# Patient Record
Sex: Female | Born: 1950 | Race: White | Hispanic: No | Smoking: Never smoker
Health system: Southern US, Community
[De-identification: ages and names within clinical notes are randomized; demographics above are authoritative.]

## PROBLEM LIST (undated history)

## (undated) DIAGNOSIS — Z8719 Personal history of other diseases of the digestive system: Secondary | ICD-10-CM

## (undated) DIAGNOSIS — F1190 Opioid use, unspecified, uncomplicated: Secondary | ICD-10-CM

## (undated) DIAGNOSIS — B269 Mumps without complication: Secondary | ICD-10-CM

## (undated) DIAGNOSIS — B019 Varicella without complication: Secondary | ICD-10-CM

## (undated) DIAGNOSIS — I509 Heart failure, unspecified: Secondary | ICD-10-CM

## (undated) DIAGNOSIS — T8859XA Other complications of anesthesia, initial encounter: Secondary | ICD-10-CM

## (undated) DIAGNOSIS — M069 Rheumatoid arthritis, unspecified: Secondary | ICD-10-CM

## (undated) DIAGNOSIS — E785 Hyperlipidemia, unspecified: Secondary | ICD-10-CM

## (undated) DIAGNOSIS — F32A Depression, unspecified: Secondary | ICD-10-CM

## (undated) DIAGNOSIS — L719 Rosacea, unspecified: Secondary | ICD-10-CM

## (undated) DIAGNOSIS — B059 Measles without complication: Secondary | ICD-10-CM

## (undated) DIAGNOSIS — M1990 Unspecified osteoarthritis, unspecified site: Secondary | ICD-10-CM

## (undated) DIAGNOSIS — F419 Anxiety disorder, unspecified: Secondary | ICD-10-CM

## (undated) DIAGNOSIS — N189 Chronic kidney disease, unspecified: Secondary | ICD-10-CM

## (undated) DIAGNOSIS — K259 Gastric ulcer, unspecified as acute or chronic, without hemorrhage or perforation: Secondary | ICD-10-CM

## (undated) DIAGNOSIS — K219 Gastro-esophageal reflux disease without esophagitis: Secondary | ICD-10-CM

## (undated) DIAGNOSIS — G709 Myoneural disorder, unspecified: Secondary | ICD-10-CM

## (undated) DIAGNOSIS — E039 Hypothyroidism, unspecified: Secondary | ICD-10-CM

## (undated) DIAGNOSIS — G2581 Restless legs syndrome: Secondary | ICD-10-CM

## (undated) DIAGNOSIS — M5030 Other cervical disc degeneration, unspecified cervical region: Secondary | ICD-10-CM

## (undated) DIAGNOSIS — I10 Essential (primary) hypertension: Secondary | ICD-10-CM

## (undated) DIAGNOSIS — T402X5A Adverse effect of other opioids, initial encounter: Secondary | ICD-10-CM

## (undated) DIAGNOSIS — M329 Systemic lupus erythematosus, unspecified: Secondary | ICD-10-CM

## (undated) DIAGNOSIS — M810 Age-related osteoporosis without current pathological fracture: Secondary | ICD-10-CM

## (undated) DIAGNOSIS — Z9842 Cataract extraction status, left eye: Secondary | ICD-10-CM

## (undated) DIAGNOSIS — G894 Chronic pain syndrome: Secondary | ICD-10-CM

## (undated) DIAGNOSIS — Z9841 Cataract extraction status, right eye: Secondary | ICD-10-CM

## (undated) DIAGNOSIS — M3500 Sicca syndrome, unspecified: Secondary | ICD-10-CM

## (undated) HISTORY — PX: CHOLECYSTECTOMY: SHX55

## (undated) HISTORY — PX: HERNIA REPAIR: SHX51

## (undated) HISTORY — PX: EYE SURGERY: SHX253

## (undated) HISTORY — PX: OTHER SURGICAL HISTORY: SHX169

## (undated) HISTORY — DX: Rosacea, unspecified: L71.9

## (undated) HISTORY — PX: COLONOSCOPY: SHX174

## (undated) HISTORY — DX: Heart failure, unspecified: I50.9

## (undated) HISTORY — PX: CATARACT EXTRACTION: SUR2

## (undated) HISTORY — PX: UPPER GASTROINTESTINAL ENDOSCOPY: SHX188

## (undated) HISTORY — PX: JOINT REPLACEMENT: SHX530

## (undated) HISTORY — PX: ENDOVENOUS ABLATION SAPHENOUS VEIN W/ LASER: SUR449

## (undated) HISTORY — PX: HIP ARTHROPLASTY: SHX981

## (undated) HISTORY — PX: KNEE ARTHROPLASTY: SHX992

## (undated) HISTORY — PX: CHOLESTEATOMA EXCISION: SHX1345

---

## 1973-11-09 HISTORY — PX: TUBAL LIGATION: SHX77

## 1976-11-09 HISTORY — PX: COMBINED AUGMENTATION MAMMAPLASTY AND ABDOMINOPLASTY: SUR291

## 2002-11-09 HISTORY — PX: BREAST ENHANCEMENT SURGERY: SHX7

## 2005-11-09 HISTORY — PX: LAPAROSCOPIC INCISIONAL / UMBILICAL / VENTRAL HERNIA REPAIR: SUR789

## 2006-11-09 HISTORY — PX: LIMB SPARING RESECTION HIP W/ SADDLE JOINT REPLACEMENT: SUR829

## 2007-11-10 HISTORY — PX: NASAL ENDOSCOPY: SHX286

## 2008-11-09 HISTORY — PX: KNEE ARTHROSCOPY: SUR90

## 2020-01-17 DIAGNOSIS — M810 Age-related osteoporosis without current pathological fracture: Secondary | ICD-10-CM | POA: Insufficient documentation

## 2020-01-17 DIAGNOSIS — G629 Polyneuropathy, unspecified: Secondary | ICD-10-CM | POA: Insufficient documentation

## 2020-01-17 DIAGNOSIS — L930 Discoid lupus erythematosus: Secondary | ICD-10-CM | POA: Insufficient documentation

## 2020-09-06 ENCOUNTER — Emergency Department
Admission: EM | Admit: 2020-09-06 | Discharge: 2020-09-07 | Disposition: A | Discharge status: Home / Self Care | Payer: Medicare Other | Attending: Emergency Medicine | Admitting: Emergency Medicine

## 2020-09-06 ENCOUNTER — Emergency Department: Payer: Medicare Other

## 2020-09-06 ENCOUNTER — Other Ambulatory Visit: Payer: Self-pay

## 2020-09-06 DIAGNOSIS — I10 Essential (primary) hypertension: Secondary | ICD-10-CM | POA: Insufficient documentation

## 2020-09-06 DIAGNOSIS — R109 Unspecified abdominal pain: Secondary | ICD-10-CM | POA: Diagnosis not present

## 2020-09-06 DIAGNOSIS — N939 Abnormal uterine and vaginal bleeding, unspecified: Secondary | ICD-10-CM | POA: Insufficient documentation

## 2020-09-06 LAB — CBC
HCT: 39.8 % (ref 36.0–46.0)
Hemoglobin: 13.1 g/dL (ref 12.0–15.0)
MCH: 30.7 pg (ref 26.0–34.0)
MCHC: 32.9 g/dL (ref 30.0–36.0)
MCV: 93.2 fL (ref 80.0–100.0)
Platelets: 257 10*3/uL (ref 150–400)
RBC: 4.27 MIL/uL (ref 3.87–5.11)
RDW: 14.2 % (ref 11.5–15.5)
WBC: 6.6 10*3/uL (ref 4.0–10.5)
nRBC: 0 % (ref 0.0–0.2)

## 2020-09-06 LAB — CBC WITH DIFFERENTIAL/PLATELET
Abs Immature Granulocytes: 0 10*3/uL (ref 0.00–0.07)
Basophils Absolute: 0.1 10*3/uL (ref 0.0–0.1)
Basophils Relative: 1 %
Eosinophils Absolute: 0.1 10*3/uL (ref 0.0–0.5)
Eosinophils Relative: 2 %
HCT: 40.9 % (ref 36.0–46.0)
Hemoglobin: 13.3 g/dL (ref 12.0–15.0)
Immature Granulocytes: 0 %
Lymphocytes Relative: 26 %
Lymphs Abs: 1.7 10*3/uL (ref 0.7–4.0)
MCH: 30.6 pg (ref 26.0–34.0)
MCHC: 32.5 g/dL (ref 30.0–36.0)
MCV: 94 fL (ref 80.0–100.0)
Monocytes Absolute: 0.3 10*3/uL (ref 0.1–1.0)
Monocytes Relative: 4 %
Neutro Abs: 4.6 10*3/uL (ref 1.7–7.7)
Neutrophils Relative %: 67 %
Platelets: 227 10*3/uL (ref 150–400)
RBC: 4.35 MIL/uL (ref 3.87–5.11)
RDW: 14.3 % (ref 11.5–15.5)
WBC: 6.7 10*3/uL (ref 4.0–10.5)
nRBC: 0 % (ref 0.0–0.2)

## 2020-09-06 LAB — COMPREHENSIVE METABOLIC PANEL
ALT: 16 U/L (ref 0–44)
AST: 23 U/L (ref 15–41)
Albumin: 3.8 g/dL (ref 3.5–5.0)
Alkaline Phosphatase: 49 U/L (ref 38–126)
Anion gap: 10 (ref 5–15)
BUN: 12 mg/dL (ref 8–23)
CO2: 26 mmol/L (ref 22–32)
Calcium: 8.5 mg/dL — ABNORMAL LOW (ref 8.9–10.3)
Chloride: 103 mmol/L (ref 98–111)
Creatinine, Ser: 0.77 mg/dL (ref 0.44–1.00)
GFR, Estimated: >60 mL/min (ref >60)
Glucose, Bld: 82 mg/dL (ref 70–99)
Potassium: 3.9 mmol/L (ref 3.5–5.1)
Sodium: 139 mmol/L (ref 135–145)
Total Bilirubin: 0.7 mg/dL (ref 0.3–1.2)
Total Protein: 6.8 g/dL (ref 6.5–8.1)

## 2020-09-06 LAB — URINALYSIS, COMPLETE (UACMP) WITH MICROSCOPIC
Bacteria, UA: NONE SEEN
Bilirubin Urine: NEGATIVE
Glucose, UA: NEGATIVE mg/dL
Ketones, ur: NEGATIVE mg/dL
Leukocytes,Ua: NEGATIVE
Nitrite: NEGATIVE
Protein, ur: NEGATIVE mg/dL
Specific Gravity, Urine: 1.027 (ref 1.005–1.030)
pH: 6 (ref 5.0–8.0)

## 2020-09-06 LAB — TYPE AND SCREEN
ABO/RH(D): A POS
Antibody Screen: NEGATIVE

## 2020-09-06 LAB — WET PREP, GENITAL
Clue Cells Wet Prep HPF POC: NONE SEEN
Sperm: NONE SEEN
Trich, Wet Prep: NONE SEEN
Yeast Wet Prep HPF POC: NONE SEEN

## 2020-09-06 LAB — PROTIME-INR
INR: 1 (ref 0.8–1.2)
Prothrombin Time: 12.9 seconds (ref 11.4–15.2)

## 2020-09-06 LAB — LIPASE, BLOOD: Lipase: 28 U/L (ref 11–51)

## 2020-09-06 MED ORDER — IBUPROFEN 400 MG PO TABS: 400.0000 mg | ORAL_TABLET | Freq: Once | ORAL | Status: DC

## 2020-09-06 MED ORDER — LACTATED RINGERS IV BOLUS
1000.0000 mL | Freq: Once | INTRAVENOUS | Status: AC
  Administered 2020-09-06: 1000 mL via INTRAVENOUS

## 2020-09-06 MED ORDER — IOHEXOL 300 MG/ML  SOLN
100.0000 mL | Freq: Once | INTRAMUSCULAR | Status: AC | PRN
  Administered 2020-09-06: 100 mL via INTRAVENOUS

## 2020-09-06 NOTE — ED Notes (Signed)
Pt resting in bed, asking for ice chips. Repeat cbc sent. Pt given ice chips per md verbal

## 2020-09-06 NOTE — ED Triage Notes (Signed)
Pt to ED for c/o vaginal bleeding that started this morning. Denies abdominal pain or pain at this time.  C/o weakness.  States she flushed the toilet x10 this morning and "all the blood still wouldn't go down" Pt in NAD, alert and oriented, clear speech

## 2020-09-06 NOTE — ED Provider Notes (Signed)
[facility] Emergency Department Provider Note  ____________________________________________   First MD Initiated Contact with Patient 09/06/20 1547     (approximate)  I have reviewed the triage vital signs and the nursing notes.   HISTORY  Chief Complaint Abdominal Pain and Vaginal Bleeding   HPI Candace Juarez is a 69 y.o. female he recently moved to the [place] area from [place] with a past medical history of RA, Sjogren's syndrome, and anxiety who presents for assessment of vaginal bleeding that began today.  Patient states she was urinating this morning when after she finished urinating she noticed continued bright red blood times vagina into the trouble.  She states that since then she has had some oozing.  No prior similar episodes or precipitating events for patient.  Patient states she has not had any vaginal bleeding since her last menstrual period approximately 20 years ago.  She states that she has had some intermittent suprapubic discomfort over the last several months which he attributed to bowel gas but this has not changed at all in the last several days.  She also states that sometimes she has some urine incontinence and she cannot make it to the bathroom in time but has no other acute symptoms.  Specifically denies any burning with urination, blood in her urine, blood in her stool, constipation, back pain, chest pain, cough, shortness of breath, headache, earache, sore throat, or other acute symptoms.  Denies EtOH use, illicit drug use, tobacco abuse.      History reviewed. No pertinent past medical history.  There are no problems to display for this patient.   History reviewed. No pertinent surgical history.  Prior to Admission medications   Not on File    Allergies Sulfa antibiotics  No family history on file.  Social History Social History   Tobacco Use  . Smoking status: Not on file  Substance Use Topics  . Alcohol use:  Not on file  . Drug use: Not on file    Review of Systems  Review of Systems  Constitutional: Negative for chills and fever.  HENT: Negative for sore throat.   Eyes: Negative for pain.  Respiratory: Negative for cough and stridor.   Cardiovascular: Negative for chest pain.  Gastrointestinal: Positive for abdominal pain ( intermittently over last several months). Negative for vomiting.  Skin: Negative for rash.  Neurological: Negative for seizures, loss of consciousness and headaches.  Psychiatric/Behavioral: Negative for suicidal ideas.  All other systems reviewed and are negative.     ____________________________________________   PHYSICAL EXAM:  VITAL SIGNS: ED Triage Vitals  Enc Vitals Group     BP 09/06/20 1409 (!) 176/90     Pulse Rate 09/06/20 1409 82     Resp 09/06/20 1409 18     Temp 09/06/20 1409 98.6 F (37 C)     Temp Source 09/06/20 1409 Oral     SpO2 09/06/20 1409 99 %     Weight 09/06/20 1410 219 lb (99.3 kg)     Height 09/06/20 1410 5\' 2"  (1.575 m)     Head Circumference --      Peak Flow --      Pain Score 09/06/20 1410 0     Pain Loc --      Pain Edu? --      Excl. in GC? --    Vitals:   09/06/20 2030 09/07/20 0037  BP: (!) 191/101 (!) 154/90  Pulse: (!) 112 90  Resp:  16  Temp:    SpO2: 99% 98%   Physical Exam Vitals and nursing note reviewed. Exam conducted with a chaperone present.  Constitutional:      General: She is not in acute distress.    Appearance: She is well-developed. She is obese.  HENT:     Head: Normocephalic and atraumatic.     Right Ear: External ear normal.     Left Ear: External ear normal.     Nose: Nose normal.  Eyes:     Conjunctiva/sclera: Conjunctivae normal.  Cardiovascular:     Rate and Rhythm: Normal rate and regular rhythm.     Heart sounds: No murmur heard.   Pulmonary:     Effort: Pulmonary effort is normal. No respiratory distress.     Breath sounds: Normal breath sounds.  Abdominal:      Palpations: Abdomen is soft.     Tenderness: There is no abdominal tenderness. There is no right CVA tenderness or left CVA tenderness.  Genitourinary:    Cervix: Cervical bleeding present. No cervical motion tenderness or friability.  Musculoskeletal:     Cervical back: Neck supple.  Skin:    General: Skin is warm and dry.     Capillary Refill: Capillary refill takes less than 2 seconds.  Neurological:     Mental Status: She is alert.      ____________________________________________   LABS (all labs ordered are listed, but only abnormal results are displayed)  Labs Reviewed  WET PREP, GENITAL - Abnormal; Notable for the following components:      Result Value   WBC, Wet Prep HPF POC MANY (*)    All other components within normal limits  COMPREHENSIVE METABOLIC PANEL - Abnormal; Notable for the following components:   Calcium 8.5 (*)    All other components within normal limits  URINALYSIS, COMPLETE (UACMP) WITH MICROSCOPIC - Abnormal; Notable for the following components:   Color, Urine STRAW (*)    APPearance CLEAR (*)    Hgb urine dipstick LARGE (*)    All other components within normal limits  RESPIRATORY PANEL BY RT PCR (FLU A&B, COVID)  LIPASE, BLOOD  CBC  PROTIME-INR  CBC WITH DIFFERENTIAL/PLATELET  TYPE AND SCREEN   ____________________________________________  EKG  Sinus rhythm with a ventricular rate of 87, normal axis, unremarkable intervals, nonspecific change in lead III and some artifact in V1 and lead II without evidence of acute ischemia or other significant underlying arrhythmia. ____________________________________________  RADIOLOGY   Official radiology report(s): US Pelvis Complete  Result Date: 09/06/2020 CLINICAL DATA:  69 year old female with vaginal bleeding. EXAM: TRANSABDOMINAL ULTRASOUND OF PELVIS TECHNIQUE: Transabdominal ultrasound examination of the pelvis was performed including evaluation of the uterus, ovaries, adnexal regions,  and pelvic cul-de-sac. COMPARISON:  CT abdomen pelvis dated 09/06/2020. FINDINGS: Uterus Measurements: 8.0 x 2.7 x 4.7 cm = volume: 53 mL. The uterus is unremarkable as visualized. There is a 10.6 x 4.5 x 6.0 cm hypoechoic collection with no internal vascularity in the endocervical region or vagina corresponding to the collection seen on the prior CT. Evaluation is limited due to suboptimal visualization as the patient refused transvaginal imaging. This collection likely represents hydrocolpos or hematocolpos and concerning for outflow obstruction. There is an apparent 4 by 3 x 4 cm echogenic structure along the posterior wall of the endocervical region or posterior wall of the upper vagina which is poorly visualized. There is associated mass effect and narrowing of the vaginal canal by this mass. Gynecology consult and direct visualization recommended.  Endometrium Thickness: 3 mm.  No focal abnormality visualized. Right ovary Measurements: 3.7 x 2.2 x 2.7 cm = volume: 11 mL. Normal appearance/no adnexal mass. Left ovary Measurements: 3.5 x 3.0 x 3.2 cm = volume: 18 mL. There is a 2.4 x 2.4 x 1.9 cm cyst in the left ovary. Other findings:  No abnormal free fluid. IMPRESSION: Hypoechoic collection in the endocervical region or upper vagina with findings concerning for a degree of outflow obstruction. Apparent ill-defined structure along the posterior wall of the vagina or cervix with associated mass effect. Gynecology consult and direct visualization recommended. Electronically Signed   By: Elgie Collard M.D.   On: 09/06/2020 19:14   CT ABDOMEN PELVIS W CONTRAST  Result Date: 09/06/2020 CLINICAL DATA:  69 year old female with abdominal pain. Vaginal bleeding. EXAM: CT ABDOMEN AND PELVIS WITH CONTRAST TECHNIQUE: Multidetector CT imaging of the abdomen and pelvis was performed using the standard protocol following bolus administration of intravenous contrast. CONTRAST:  OMNIPAQUE IOHEXOL 300 MG/ML  SOLN  COMPARISON:  None. FINDINGS: Lower chest: The visualized lung bases are clear. No intra-abdominal free air or free fluid. Hepatobiliary: The liver is unremarkable. No intrahepatic biliary ductal dilatation. Cholecystectomy. No retained calcified stone noted in the central CBD. Pancreas: Unremarkable. No pancreatic ductal dilatation or surrounding inflammatory changes. Spleen: Normal in size without focal abnormality. Adrenals/Urinary Tract: The adrenal glands are unremarkable. There is no hydronephrosis on either side. There is symmetric enhancement and excretion of contrast by both kidneys. The urinary bladder is unremarkable. Stomach/Bowel: There is moderate stool throughout the colon. There is sigmoid diverticulosis without active inflammatory changes. There is no bowel obstruction or active inflammation. The appendix is normal. Vascular/Lymphatic: Mild aortoiliac atherosclerotic disease. The IVC is unremarkable. No portal venous gas. There is no adenopathy. Reproductive: The uterus is anteverted. There is a 4 x 6 cm low attenuating collection in the lower uterus or within the vagina. This is suboptimally visualized due to streak artifact caused by left hip arthroplasty. This likely represents a hydro or hematometra or hydrocolpos. Pelvic ultrasound may provide better evaluation. There is a 2.5 cm left ovarian cyst. Further evaluation with pelvic ultrasound recommended. Other: Ventral hernia repair mesh. Musculoskeletal: Degenerative changes of the spine. No acute osseous pathology. Total left hip arthroplasty. IMPRESSION: 1. Poorly visualized fluid collection in the lower uterus or in the vagina, likely a hydro or hematocolpos. Correlation with clinical exam and further evaluation with pelvic ultrasound recommended. 2. A 2.5 cm left ovarian cyst. 3. Sigmoid diverticulosis. No bowel obstruction. Normal appendix. 4. Aortic Atherosclerosis (ICD10-I70.0). Electronically Signed   By: Elgie Collard M.D.   On:  09/06/2020 17:30    ____________________________________________   PROCEDURES  Procedure(s) performed (including Critical Care):  .1-3 Lead EKG Interpretation Performed by: Gilles Chiquito, MD Authorized by: Gilles Chiquito, MD     Interpretation: normal     ECG rate assessment: normal     Rhythm: sinus rhythm     Ectopy: none     Conduction: normal       ____________________________________________   INITIAL IMPRESSION / ASSESSMENT AND PLAN / ED COURSE        Patient presents with [place] to history exam for assessment of bright red blood per vagina that began this morning after urinating. Patient is hypertensive with a BP of 176/90 with otherwise stable vital signs on room air.  Exam as above remarkable for some cervical bleeding from the external os.  Differential includes but is not limited to abnormal  uterine bleeding in a postmenopausal patient secondary to malignancy versus polyp versus other etiologies.  No other acute intra-abdominal findings on CT abdomen pelvis including evidence of diverticulitis, appendicitis, pyelonephritis, or other acute process.  Per radiology recommendations ultrasound obtained does show evidence of concern for possible abdominal mass.  Given ongoing bleeding and concern for mass gynecology service was consulted.  I did speak with Dr. Dalbert Garnet who stated she would have her in-house midwife see the patient they would make recommendations.  Dr. Dalbert Garnet did come see the patient at bedside.  In brief she recommended discharge with plan for close outpatient follow-up.  She stated she was worried about malignancy but unable to have a biopsy in the ED.  She really patient is hemodynamically stable for discharge.  On my reassessment patient stated she not have any chest pain shortness of breath or lightheadedness and she was noted to have a normal heart rate and no evidence of hypotension.  In addition repeat hemoglobin was stable when compared to prior.   Given these findings I believe patient is safe for discharge with plan for close outpatient gynecology follow-up.  Patient discharged stable condition.  Strict precautions advised and discussed.  Patient was also advised that her blood pressure was elevated today and this must be rechecked by PCP in the next 5 to 7 days.  ____________________________________________   FINAL CLINICAL IMPRESSION(S) / ED DIAGNOSES  Final diagnoses:  Abnormal uterine bleeding  Hypertension, unspecified type    Medications  ibuprofen (ADVIL) tablet 400 mg (400 mg Oral Refused 09/06/20 2303)  iohexol (OMNIPAQUE) 300 MG/ML solution 100 mL (100 mLs Intravenous Contrast Given 09/06/20 1706)  lactated ringers bolus 1,000 mL (0 mLs Intravenous Stopped 09/06/20 2303)     ED Discharge Orders    None       Note:  This document was prepared using Dragon voice recognition software and may include unintentional dictation errors.   Gilles Chiquito, MD 09/07/20 [phone]

## 2020-09-06 NOTE — Consult Note (Signed)
Consult History and Physical   SERVICE: Gynecology   Patient Name: Candace Juarez Patient MRN:   [id number]  CC: Postmenopausal bleeding  HPI: Candace Juarez is a 69 y.o. G53P1 F who started heavy PMB this morning. Filling a pad an hour, BP elevated and tachycardic, but asx from both of those things. She denies HTN.  Now with dark blood, ranging form coffee grounds to maroon. No bright red.  Specifically denies unintentional weight loss, bloating, abdominal pain, hx of breast, uterine, colon or ovarian cancer. Last colonoscopy >10 yrs ago, last pap smear >10 yrs ago. No abnormal pap smears.  NSVD x1  Last menstrual period at age 51. Not currently sexually active. Occasional diarrhea but nothing consistent.   On po estradiol 2mg  daily and micronized progesterone daily for "overall increased youthfulness"  Hx of RA and Lupus  Review of Systems: positives in bold GEN:   fevers, chills, weight changes, appetite changes, fatigue, night sweats CV:   CP, palpitations PULM:  SOB, cough GI:  abd pain, N/V/D/C GU:  dysuria, urgency, frequency MSK:  arthralgias, myalgias, back pain, swelling SKIN:  rashes, color changes, pallor NEURO:  numbness, weakness, tingling, seizures, dizziness, tremors PSYCH:  depression, anxiety, behavioral problems, confusion  HEME/LYMPH:  easy bruising or bleeding ENDO:  heat/cold intolerance  Past Obstetrical History: OB History   No obstetric history on file.     Past Gynecologic History: No LMP recorded. Patient is postmenopausal.   Past Medical History: RA, lupus, and hypothyroidism  Past Surgical History:  History reviewed. No pertinent surgical history.  Family History:  family history is not on file.  Social History:  Social History   Socioeconomic History  . Marital status: Divorced    Spouse name: Not on file  . Number of children: Not on file  . Years of education: Not on file  . Highest education level: Not on file   Occupational History  . Not on file  Tobacco Use  . Smoking status: Not on file  Substance and Sexual Activity  . Alcohol use: Not on file  . Drug use: Not on file  . Sexual activity: Not on file  Other Topics Concern  . Not on file  Social History Narrative  . Not on file   Social Determinants of Health   Financial Resource Strain:   . Difficulty of Paying Living Expenses: Not on file  Food Insecurity:   . Worried About Programme researcher, broadcasting/film/video in the Last Year: Not on file  . Ran Out of Food in the Last Year: Not on file  Transportation Needs:   . Lack of Transportation (Medical): Not on file  . Lack of Transportation (Non-Medical): Not on file  Physical Activity:   . Days of Exercise per Week: Not on file  . Minutes of Exercise per Session: Not on file  Stress:   . Feeling of Stress : Not on file  Social Connections:   . Frequency of Communication with Friends and Family: Not on file  . Frequency of Social Gatherings with Friends and Family: Not on file  . Attends Religious Services: Not on file  . Active Member of Clubs or Organizations: Not on file  . Attends Banker Meetings: Not on file  . Marital Status: Not on file  Intimate Partner Violence:   . Fear of Current or Ex-Partner: Not on file  . Emotionally Abused: Not on file  . Physically Abused: Not on file  . Sexually Abused: Not on  file    Home Medications:  Medications reconciled in EPIC  No current facility-administered medications on file prior to encounter.   No current outpatient medications on file prior to encounter.    Allergies:  Allergies  Allergen Reactions  . Sulfa Antibiotics     rash    Physical Exam:  Temp:  [98.6 F (37 C)] 98.6 F (37 C) (10/29 1409) Pulse Rate:  [82-118] 108 (10/29 1930) Resp:  [15-18] 15 (10/29 1810) BP: (172-181)/(90-113) 181/102 (10/29 1930) SpO2:  [99 %-100 %] 100 % (10/29 1930) Weight:  [99.3 kg] 99.3 kg (10/29 1410)   General Appearance:   Well developed, well nourished, no acute distress, alert and oriented x3 HEENT:  Normocephalic atraumatic, extraocular movements intact, moist mucous membranes Cardiovascular:  Normal S1/S2, regular rate and rhythm, no murmurs Pulmonary:  clear to auscultation, no wheezes, rales or rhonchi, symmetric air entry, good air exchange Abdomen:  Bowel sounds present, soft, nontender, nondistended, no abnormal masses, no epigastric pain Extremities:  Full range of motion, no pedal edema, 2+ distal pulses, no tenderness Skin:  normal coloration and turgor, no rashes, no suspicious skin lesions noted  Psychiatric:  Normal mood and affect, appropriate, no AH/VH Pelvic:  NEFG, no vulvar masses or lesions, normal vaginal mucosa, minimal dark blood in vault, cervix without lesions or erythema but +purplish and bulging, external os not visualized, no visible verrucal masses or growths, bimanual exam limited by patient discomfort. The cervix appears to be the smooth distended mass I visualized, but it is possible this area is the growth seen on ultrasound. Pain is vaginal and muscular, and not cervical tenderness.   Labs/Studies:   CBC and Coags:  Lab Results  Component Value Date   WBC 6.7 09/06/2020   NEUTOPHILPCT 67 09/06/2020   EOSPCT 2 09/06/2020   BASOPCT 1 09/06/2020   LYMPHOPCT 26 09/06/2020   HGB 13.3 09/06/2020   HCT 40.9 09/06/2020   MCV 94.0 09/06/2020   PLT 227 09/06/2020   INR 1.0 09/06/2020   CMP:  Lab Results  Component Value Date   NA 139 09/06/2020   K 3.9 09/06/2020   CL 103 09/06/2020   CO2 26 09/06/2020   BUN 12 09/06/2020   CREATININE 0.77 09/06/2020   PROT 6.8 09/06/2020   BILITOT 0.7 09/06/2020   ALT 16 09/06/2020   AST 23 09/06/2020   ALKPHOS 49 09/06/2020    Other Imaging: US Pelvis Complete  Result Date: 09/06/2020 CLINICAL DATA:  69 year old female with vaginal bleeding. EXAM: TRANSABDOMINAL ULTRASOUND OF PELVIS TECHNIQUE: Transabdominal ultrasound  examination of the pelvis was performed including evaluation of the uterus, ovaries, adnexal regions, and pelvic cul-de-sac. COMPARISON:  CT abdomen pelvis dated 09/06/2020. FINDINGS: Uterus Measurements: 8.0 x 2.7 x 4.7 cm = volume: 53 mL. The uterus is unremarkable as visualized. There is a 10.6 x 4.5 x 6.0 cm hypoechoic collection with no internal vascularity in the endocervical region or vagina corresponding to the collection seen on the prior CT. Evaluation is limited due to suboptimal visualization as the patient refused transvaginal imaging. This collection likely represents hydrocolpos or hematocolpos and concerning for outflow obstruction. There is an apparent 4 by 3 x 4 cm echogenic structure along the posterior wall of the endocervical region or posterior wall of the upper vagina which is poorly visualized. There is associated mass effect and narrowing of the vaginal canal by this mass. Gynecology consult and direct visualization recommended. Endometrium Thickness: 3 mm.  No focal abnormality visualized. Right ovary Measurements:  3.7 x 2.2 x 2.7 cm = volume: 11 mL. Normal appearance/no adnexal mass. Left ovary Measurements: 3.5 x 3.0 x 3.2 cm = volume: 18 mL. There is a 2.4 x 2.4 x 1.9 cm cyst in the left ovary. Other findings:  No abnormal free fluid. IMPRESSION: Hypoechoic collection in the endocervical region or upper vagina with findings concerning for a degree of outflow obstruction. Apparent ill-defined structure along the posterior wall of the vagina or cervix with associated mass effect. Gynecology consult and direct visualization recommended. Electronically Signed   By: Elgie Collard M.D.   On: 09/06/2020 19:14   CT ABDOMEN PELVIS W CONTRAST  Result Date: 09/06/2020 CLINICAL DATA:  69 year old female with abdominal pain. Vaginal bleeding. EXAM: CT ABDOMEN AND PELVIS WITH CONTRAST TECHNIQUE: Multidetector CT imaging of the abdomen and pelvis was performed using the standard protocol  following bolus administration of intravenous contrast. CONTRAST:  OMNIPAQUE IOHEXOL 300 MG/ML  SOLN COMPARISON:  None. FINDINGS: Lower chest: The visualized lung bases are clear. No intra-abdominal free air or free fluid. Hepatobiliary: The liver is unremarkable. No intrahepatic biliary ductal dilatation. Cholecystectomy. No retained calcified stone noted in the central CBD. Pancreas: Unremarkable. No pancreatic ductal dilatation or surrounding inflammatory changes. Spleen: Normal in size without focal abnormality. Adrenals/Urinary Tract: The adrenal glands are unremarkable. There is no hydronephrosis on either side. There is symmetric enhancement and excretion of contrast by both kidneys. The urinary bladder is unremarkable. Stomach/Bowel: There is moderate stool throughout the colon. There is sigmoid diverticulosis without active inflammatory changes. There is no bowel obstruction or active inflammation. The appendix is normal. Vascular/Lymphatic: Mild aortoiliac atherosclerotic disease. The IVC is unremarkable. No portal venous gas. There is no adenopathy. Reproductive: The uterus is anteverted. There is a 4 x 6 cm low attenuating collection in the lower uterus or within the vagina. This is suboptimally visualized due to streak artifact caused by left hip arthroplasty. This likely represents a hydro or hematometra or hydrocolpos. Pelvic ultrasound may provide better evaluation. There is a 2.5 cm left ovarian cyst. Further evaluation with pelvic ultrasound recommended. Other: Ventral hernia repair mesh. Musculoskeletal: Degenerative changes of the spine. No acute osseous pathology. Total left hip arthroplasty. IMPRESSION: 1. Poorly visualized fluid collection in the lower uterus or in the vagina, likely a hydro or hematocolpos. Correlation with clinical exam and further evaluation with pelvic ultrasound recommended. 2. A 2.5 cm left ovarian cyst. 3. Sigmoid diverticulosis. No bowel obstruction. Normal  appendix. 4. Aortic Atherosclerosis (ICD10-I70.0). Electronically Signed   By: Elgie Collard M.D.   On: 09/06/2020 17:30     Assessment / Plan:   Alissia Avallone is a 69 y.o. F G1P1 with PMB, concern for malignancy with a 4x3cm mass at the top of the vagina. Thin ES. However, she is on high dose po estrogen, but small amount of progesterone too.  1. Pap smear collected today but unable to be ordered through the Epic system and pathology is not answering the phone for help. Will take this sample back to the office.  2. Will attempt EMBx in the office with stirrups and pain management. If unnsuccesful, will plan for [facility] hysteroscopy urgently  3. Bleeding precautions given. Not anemic, not bleeding currently but will likely continue intermittently. Until dx, this sx will continue for now.  4. Please have patient call our office at [phone] on Monday. Will plan to see in office urgently.   Thank you for the opportunity to be involved with this pt's care.

## 2020-09-06 NOTE — ED Triage Notes (Signed)
Pt comes into the ED via [facility] from home  C/o vaginal hemorrhage.  Pt also explains she has abdominal pain.  Pt is hypertensive but other vitals stable.  Denies any SHOB, dizziness, palor color.  Known Lupus and RA.  CBG 107, NSR. 20g R Hand

## 2020-09-07 NOTE — Progress Notes (Signed)
CH encountered pt.'s dtr. while passing by rm. in ED; pt. reported she has not had any food all day and is waiting for discharge --> requests crackers.  CH consulted w/MD and obtained permission.  Pt. and dtr. very grateful; they express their understanding that ED staff must triage patients coming to ED but also spoke to the difficulty of having to wait a long time today.  CH remains available as needed.

## 2020-10-10 NOTE — H&P (Signed)
Chief Complaint:   Candace Juarez is a 69 y.o. female presenting with No chief complaint on file.   Body mass index is 40.79 kg/m.  History of Present Illness: Patient presents for a preoperative visit for a scheduled Total Laparoscopic Hysterectomy with Bilateral Salpingo Oophorectomy and Anterior Repair. Surgical indications: postmenopausal bleeding, suspected hematocolpos or hematometra, complex left ovarian cyst, anterior prolapse. Patient has a history of long-term use of HRT, po estradiol 2mg  & 100mg  progesterone; plan is to decrease estrogen and discontinue progesterone after hysterectomy to decrease risks.  Workup: Pap 09/2020 neg/neg  EMBx 09/09/20: Disordered proliferative endometrium with breakdown changes. No hyperplasia or carcinoma.  TAUS 09/06/20:  Ut: 8.0 x 2.7 x 4.7 cm = volume: 53 mL.Uterus unremarkable.There is a 10.6 x 4.5 x 6.0 cm hypoechoic collection w/no internal vascularity in the endocervical region or vagina corresponding to the collection seen on prior CT. Evaluation limited d/tsuboptimal visualization as the pt refused transvaginal imaging. This collection likely represents hydrocolpos or hematocolpos and concerning for outflow obstruction. There is an apparent 4 by 3 x 4 cm echogenic structure along the posterior wall of the endocervical region or posterior wall of the upper vagina which is poorly visualized. There is associated mass effect and narrowing of the vaginal canal by this mass. Endometrium Thickness: 3 mm. No focal abnormality visualized. RO: 3.7 x 2.2 x 2.7 cm = volume: 11 mL. Normal appearance/no adnexal mass. LO: 3.5 x 3.0 x 3.2 cm = volume: 18 mL. There is a 2.4 x 2.4 x 1.9 cm cyst in the left ovary.  TVUS 09/10/20: Uterusanteverted=7.07 x 2.91 x 4.67cm;Endometrium=12.42mm Small amt of fluid seen in cervix,No free fluid seen Lt complex septated ovarian cyst=3.6cm; septation=0.13cm, solid area within=1.16 x 0.40 x 1.19cm Lt ovary  volume=15.53ml;Rt ovary appears wnl  CT of abd & pelvis10/29/2021: Reproductive:Uterus is anteverted. There is a 4 x 6 cm low attenuating collection in the lower uterus or within the vagina. This is suboptimally visualized d/tstreak artifact caused by left hip arthroplasty. This likely represents a hydro or hematometra or hydrocolpos. There is a 2.5 cm left ovarian cyst  Surgical Pertinent Hx: -Lap BTL -Abdominoplasty  -Lap incisional/umbilical/ventral hernia repairr  Past Medical History:  has a past medical history of Anxiety, Chicken pox, Depression, History of stomach ulcers, Hypertension, Lupus (CMS-HCC), Measles, Mumps, Rheumatoid arthritis (CMS-HCC), Thyroid disease, and Thyroid disease.  Past Surgical History:  has a past surgical history that includes Laparoscopic tubal ligation; Combined augmentation mammaplasty and abdominoplasty (1978); Limb sparing resection hip w/ saddle joint replacement (2008); Cholecystectomy; knee surgery (2010); Laparoscopic incisional / umbilical / ventral hernia repair (2007); left hip 2nd replacement (2011); Colonoscopy (2007); Nasal Endoscopy (2009); right ear cholesteoma (2006); Breast surgery (1978, 2004); Cataract extraction; and Endovenous ablation saphenous vein w/ laser. Family History: family history includes Diabetes in her maternal grandmother; Emphysema in her father; Esophageal cancer in her mother; Heart disease in her father and maternal grandmother; Other in her brother. Social History:  reports that she has never smoked. She has never used smokeless tobacco. She reports previous alcohol use. She reports that she does not use drugs. OB/GYN History:          OB History    Gravida  1   Para      Term      Preterm      AB      Living  1     SAB      IAB      Ectopic  Molar      Multiple      Live Births           Allergies: is allergic to sulfur. Medications: Current Outpatient Medications:  .   ALPRAZolam (XANAX) 0.5 MG tablet, Take 0.5 mg by mouth nightly as needed for Sleep, Disp: , Rfl:  .  denosumab (PROLIA SUBQ), Inject subcutaneously, Disp: , Rfl:  .  diphenoxylate-atropine (LOMOTIL) 2.5-0.025 mg tablet, diphenoxylate-atropine 2.5 mg-0.025 mg tablet  TAKE 1 TABLET BY MOUTH FOUR TIMES DAILY AS NEEDED, Disp: , Rfl:  .  estradioL (ESTRACE) 2 MG tablet, Take 2 mg by mouth once daily, Disp: , Rfl:  .  fluticasone propionate (FLONASE) 50 mcg/actuation nasal spray, Place 2 sprays into both nostrils once daily, Disp: , Rfl:  .  HYDROcodone-acetaminophen (NORCO) 7.5-325 mg tablet, Take 1 tablet by mouth every 6 (six) hours as needed for Pain, Disp: , Rfl:  .  hydrOXYchloroQUINE (PLAQUENIL) 200 mg tablet, Take 200 mg by mouth 2 (two) times daily, Disp: , Rfl:  .  levothyroxine (SYNTHROID) 125 MCG tablet, TAKE 1 TABLET BY MOUTH EVERY DAY IN THE MORNING ON AN EMPTY STOMACH, Disp: , Rfl:  .  montelukast (SINGULAIR) 10 mg tablet, Take 10 mg by mouth every evening, Disp: , Rfl:  .  nystatin (MYCOSTATIN) 100,000 unit/gram powder, Apply topically 2 (two) times daily As needed to affected area, Disp: 30 g, Rfl: 0 .  progesterone (PROMETRIUM) 100 MG capsule, Take 100 mg by mouth once daily, Disp: , Rfl:  .  solifenacin (VESICARE) 5 MG tablet, TAKE 1 TABLET(5 MG) BY MOUTH EVERY DAY, Disp: 90 tablet, Rfl: 1 .  venlafaxine (EFFEXOR-XR) 75 MG XR capsule, Take 75 mg by mouth once daily, Disp: , Rfl:   Review of Systems: No SOB, no palpitations or chest pain, no new lower extremity edema, no nausea or vomiting or bowel or bladder complaints. See HPI for gyn specific ROS.   Exam:   BP 142/80   Ht 157.5 cm ( )   Wt (!) 101.2 kg (223 lb)   BMI 40.79 kg/m   Constitutional:  General appearance: Well nourished, well developed female in no acute distress.  Neuro/psych:  Normal mood and affect. No gross motor deficits. Neck:  Supple, normal appearance.  Respiratory:  Normal respiratory effort, no  use of accessory muscles Skin:  No visible rashes or external lesions  Impression:   The primary encounter diagnosis was Postmenopausal bleeding. Diagnoses of Complex cyst of left ovary, Endometrial thickening on ultrasound, and Female cystocele were also pertinent to this visit.  Plan:   1. Postmenopausal Bleeding, Thickened Endometrium, Complex Left Ovarian Cyst  -Patient returns for a preoperative discussion regarding her plans to proceed with surgical treatment of her postmenopausal bleeding, thickened endometrium, complex left ovarian cyst by total laparoscopic hysterectomy with bilateral salpingo oophorectomy and anterior repair procedure. We may perform a cystoscopy to evaluate the urinary tract after the procedure, if surgically indicated for uro tract integrity.   The patient and I discussed the technical aspects of the procedure including the potential for risks and complications. These include but are not limited to the risk of infection requiring post-operative antibiotics or further procedures. We talked about the risk of injury to adjacent organs including bladder, bowel, ureter, blood vessels or nerves. We talked about the need to convert to an open incision. We talked about the possible need for blood transfusion. We talked aboutpostop complications such asthromboembolic or cardiopulmonary complications. All of her questions were answered.  Her preoperative exam was completed and the appropriate consents were signed. She is scheduled to undergo this procedure in the near future.  Patient is currently taking 7.5-325 mg hydrocodone-acetominophen [facility]) usually 2 times daily. Will plan for 4x daily for her to use in a scheduled way for 3 days, and I will write that for her.  Specific Peri-operative Considerations:  - Consent: obtained today - Health Maintenance: up to date - Labs: CBC, CMP preoperatively - Studies: EKG, CXR preoperatively - Bowel Preparation: None  required - Abx:  Ancef 2g - VTE ppx: SCDs perioperatively - Glucose Protocol: n/a - Beta-blockade: n/a  Patient states that in the past, she has been told about an ibuprofen or pain med that is a coated capsule. She thinks the name was "Gentry Roch" or something like that, and I will check at the time of surgery with the pharmacy  Diagnoses and all orders for this visit:  Postmenopausal bleeding  Complex cyst of left ovary  Anterior prolapse

## 2020-10-14 ENCOUNTER — Encounter
Admission: RE | Admit: 2020-10-14 | Discharge: 2020-10-14 | Disposition: A | Discharge status: Home / Self Care | Payer: Medicare Other | Source: Ambulatory Visit | Attending: Obstetrics and Gynecology | Admitting: Obstetrics and Gynecology

## 2020-10-14 ENCOUNTER — Other Ambulatory Visit: Payer: Self-pay

## 2020-10-14 HISTORY — DX: Personal history of other diseases of the digestive system: Z87.19

## 2020-10-14 HISTORY — DX: Systemic lupus erythematosus, unspecified: M32.9

## 2020-10-14 HISTORY — DX: Hypothyroidism, unspecified: E03.9

## 2020-10-14 HISTORY — DX: Unspecified osteoarthritis, unspecified site: M19.90

## 2020-10-14 HISTORY — DX: Rheumatoid arthritis, unspecified: M06.9

## 2020-10-14 NOTE — Patient Instructions (Addendum)
Your procedure is scheduled on: 10/24/20 Report to DAY SURGERY DEPARTMENT LOCATED ON 2ND FLOOR MEDICAL MALL ENTRANCE must check in at Admitting desk first. To find out your arrival time please call [phone] between 1PM - 3PM on 10/23/20.  Remember: Instructions that are not followed completely may result in serious medical risk, up to and including death, or upon the discretion of your surgeon and anesthesiologist your surgery may need to be rescheduled.     _X__ 1. Do not eat food after midnight the night before your procedure.                 No gum chewing or hard candies. You may drink clear liquids up to 2 hours                 before you are scheduled to arrive for your surgery- DO not drink clear                 liquids within 2 hours of the start of your surgery.                 Clear Liquids include:  water, apple juice without pulp, clear carbohydrate                 drink such as Clearfast or Gatorade, Black Coffee or Tea (Do not add                 anything to coffee or tea). Diabetics water only  __X__2.  On the morning of surgery brush your teeth with toothpaste and water, you                 may rinse your mouth with mouthwash if you wish.  Do not swallow any              toothpaste of mouthwash.     _X__ 3.  No Alcohol for 24 hours before or after surgery.   _X__ 4.  Do Not Smoke or use e-cigarettes For 24 Hours Prior to Your Surgery.                 Do not use any chewable tobacco products for at least 6 hours prior to                 surgery.  ____  5.  Bring all medications with you on the day of surgery if instructed.   __X__  6.  Notify your doctor if there is any change in your medical condition      (cold, fever, infections).     Do not wear jewelry, make-up, hairpins, clips or nail polish. Do not wear lotions, powders, or perfumes.  Do not shave 48 hours prior to surgery. Men may shave face and neck. Do not bring valuables to the hospital.    [facility]  is not responsible for any belongings or valuables.  Contacts, dentures/partials or body piercings may not be worn into surgery. Bring a case for your contacts, glasses or hearing aids, a denture cup will be supplied. Leave your suitcase in the car. After surgery it may be brought to your room. For patients admitted to the hospital, discharge time is determined by your treatment team.   Patients discharged the day of surgery will not be allowed to drive home.   Please read over the following fact sheets that you were given:   MRSA Information  __X__ Take these medicines the morning of surgery  with A SIP OF WATER:    1. levothyroxine (SYNTHROID) 125 MCG tablet  2. solifenacin (VESICARE) 5 MG tablet  3. venlafaxine XR (EFFEXOR-XR) 75 MG 24 hr capsule  4. HYDROcodone-acetaminophen (NORCO) 7.5-325 MG tablet  5.  6.  ____ Fleet Enema (as directed)   __X__ Use CHG Soap/SAGE wipes as directed  ____ Use inhalers on the day of surgery  ____ Stop metformin/Janumet/Farxiga 2 days prior to surgery    ____ Take 1/2 of usual insulin dose the night before surgery. No insulin the morning          of surgery.   ____ Stop Blood Thinners Coumadin/Plavix/Xarelto/Pleta/Pradaxa/Eliquis/Effient/Aspirin  on   Or contact your Surgeon, Cardiologist or Medical Doctor regarding  ability to stop your blood thinners  __X__ Stop Anti-inflammatories 7 days before surgery such as Advil, Ibuprofen, Motrin,  BC or Goodies Powder, Naprosyn, Naproxen, Aleve, Aspirin    __X__ Stop all herbal supplements, fish oil or vitamin E until after surgery.    ____ Bring C-Pap to the hospital.    Drink the Ensure "Clear" Pre Surgery Drink 2 hours before arriving

## 2020-10-22 ENCOUNTER — Other Ambulatory Visit
Admission: RE | Admit: 2020-10-22 | Discharge: 2020-10-22 | Disposition: A | Discharge status: Home / Self Care | Payer: Medicare Other | Source: Ambulatory Visit | Attending: Obstetrics and Gynecology | Admitting: Obstetrics and Gynecology

## 2020-10-22 ENCOUNTER — Other Ambulatory Visit: Payer: Self-pay

## 2020-10-22 DIAGNOSIS — Z20822 Contact with and (suspected) exposure to covid-19: Secondary | ICD-10-CM | POA: Diagnosis not present

## 2020-10-22 DIAGNOSIS — Z01812 Encounter for preprocedural laboratory examination: Secondary | ICD-10-CM | POA: Diagnosis present

## 2020-10-22 LAB — TYPE AND SCREEN
ABO/RH(D): A POS
Antibody Screen: NEGATIVE

## 2020-10-22 LAB — SARS CORONAVIRUS 2 (TAT 6-24 HRS): SARS Coronavirus 2: NEGATIVE

## 2020-10-24 ENCOUNTER — Observation Stay: Payer: Medicare Other | Admitting: Urgent Care

## 2020-10-24 ENCOUNTER — Encounter
Admission: RE | Disposition: A | Discharge status: Home / Self Care | Payer: Self-pay | Source: Home / Self Care | Attending: Obstetrics and Gynecology

## 2020-10-24 ENCOUNTER — Observation Stay
Admission: RE | Admit: 2020-10-24 | Discharge: 2020-10-25 | Disposition: A | Discharge status: Home / Self Care | Payer: Medicare Other | Attending: Obstetrics and Gynecology | Admitting: Obstetrics and Gynecology

## 2020-10-24 ENCOUNTER — Encounter: Payer: Self-pay | Admitting: Obstetrics and Gynecology

## 2020-10-24 ENCOUNTER — Other Ambulatory Visit: Payer: Self-pay

## 2020-10-24 DIAGNOSIS — N950 Postmenopausal bleeding: Secondary | ICD-10-CM | POA: Diagnosis present

## 2020-10-24 DIAGNOSIS — Z79899 Other long term (current) drug therapy: Secondary | ICD-10-CM | POA: Diagnosis not present

## 2020-10-24 DIAGNOSIS — N83292 Other ovarian cyst, left side: Secondary | ICD-10-CM | POA: Diagnosis not present

## 2020-10-24 DIAGNOSIS — R9389 Abnormal findings on diagnostic imaging of other specified body structures: Secondary | ICD-10-CM | POA: Diagnosis not present

## 2020-10-24 DIAGNOSIS — I10 Essential (primary) hypertension: Secondary | ICD-10-CM | POA: Insufficient documentation

## 2020-10-24 HISTORY — PX: TOTAL LAPAROSCOPIC HYSTERECTOMY WITH BILATERAL SALPINGO OOPHORECTOMY: SHX6845

## 2020-10-24 HISTORY — PX: CYSTOCELE REPAIR: SHX163

## 2020-10-24 SURGERY — HYSTERECTOMY, TOTAL, LAPAROSCOPIC, WITH BILATERAL SALPINGO-OOPHORECTOMY
Anesthesia: General

## 2020-10-24 MED ORDER — MENTHOL 3 MG MT LOZG
1.0000 | LOZENGE | OROMUCOSAL | Status: DC | PRN
  Filled 2020-10-24: qty 9

## 2020-10-24 MED ORDER — DOCUSATE SODIUM 100 MG PO CAPS
100.0000 mg | ORAL_CAPSULE | Freq: Two times a day (BID) | ORAL | Status: DC
  Administered 2020-10-24 – 2020-10-25 (×2): 100 mg via ORAL
  Filled 2020-10-24 (×2): qty 1

## 2020-10-24 MED ORDER — ONDANSETRON HCL 4 MG PO TABS: 4.0000 mg | ORAL_TABLET | Freq: Four times a day (QID) | ORAL | Status: DC | PRN

## 2020-10-24 MED ORDER — PROPOFOL 10 MG/ML IV BOLUS
INTRAVENOUS | Status: AC
  Filled 2020-10-24: qty 20

## 2020-10-24 MED ORDER — ACETAMINOPHEN 500 MG PO TABS
ORAL_TABLET | ORAL | Status: AC
  Administered 2020-10-24: 07:00:00 1000 mg via ORAL
  Filled 2020-10-24: qty 2

## 2020-10-24 MED ORDER — ACETAMINOPHEN 500 MG PO TABS: 1000.0000 mg | ORAL_TABLET | ORAL | Status: AC

## 2020-10-24 MED ORDER — PROPOFOL 500 MG/50ML IV EMUL
INTRAVENOUS | Status: DC | PRN
  Administered 2020-10-24: 120 ug/kg/min via INTRAVENOUS

## 2020-10-24 MED ORDER — HYDROCODONE-ACETAMINOPHEN 7.5-325 MG PO TABS: 1.0000 | ORAL_TABLET | Freq: Four times a day (QID) | ORAL | Status: DC | PRN

## 2020-10-24 MED ORDER — BUPIVACAINE HCL 0.5 % IJ SOLN
INTRAMUSCULAR | Status: DC | PRN
  Administered 2020-10-24: 17 mL

## 2020-10-24 MED ORDER — FAMOTIDINE 20 MG PO TABS: 20.0000 mg | ORAL_TABLET | Freq: Once | ORAL | Status: AC

## 2020-10-24 MED ORDER — OXYCODONE HCL 5 MG PO TABS: 5.0000 mg | ORAL_TABLET | Freq: Once | ORAL | Status: DC | PRN

## 2020-10-24 MED ORDER — GABAPENTIN 100 MG PO CAPS
100.0000 mg | ORAL_CAPSULE | Freq: Every day | ORAL | Status: DC
  Administered 2020-10-24: 22:00:00 100 mg via ORAL
  Filled 2020-10-24: qty 1

## 2020-10-24 MED ORDER — ROCURONIUM BROMIDE 10 MG/ML (PF) SYRINGE
PREFILLED_SYRINGE | INTRAVENOUS | Status: DC | PRN
  Administered 2020-10-24: 40 mg via INTRAVENOUS
  Administered 2020-10-24: 10 mg via INTRAVENOUS
  Administered 2020-10-24 (×3): 20 mg via INTRAVENOUS
  Administered 2020-10-24: 10 mg via INTRAVENOUS

## 2020-10-24 MED ORDER — DEXAMETHASONE SODIUM PHOSPHATE 10 MG/ML IJ SOLN
INTRAMUSCULAR | Status: DC | PRN
  Administered 2020-10-24: 10 mg via INTRAVENOUS

## 2020-10-24 MED ORDER — DEXMEDETOMIDINE (PRECEDEX) IN NS 20 MCG/5ML (4 MCG/ML) IV SYRINGE
PREFILLED_SYRINGE | INTRAVENOUS | Status: AC
  Filled 2020-10-24: qty 5

## 2020-10-24 MED ORDER — PROPOFOL 500 MG/50ML IV EMUL
INTRAVENOUS | Status: AC
  Filled 2020-10-24: qty 50

## 2020-10-24 MED ORDER — DIPHENOXYLATE-ATROPINE 2.5-0.025 MG PO TABS: 1.0000 | ORAL_TABLET | Freq: Every day | ORAL | Status: DC | PRN

## 2020-10-24 MED ORDER — BUPIVACAINE HCL (PF) 0.5 % IJ SOLN
INTRAMUSCULAR | Status: AC
  Filled 2020-10-24: qty 30

## 2020-10-24 MED ORDER — FENTANYL CITRATE (PF) 100 MCG/2ML IJ SOLN: 25.0000 ug | INTRAMUSCULAR | Status: DC | PRN

## 2020-10-24 MED ORDER — VENLAFAXINE HCL ER 75 MG PO CP24
75.0000 mg | ORAL_CAPSULE | Freq: Every day | ORAL | Status: DC
  Administered 2020-10-25: 09:00:00 75 mg via ORAL
  Filled 2020-10-24 (×2): qty 1

## 2020-10-24 MED ORDER — LORATADINE 10 MG PO TABS
10.0000 mg | ORAL_TABLET | Freq: Every day | ORAL | Status: DC
  Administered 2020-10-25: 09:00:00 10 mg via ORAL
  Filled 2020-10-24 (×2): qty 1

## 2020-10-24 MED ORDER — OXYCODONE HCL 5 MG/5ML PO SOLN: 5.0000 mg | Freq: Once | ORAL | Status: DC | PRN

## 2020-10-24 MED ORDER — LIDOCAINE-EPINEPHRINE 1 %-1:100000 IJ SOLN
INTRAMUSCULAR | Status: AC
  Filled 2020-10-24: qty 1

## 2020-10-24 MED ORDER — LABETALOL HCL 5 MG/ML IV SOLN
INTRAVENOUS | Status: DC | PRN
  Administered 2020-10-24 (×4): 5 mg via INTRAVENOUS

## 2020-10-24 MED ORDER — DIPHENHYDRAMINE HCL 25 MG PO CAPS: 25.0000 mg | ORAL_CAPSULE | Freq: Every evening | ORAL | Status: DC | PRN

## 2020-10-24 MED ORDER — LACTATED RINGERS IV SOLN: INTRAVENOUS | Status: DC

## 2020-10-24 MED ORDER — ESTRADIOL 1 MG PO TABS
2.0000 mg | ORAL_TABLET | Freq: Every day | ORAL | Status: DC
  Administered 2020-10-24 – 2020-10-25 (×2): 2 mg via ORAL
  Filled 2020-10-24 (×2): qty 2

## 2020-10-24 MED ORDER — CEFAZOLIN SODIUM-DEXTROSE 2-4 GM/100ML-% IV SOLN
INTRAVENOUS | Status: AC
  Filled 2020-10-24: qty 100

## 2020-10-24 MED ORDER — FAMOTIDINE 20 MG PO TABS
ORAL_TABLET | ORAL | Status: AC
  Administered 2020-10-24: 07:00:00 20 mg via ORAL
  Filled 2020-10-24: qty 1

## 2020-10-24 MED ORDER — POLYVINYL ALCOHOL 1.4 % OP SOLN
Freq: Every day | OPHTHALMIC | Status: DC | PRN
  Filled 2020-10-24: qty 15

## 2020-10-24 MED ORDER — ONDANSETRON HCL 4 MG/2ML IJ SOLN
INTRAMUSCULAR | Status: DC | PRN
  Administered 2020-10-24: 4 mg via INTRAVENOUS

## 2020-10-24 MED ORDER — DARIFENACIN HYDROBROMIDE ER 7.5 MG PO TB24
7.5000 mg | ORAL_TABLET | Freq: Every day | ORAL | Status: DC
  Administered 2020-10-25: 09:00:00 7.5 mg via ORAL
  Filled 2020-10-24 (×2): qty 1

## 2020-10-24 MED ORDER — PROPOFOL 10 MG/ML IV BOLUS
INTRAVENOUS | Status: DC | PRN
  Administered 2020-10-24: 160 mg via INTRAVENOUS

## 2020-10-24 MED ORDER — KETOROLAC TROMETHAMINE 30 MG/ML IJ SOLN
INTRAMUSCULAR | Status: AC
  Filled 2020-10-24: qty 1

## 2020-10-24 MED ORDER — CHLORHEXIDINE GLUCONATE 0.12 % MT SOLN
OROMUCOSAL | Status: AC
  Administered 2020-10-24: 07:00:00 15 mL via OROMUCOSAL
  Filled 2020-10-24: qty 15

## 2020-10-24 MED ORDER — FENTANYL CITRATE (PF) 100 MCG/2ML IJ SOLN
INTRAMUSCULAR | Status: DC | PRN
  Administered 2020-10-24: 25 ug via INTRAVENOUS
  Administered 2020-10-24: 50 ug via INTRAVENOUS
  Administered 2020-10-24: 75 ug via INTRAVENOUS
  Administered 2020-10-24 (×2): 50 ug via INTRAVENOUS

## 2020-10-24 MED ORDER — HYDROXYCHLOROQUINE SULFATE 200 MG PO TABS
200.0000 mg | ORAL_TABLET | Freq: Two times a day (BID) | ORAL | Status: DC
  Administered 2020-10-24 – 2020-10-25 (×2): 200 mg via ORAL
  Filled 2020-10-24 (×4): qty 1

## 2020-10-24 MED ORDER — LEVOTHYROXINE SODIUM 125 MCG PO TABS
125.0000 ug | ORAL_TABLET | Freq: Every day | ORAL | Status: DC
  Administered 2020-10-25: 06:00:00 125 ug via ORAL
  Filled 2020-10-24: qty 1

## 2020-10-24 MED ORDER — ONDANSETRON HCL 4 MG/2ML IJ SOLN: 4.0000 mg | Freq: Four times a day (QID) | INTRAMUSCULAR | Status: DC | PRN

## 2020-10-24 MED ORDER — POVIDONE-IODINE 10 % EX SWAB: 2.0000 "application " | Freq: Once | CUTANEOUS | Status: DC

## 2020-10-24 MED ORDER — ONDANSETRON HCL 4 MG/2ML IJ SOLN
INTRAMUSCULAR | Status: AC
  Filled 2020-10-24: qty 2

## 2020-10-24 MED ORDER — SUGAMMADEX SODIUM 200 MG/2ML IV SOLN
INTRAVENOUS | Status: DC | PRN
  Administered 2020-10-24: 200 mg via INTRAVENOUS

## 2020-10-24 MED ORDER — ACETAMINOPHEN 10 MG/ML IV SOLN
INTRAVENOUS | Status: AC
  Filled 2020-10-24: qty 100

## 2020-10-24 MED ORDER — MONTELUKAST SODIUM 10 MG PO TABS
10.0000 mg | ORAL_TABLET | Freq: Every day | ORAL | Status: DC | PRN
  Filled 2020-10-24: qty 1

## 2020-10-24 MED ORDER — LABETALOL HCL 5 MG/ML IV SOLN
INTRAVENOUS | Status: AC
  Filled 2020-10-24: qty 4

## 2020-10-24 MED ORDER — KETOROLAC TROMETHAMINE 30 MG/ML IJ SOLN
INTRAMUSCULAR | Status: DC | PRN
  Administered 2020-10-24: 30 mg via INTRAVENOUS

## 2020-10-24 MED ORDER — SIMETHICONE 80 MG PO CHEW: 80.0000 mg | CHEWABLE_TABLET | Freq: Four times a day (QID) | ORAL | Status: DC | PRN

## 2020-10-24 MED ORDER — ALUM & MAG HYDROXIDE-SIMETH 200-200-20 MG/5ML PO SUSP: 30.0000 mL | Freq: Every day | ORAL | Status: DC | PRN

## 2020-10-24 MED ORDER — HYDROCODONE-ACETAMINOPHEN 5-325 MG PO TABS
1.0000 | ORAL_TABLET | ORAL | Status: DC | PRN
Start: 2020-10-24 — End: 2020-10-25
  Administered 2020-10-24 – 2020-10-25 (×4): 2 via ORAL
  Administered 2020-10-25: 1 via ORAL
  Filled 2020-10-24 (×5): qty 2

## 2020-10-24 MED ORDER — DEXAMETHASONE SODIUM PHOSPHATE 10 MG/ML IJ SOLN
INTRAMUSCULAR | Status: AC
  Filled 2020-10-24: qty 1

## 2020-10-24 MED ORDER — DEXMEDETOMIDINE HCL 200 MCG/2ML IV SOLN
INTRAVENOUS | Status: DC | PRN
  Administered 2020-10-24: 4 ug via INTRAVENOUS
  Administered 2020-10-24: 12 ug via INTRAVENOUS
  Administered 2020-10-24: 4 ug via INTRAVENOUS

## 2020-10-24 MED ORDER — FENTANYL CITRATE (PF) 100 MCG/2ML IJ SOLN
INTRAMUSCULAR | Status: AC
  Filled 2020-10-24: qty 2

## 2020-10-24 MED ORDER — FLUTICASONE PROPIONATE 50 MCG/ACT NA SUSP
1.0000 | Freq: Every day | NASAL | Status: DC | PRN
  Filled 2020-10-24: qty 16

## 2020-10-24 MED ORDER — CHLORHEXIDINE GLUCONATE 0.12 % MT SOLN: 15.0000 mL | Freq: Once | OROMUCOSAL | Status: AC

## 2020-10-24 MED ORDER — ALPRAZOLAM 0.5 MG PO TABS
0.5000 mg | ORAL_TABLET | Freq: Three times a day (TID) | ORAL | Status: DC | PRN
  Administered 2020-10-25: 03:00:00 0.5 mg via ORAL
  Filled 2020-10-24: qty 1

## 2020-10-24 MED ORDER — ORAL CARE MOUTH RINSE: 15.0000 mL | Freq: Once | OROMUCOSAL | Status: AC

## 2020-10-24 MED ORDER — ACETAMINOPHEN 10 MG/ML IV SOLN
INTRAVENOUS | Status: DC | PRN
  Administered 2020-10-24: 1000 mg via INTRAVENOUS

## 2020-10-24 MED ORDER — GABAPENTIN 300 MG PO CAPS: 300.0000 mg | ORAL_CAPSULE | ORAL | Status: AC

## 2020-10-24 MED ORDER — CYCLOSPORINE 0.05 % OP EMUL
1.0000 [drp] | Freq: Two times a day (BID) | OPHTHALMIC | Status: DC
  Administered 2020-10-24 – 2020-10-25 (×2): 1 [drp] via OPHTHALMIC
  Filled 2020-10-24 (×5): qty 1

## 2020-10-24 MED ORDER — LABETALOL HCL 5 MG/ML IV SOLN
20.0000 mg | Freq: Once | INTRAVENOUS | Status: AC
  Administered 2020-10-24: 18:00:00 20 mg via INTRAVENOUS
  Filled 2020-10-24: qty 4

## 2020-10-24 MED ORDER — HYDROCODONE-ACETAMINOPHEN 5-325 MG PO TABS: 1.0000 | ORAL_TABLET | ORAL | Status: DC | PRN

## 2020-10-24 MED ORDER — CEFAZOLIN SODIUM-DEXTROSE 2-4 GM/100ML-% IV SOLN
2.0000 g | INTRAVENOUS | Status: AC
  Administered 2020-10-24: 08:00:00 2 g via INTRAVENOUS

## 2020-10-24 MED ORDER — GABAPENTIN 300 MG PO CAPS
ORAL_CAPSULE | ORAL | Status: AC
  Administered 2020-10-24: 07:00:00 300 mg via ORAL
  Filled 2020-10-24: qty 1

## 2020-10-24 MED ORDER — ONDANSETRON HCL 4 MG/2ML IJ SOLN: 4.0000 mg | Freq: Once | INTRAMUSCULAR | Status: DC | PRN

## 2020-10-24 SURGICAL SUPPLY — 79 items
APPLICATOR ARISTA FLEXITIP XL (MISCELLANEOUS) IMPLANT
BAG URINE DRAIN 2000ML AR STRL (UROLOGICAL SUPPLIES) ×8 IMPLANT
BLADE SURG 11 STRL SS (BLADE) ×4 IMPLANT
BLADE SURG SZ11 CARB STEEL (BLADE) ×4 IMPLANT
CANISTER SUCT 1200ML W/VALVE (MISCELLANEOUS) ×4 IMPLANT
CATH FOLEY 2WAY  5CC 16FR (CATHETERS) ×2
CATH ROBINSON RED A/P 16FR (CATHETERS) ×4 IMPLANT
CATH URTH 16FR FL 2W BLN LF (CATHETERS) ×2 IMPLANT
CHLORAPREP W/TINT 26 (MISCELLANEOUS) ×4 IMPLANT
CORD MONOPOLAR M/FML 12FT (MISCELLANEOUS) ×4 IMPLANT
COUNTER NEEDLE 20/40 LG (NEEDLE) ×4 IMPLANT
COVER LIGHT HANDLE STERIS (MISCELLANEOUS) ×8 IMPLANT
COVER WAND RF STERILE (DRAPES) ×4 IMPLANT
DERMABOND ADVANCED (GAUZE/BANDAGES/DRESSINGS) ×2
DERMABOND ADVANCED .7 DNX12 (GAUZE/BANDAGES/DRESSINGS) ×2 IMPLANT
DEVICE SUTURE ENDOST 10MM (ENDOMECHANICALS) IMPLANT
DRAPE GENERAL ENDO 106X123.5 (DRAPES) ×4 IMPLANT
DRAPE PERI LITHO V/GYN (MISCELLANEOUS) ×4 IMPLANT
DRAPE UNDER BUTTOCK W/FLU (DRAPES) ×4 IMPLANT
DRSG TEGADERM 2-3/8X2-3/4 SM (GAUZE/BANDAGES/DRESSINGS) ×12 IMPLANT
ELECT REM PT RETURN 9FT ADLT (ELECTROSURGICAL) ×4
ELECTRODE REM PT RTRN 9FT ADLT (ELECTROSURGICAL) ×2 IMPLANT
ETHIBOND 2 0 GREEN CT 2 30IN (SUTURE) IMPLANT
GAUZE 4X4 16PLY RFD (DISPOSABLE) ×4 IMPLANT
GAUZE PACK 2X3YD (PACKING) ×4 IMPLANT
GLOVE BIO SURGEON STRL SZ7 (GLOVE) ×12 IMPLANT
GLOVE INDICATOR 7.5 STRL GRN (GLOVE) ×8 IMPLANT
GOWN STRL REUS W/ TWL LRG LVL3 (GOWN DISPOSABLE) ×6 IMPLANT
GOWN STRL REUS W/ TWL XL LVL3 (GOWN DISPOSABLE) ×2 IMPLANT
GOWN STRL REUS W/TWL LRG LVL3 (GOWN DISPOSABLE) ×6
GOWN STRL REUS W/TWL XL LVL3 (GOWN DISPOSABLE) ×2
GRASPER SUT TROCAR 14GX15 (MISCELLANEOUS) ×4 IMPLANT
HEMOSTAT ARISTA ABSORB 3G PWDR (HEMOSTASIS) IMPLANT
IRRIGATION STRYKERFLOW (MISCELLANEOUS) ×2 IMPLANT
IRRIGATOR STRYKERFLOW (MISCELLANEOUS) ×4
KIT PINK PAD W/HEAD ARE REST (MISCELLANEOUS) ×4
KIT PINK PAD W/HEAD ARM REST (MISCELLANEOUS) ×2 IMPLANT
KIT TURNOVER CYSTO (KITS) ×4 IMPLANT
L-HOOK LAP DISP 36CM (ELECTROSURGICAL)
LABEL OR SOLS (LABEL) ×4 IMPLANT
LHOOK LAP DISP 36CM (ELECTROSURGICAL) IMPLANT
LIGASURE VESSEL 5MM BLUNT TIP (ELECTROSURGICAL) ×4 IMPLANT
MANIFOLD NEPTUNE II (INSTRUMENTS) ×4 IMPLANT
MANIPULATOR VCARE LG CRV RETR (MISCELLANEOUS) IMPLANT
MANIPULATOR VCARE SML CRV RETR (MISCELLANEOUS) IMPLANT
MANIPULATOR VCARE STD CRV RETR (MISCELLANEOUS) ×4 IMPLANT
NEEDLE HYPO 22GX1.5 SAFETY (NEEDLE) ×4 IMPLANT
NS IRRIG 500ML POUR BTL (IV SOLUTION) ×4 IMPLANT
OCCLUDER COLPOPNEUMO (BALLOONS) ×4 IMPLANT
PACK BASIN MINOR ARMC (MISCELLANEOUS) ×4 IMPLANT
PACK GYN LAPAROSCOPIC (MISCELLANEOUS) ×4 IMPLANT
PAD OB MATERNITY 4.3X12.25 (PERSONAL CARE ITEMS) ×4 IMPLANT
PAD PREP 24X41 OB/GYN DISP (PERSONAL CARE ITEMS) ×4 IMPLANT
PENCIL ELECTRO HAND CTR (MISCELLANEOUS) IMPLANT
SCISSORS METZENBAUM CVD 33 (INSTRUMENTS) IMPLANT
SET CYSTO W/LG BORE CLAMP LF (SET/KITS/TRAYS/PACK) IMPLANT
SLEEVE ENDOPATH XCEL 5M (ENDOMECHANICALS) ×8 IMPLANT
SPONGE GAUZE 2X2 8PLY STER LF (GAUZE/BANDAGES/DRESSINGS) ×2
SPONGE GAUZE 2X2 8PLY STRL LF (GAUZE/BANDAGES/DRESSINGS) ×6 IMPLANT
SUT ENDO VLOC 180-0-8IN (SUTURE) IMPLANT
SUT MNCRL 4-0 (SUTURE)
SUT MNCRL 4-0 27XMFL (SUTURE)
SUT PDS AB 2-0 CT1 27 (SUTURE) ×4 IMPLANT
SUT VIC AB 0 CT1 18XCR BRD 8 (SUTURE) ×2 IMPLANT
SUT VIC AB 0 CT1 36 (SUTURE) ×12 IMPLANT
SUT VIC AB 0 CT1 8-18 (SUTURE) ×2
SUT VIC AB 2-0 CT1 36 (SUTURE) IMPLANT
SUT VIC AB 2-0 SH 27 (SUTURE) ×6
SUT VIC AB 2-0 SH 27XBRD (SUTURE) ×6 IMPLANT
SUT VIC AB 3-0 SH 27 (SUTURE)
SUT VIC AB 3-0 SH 27X BRD (SUTURE) IMPLANT
SUTURE MNCRL 4-0 27XMF (SUTURE) IMPLANT
SYR 10ML LL (SYRINGE) ×4 IMPLANT
SYR 50ML LL SCALE MARK (SYRINGE) ×4 IMPLANT
SYR CONTROL 10ML LL (SYRINGE) ×4 IMPLANT
TOWEL OR 17X26 4PK STRL BLUE (TOWEL DISPOSABLE) ×8 IMPLANT
TROCAR ENDO BLADELESS 11MM (ENDOMECHANICALS) ×4 IMPLANT
TROCAR XCEL NON-BLD 5MMX100MML (ENDOMECHANICALS) ×4 IMPLANT
TUBING EVAC SMOKE HEATED PNEUM (TUBING) ×4 IMPLANT

## 2020-10-24 NOTE — Anesthesia Preprocedure Evaluation (Signed)
Anesthesia Evaluation  Patient identified by MRN, date of birth, ID band Patient awake    Reviewed: Allergy & Precautions, NPO status , Patient's Chart, lab work & pertinent test results  History of Anesthesia Complications Negative for: history of anesthetic complications  Airway Mallampati: I  TM Distance: >3 FB Neck ROM: Limited    Dental  (+) Teeth Intact, Implants   Pulmonary neg pulmonary ROS, neg sleep apnea, neg COPD, Patient abstained from smoking.Not current smoker,    Pulmonary exam normal breath sounds clear to auscultation       Cardiovascular Exercise Tolerance: Good METS(-) hypertension(-) CAD and (-) Past MI negative cardio ROS  (-) dysrhythmias  Rhythm:Regular Rate:Normal - Systolic murmurs    Neuro/Psych negative neurological ROS  negative psych ROS   GI/Hepatic hiatal hernia, neg GERD  ,(+)     (-) substance abuse  ,   Endo/Other  neg diabetesHypothyroidism Morbid obesity  Renal/GU negative Renal ROS     Musculoskeletal  (+) Arthritis , Osteoarthritis and Rheumatoid disorders,    Abdominal (+) + obese,   Peds  Hematology SLE, off her biologics for 6 months due to moving and lapse in medical care. Not on glucocorticoids   Anesthesia Other Findings Past Medical History: No date: Arthritis No date: History of hiatal hernia No date: Hypothyroidism No date: Lupus (HCC) No date: Rheumatoid arthritis (HCC)  Reproductive/Obstetrics                             Anesthesia Physical Anesthesia Plan  ASA: III  Anesthesia Plan: General   Post-op Pain Management:    Induction: Intravenous  PONV Risk Score and Plan: 4 or greater and Ondansetron, Dexamethasone, TIVA, Propofol infusion and Treatment may vary due to age or medical condition  Airway Management Planned: Oral ETT and Video Laryngoscope Planned  Additional Equipment: None  Intra-op Plan:   Post-operative  Plan: Extubation in OR  Informed Consent: I have reviewed the patients History and Physical, chart, labs and discussed the procedure including the risks, benefits and alternatives for the proposed anesthesia with the patient or authorized representative who has indicated his/her understanding and acceptance.     Dental advisory given  Plan Discussed with: CRNA and Surgeon  Anesthesia Plan Comments: (Discussed risks of anesthesia with patient, including PONV, sore throat, lip/dental damage. Rare risks discussed as well, such as cardiorespiratory and neurological sequelae. Patient understands.)        Anesthesia Quick Evaluation

## 2020-10-24 NOTE — Anesthesia Postprocedure Evaluation (Signed)
Anesthesia Post Note  Patient: Candace Juarez  Procedure(s) Performed: TOTAL LAPAROSCOPIC HYSTERECTOMY WITH BILATERAL SALPINGO OOPHORECTOMY (Bilateral ) ANTERIOR REPAIR (CYSTOCELE) (N/A )  Patient location during evaluation: PACU Anesthesia Type: General Level of consciousness: awake and alert Pain management: pain level controlled Vital Signs Assessment: post-procedure vital signs reviewed and stable Respiratory status: spontaneous breathing, nonlabored ventilation, respiratory function stable and patient connected to nasal cannula oxygen Cardiovascular status: blood pressure returned to baseline and stable Postop Assessment: no apparent nausea or vomiting Anesthetic complications: no   No complications documented.   Last Vitals:  Vitals:   10/24/20 1135 10/24/20 1144  BP: (!) 167/91 (!) 159/84  Pulse: 93 85  Resp: 15 15  Temp:    SpO2: 94% 94%    Last Pain:  Vitals:   10/24/20 1135  TempSrc:   PainSc: 2                  Corinda Gubler

## 2020-10-24 NOTE — Progress Notes (Signed)
Day of Surgery Procedure(s): TOTAL LAPAROSCOPIC HYSTERECTOMY WITH BILATERAL SALPINGO OOPHORECTOMY (Bilateral) ANTERIOR REPAIR (CYSTOCELE) (N/A) Subjective: Pain is adequately controlled. No SOB or CP. Resting quietly.   Objective: Vital signs in last 24 hours: Temp:  [96.9 F (36.1 C)-98.7 F (37.1 C)] 98.6 F (37 C) (12/16 1907) Pulse Rate:  [85-118] 96 (12/16 1824) Resp:  [12-18] 18 (12/16 1907) BP: (155-182)/(80-103) 155/84 (12/16 1907) SpO2:  [94 %-100 %] 99 % (12/16 1907) Weight:  [101.2 kg] 101.2 kg (12/16 0627)  Intake/Output  Intake/Output Summary (Last 24 hours) at 10/24/2020 2113 Last data filed at 10/24/2020 2100 Gross per 24 hour  Intake 2469.07 ml  Output 3325 ml  Net -855.93 ml    Physical Exam:  General: Alert and oriented. CV: RRR Lungs: Clear bilaterally. GI: Soft, Nondistended. Incisions: Clean and dry. Urine: Clear Extremities: Nontender, no erythema, no edema.  Assessment/Plan: POD# 0 s/p Procedure(s): TOTAL LAPAROSCOPIC HYSTERECTOMY WITH BILATERAL SALPINGO OOPHORECTOMY (Bilateral) ANTERIOR REPAIR (CYSTOCELE) (N/A).  - Encourage Incentive spirometry -Advance diet as tolerated, but may do clears overnight -Continue oral pain medication, with IV rescue as needed -OOB to chair for 4 hours this afternoon   Christeen Douglas, MD   LOS: 0 days   Christeen Douglas 10/24/2020, 9:13 PM

## 2020-10-24 NOTE — Op Note (Signed)
Candace Juarez PROCEDURE DATE: 10/24/2020  PREOPERATIVE DIAGNOSIS:  POSTOPERATIVE DIAGNOSIS: The same  PROCEDURE:  Total laparoscopic hysterectomy,  bilateral salpingo-oophorectomy  Cystoscopy Anterior colporrhaphy  SURGEON:  Dr. Christeen Douglas, MD ASSISTANT: Dr. Ranae Plumber, MD  Anesthesiologist:  Anesthesiologist: Corinda Gubler, MD CRNA: Lynden Oxford, CRNA; Henrietta Hoover, CRNA  INDICATIONS: 69 y.o.F here for definitive surgical management secondary to the indications listed under preoperative diagnoses; please see preoperative note for further details.  Risks of surgery were discussed with the patient including but not limited to: bleeding which may require transfusion or reoperation; infection which may require antibiotics; injury to bowel, bladder, ureters or other surrounding organs; need for additional procedures; thromboembolic phenomenon, incisional problems and other postoperative/anesthesia complications. Written informed consent was obtained.    FINDINGS:  Small uterus, subserosal fibroid, no evidence of pelvic mass. Ovarian cyst on left, normal right ovary. Bilateral ureters.   Omental adhesions just superior to umbilicus. Normal liver.   ANESTHESIA:    General INTRAVENOUS FLUIDS:1400  ml ESTIMATED BLOOD LOSS:100 ml URINE OUTPUT: 600 ml SPECIMENS: Uterus, cervix, bilateral fallopian tubes and bilateral ovaries COMPLICATIONS: None immediate  PROCEDURE IN DETAIL:  The patient received prophalactic intravenous antibiotics and had sequential compression devices applied to her lower extremities while in the preoperative area.  She was then taken to the operating room where general anesthesia was administered and was found to be adequate.  She was placed in the dorsal lithotomy position, and was prepped and draped in a sterile manner.  A formal time out was performed with all team members present and in agreement.  A V-care uterine manipulator was placed at this time.  A  Foley catheter was inserted into her bladder and attached to constant drainage. Attention was turned to the abdomen where an umbilical incision was made with the scalpel.  The Optiview 5-mm trocar and sleeve were then advanced without difficulty with the laparoscope under direct visualization into the abdomen.  The abdomen was then insufflated with carbon dioxide gas and adequate pneumoperitoneum was obtained.  A survey of the patient's pelvis and abdomen revealed the findings above.  Bilateral lower quadrant ports (5 mm on the right and 5 mm on the left) were then placed under direct visualization.  The pelvis was then carefully examined.  Attention was turned to the IP ligaments. These were fulgurated and ligated, freeing the ovaries from the pelvic sidewall. The broad ligament was transected and the anterior and posterior leaflets of the broad opened. The uterine artery was then skeletonized and a bladder flap was created.  The ureters were noted to be safely away from the area of dissection.  The bladder was then bluntly dissected off the lower uterine segment.    At this point, attention was turned to the uterine vessels, which were clamped and ligated using the Ligasure.  Good hemostasis was noted overall.  The uterosacral and cardinal ligaments were clamped, cut and ligated bilaterally .  Attention was then turned to the cervicovaginal junction, and the bipolar scissors were used to transect the cervix from the surrounding vagina using the ring of the V-care as a guide. This was done circumferentially allowing total hysterectomy.  The uterus was then removed from the vagina and the vaginal cuff incision was then closed with running V-loc.  Overall excellent hemostasis was noted.    Attention was returned to the abdomen.The ureters were reexamined bilaterally and were pulsating normally. The abdominal pressure was reduced and hemostasis was confirmed.  Cystoscopy showed bilateral ureteral jets.  No  stitches were visualized in the bladder during cystoscopy. All trocars were removed under direct visualization, and the abdomen was desufflated.  The fascial incision of the umbilicus was closed with a 0 Vicryl figure of eight stitch.  All skin incisions were closed with 4-0 Vicryl subcuticular stitches and Dermabond.   Anterior repair (Cystocele repair - small)  Attention was then turned to the anterior vaginal mucosa that was grasped in the midline approx 1cm proximate to the ureteral opening using an Allis clamp. Two Allis clamps were used to identify the terminus of the incision just distal to the vaginal cuff. A cross incision was made at the apex. A midline mucosal incision was developed from the bladder neck to the vaginal apex, though this distance was small. The edges of this incision were grasped and the vaginal mucosa was dissected off the underlying pubocervical fascia bilaterally using blunt and sharp dissection. The dissection was taken to the paravaginal spaces bilaterally. This was a very tiny anterior repair, as her prolapse was well supported laterally in the paravaginal spaces.The pubocervical fascia was then plicated in the midline using 2-0 Vicryl sutures from the bladder neck to the bladder base resulting in excellent surgical correction of the cystocele, using only one stitch. The incision was then closed with 0 Vicryl in an interrupted suture.  Overall excellent hemostasis was noted.   The patient tolerated the procedures well.  All instruments, needles, and sponge counts were correct x 2. The patient was taken to the recovery room awake, extubated and in stable condition.

## 2020-10-24 NOTE — Discharge Instructions (Signed)
Discharge instructions after   total laparoscopic hysterectomy and anterior repair   For the next three days, take acetaminophen on a schedule, every 8 hours. You can take them together or you can intersperse them, and take one every four hours. I also gave you gabapentin for nighttime, to help you sleep and also to control pain. Take gabapentin medicines at night for at least the next 3 nights. You may also take Norco every 4 hours as needed for pain.  Postop constipation is a major cause of pain. Stay well hydrated, walk as you tolerate, and take over the counter senna as well as stool softeners if you need them.  Signs and Symptoms to Report Call our office at [phone] if you have any of the following.  . Fever over 100.4 degrees or higher . Severe stomach pain not relieved with pain medications . Bright red bleeding that's heavier than a period that does not slow with rest . To go the bathroom a lot (frequency), you can't hold your urine (urgency), or it hurts when you empty your bladder (urinate) . Chest pain . Shortness of breath . Pain in the calves of your legs . Severe nausea and vomiting not relieved with anti-nausea medications . Signs of infection around your wounds, such as redness, hot to touch, swelling, green/yellow drainage (like pus), bad smelling discharge . Any concerns  What You Can Expect after Surgery . You may see some pink tinged, bloody fluid and bruising around the wound. This is normal. . You may notice shoulder and neck pain. This is caused by the gas used during surgery to expand your abdomen so your surgeon could get to the uterus easier. . You may have a sore throat because of the tube in your mouth during general anesthesia. This will go away in 2 to 3 days. . You may have some stomach cramps. . You may notice spotting on your panties. . You may have pain around the incision sites.  Activities after Your Discharge Follow these guidelines to help  speed your recovery at home: . Do the coughing and deep breathing as you did in the hospital for 2 weeks. Use the small blue breathing device, called the incentive spirometer for 2 weeks. . Don't drive if you are in pain or taking narcotic pain medicine. You may drive when you can safely slam on the brakes, turn the wheel forcefully, and rotate your torso comfortably. This is typically 1-2 weeks. Practice in a parking lot or side street prior to attempting to drive regularly.  . Ask others to help with household chores for 4 weeks. . Do not lift anything heavier that 10 pounds for 4-6 weeks. This includes pets, children, and groceries. . Don't do strenuous activities, exercises, or sports like vacuuming, tennis, squash, etc. until your doctor says it is safe to do so. ---Maintain pelvic rest for 8 weeks. This means nothing in the vagina or rectum at all (no douching, tampons, intercourse) for 8 weeks.  . Walk as you feel able. Rest often since it may take two or three weeks for your energy level to return to normal.  . You may climb stairs . Avoid constipation:   -Eat fruits, vegetables, and whole grains. Eat small meals as your appetite will take time to return to normal.   -Drink 6 to 8 glasses of water each day unless your doctor has told you to limit your fluids.   -Use a laxative or stool softener as needed if  constipation becomes a problem. You may take Miralax, metamucil, Citrucil, Colace, Senekot, FiberCon, etc. If this does not relieve the constipation, try two tablespoons of Milk Of Magnesia every 8 hours until your bowels move.  . You may shower. Gently wash the wounds with a mild soap and water. Pat dry. . Do not get in a hot tub, swimming pool, etc. for 6 weeks. . Do not use lotions, oils, powders on the wounds. . Do not douche, use tampons, or have sex until your doctor says it is okay. . Take your pain medicine when you need it. The medicine may not work as well if the pain is  bad.  Take the medicines you were taking before surgery. Other medications you will need are pain medications and possibly constipation and nausea medications (Zofran).    Here is a helpful article from the website http://mitchell.org/, regarding constipation  Here are reasons why constipation occurs after surgery: 1) During the operation and in the recovery room, most people are given opioid pain medication, primarily through an IV, to treat moderate or severe pain. Intravenous opioids include morphine, Dilaudid and fentanyl. After surgery, patients are often prescribed opioid pain medication to take by mouth at home, including codeine, Vicodin, Norco, and Percocet. All of these medications cause constipation by slowing down the movement of your intestine. 2) Changes in your diet before surgery can be another culprit. It is common to get specific instructions to change how you normally eat or drink before your surgery, like only having liquids the day before or not having anything to eat or drink after midnight the night before surgery. For this reason, temporary dehydration may occur. This, along with not eating or only having liquids, means that you are getting less fiber than usual. Both these factors contribute to constipation. 3) Changes in your diet after surgery can also contribute to the problem. Although many people don't have dietary restrictions after operations, being under anesthesia can make you lose your appetite for several hours and maybe even days. Some people can even have nausea or vomiting. Not eating or drinking normally means that you are not getting enough fiber and you can get dehydrated, both leading to constipation. 4) Lying in a bed more than usual--which happens before, during and after surgery--combined with the medications and diet changes, all work together to slow down your colon and make your poop turn to rock.  No one likes to be constipated.  Let's face it, it's not a  pleasant feeling when you don't poop for days, then strain on the toilet to finally pass something large enough to cause damage. An ounce of prevention is worth a pound of cure, so: 1. Assume you will be constipated. 2. Plan and prepare accordingly. Post-surgery is one of those unique situations where the temporary use of laxatives can make a world of difference. Always consult with your doctor, and recognize that if you wait several days after surgery to take a laxative, the constipation might be too severe for these over-the-counter options. It is always important to discuss all medications you plan on taking with your doctor. Ask your doctor if you can start the laxative immediately after surgery. *  Here are go-to post-surgery laxatives: Senna: Senna is an herb that acts as a "stimulant laxative," meaning it increases the activity of the intestine to cause you to have a bowel movement. It comes in many forms, but senna pills are easy to take and are sold over the counter at  almost all pharmacies. Since opioid pain medications slow down the activity of the intestine, it makes sense to take a medication to help reverse that side effect. Long-term use of a stimulant laxative is not a good idea since it can make your colon "lazy" and not function properly; however, temporary use immediately after surgery is acceptable. In general, if you are able to eat a normal diet, taking senna soon after surgery works the best. Senna usually works within hours to produce a bowel movement, but this is less predictable when you are taking different medications after surgery. Try not to wait several days to start taking senna, as often it is too late by then. Just like with all medications or supplements, check with your doctor before starting new treatment.   Magnesium: Magnesium is an important mineral that our body needs. We get magnesium from some foods that we eat, especially foods that are high in fiber such as  broccoli, almonds and whole grains. There are also magnesium-based medications used to treat constipation including milk of magnesia (magnesium hydroxide), magnesium citrate and magnesium oxide. They work by drawing water into the intestine, putting it into the class of "osmotic" laxatives. Magnesium products in low doses appear to be safe, but if taken in very large doses, can lead to problems such as irregular heartbeat, low blood pressure and even death. It can also affect other medications you might be taking, therefore it is important to discuss using magnesium with your physician and pharmacist before initiating therapy. Most over-the-counter magnesium laxatives work very well to help with the constipation related to surgery, but sometimes they work too well and lead to diarrhea. Make sure you are somewhere with easy access to a bathroom, just in case.   Bisacodyl: Bisacodyl (generic name) is sold under brand names such as Dulcolax. Much like senna, it is a "stimulant laxative," meaning it makes your intestines move more quickly to push out the stool. This is another good choice to start taking as soon as your doctor says you can take a laxative after surgery. It comes in pill form and as a suppository, which is a good choice for people who cannot or are not allowed to swallow pills. Studies have shown that it works as a laxative, but like most of these medications, you should use this on a short-term basis only.   Enema: Enemas strike fear in many people, but FEAR NOT! It's nowhere near as big a deal as you may think. An enema is just a way to get some liquid into your rectum by placing a specially designed device through your anus. If you have never done one, it might seem like a painful, unpleasant, uncomfortable, complicated and lengthy procedure. But in reality, it's simple, takes just a few seconds and is highly effective. The small ready-made bottles you buy at the pharmacy are much easier than the  hose/large rubber container type. Those recommended positions illustrated in some instructions are generally not necessary to place the enema. It's very similar to the insertion of a tampon, requiring a slight squat. Some extra lubrication on the enema's tip (or on your anus) will make it a breeze. In certain cases, there is no substitute for a good enema. For example, if someone has not pooped for a few days, the beginning of the poop waiting to come out can become rock hard. Passing that hard stool can lead to much pain and problems like anal fissures. Inserting a little liquid to break up  the rock-hard stool will help make its passage much easier. Enemas come with different liquids. Most come with saline, but there are also mineral oil options. You can also use warm water in the reusable enema containers. They all work. But since saline can sometimes be irritating, so try a mineral oil or water enema instead.  Here are commonly recommended constipation medications that do not work well for post-surgery constipation: Docusate: Docusate (generic name) most commonly referred to as Colace (brand name) is not really a laxative, but is classified as a stool softener. Although this medication is commonly prescribed, it is not recommended for several reasons: 1) there is no good medical evidence that it works 2) even if it has an effect, which is very questionable, it is minimal and cannot combat the intestinal slowing caused by the opioid medications. Skip docusate to save money and space in your pillbox for something more effective.  PEG: Miralax (brand name) is basically a chemical called polyethylene glycol (PEG) and it has gained tremendous popularity as a laxative. This product is an "osmotic laxative" meaning it works by pulling water into the stool, making it softer. This is very similar to the action of natural fiber in foods and supplements. Therefore, the effect seen by this medication is not immediate,  causing a bowel movement in a day or more. Is this medication strong enough to battle the constipation related to having an operation? Maybe for some people not prone to constipation. But for most people, other laxatives are better to prevent constipation after surgery.  AMBULATORY SURGERY  DISCHARGE INSTRUCTIONS   1) The drugs that you were given will stay in your system until tomorrow so for the next 24 hours you should not:  A) Drive an automobile B) Make any legal decisions C) Drink any alcoholic beverage   2) You may resume regular meals tomorrow.  Today it is better to start with liquids and gradually work up to solid foods.  You may eat anything you prefer, but it is better to start with liquids, then soup and crackers, and gradually work up to solid foods.   3) Please notify your doctor immediately if you have any unusual bleeding, trouble breathing, redness and pain at the surgery site, drainage, fever, or pain not relieved by medication.    4) Additional Instructions:        Please contact your physician with any problems or Same Day Surgery at [phone], Monday through Friday 6 am to 4 pm, or [place] [facility] number at [phone].

## 2020-10-24 NOTE — Transfer of Care (Signed)
Immediate Anesthesia Transfer of Care Note  Patient: Zayla Agar  Procedure(s) Performed: TOTAL LAPAROSCOPIC HYSTERECTOMY WITH BILATERAL SALPINGO OOPHORECTOMY (Bilateral ) ANTERIOR REPAIR (CYSTOCELE) (N/A )  Patient Location: PACU  Anesthesia Type:General  Level of Consciousness: awake, alert  and oriented  Airway & Oxygen Therapy: Patient Spontanous Breathing and Patient connected to face mask oxygen  Post-op Assessment: Report given to RN and Post -op Vital signs reviewed and stable  Post vital signs: Reviewed and stable  Last Vitals:  Vitals Value Taken Time  BP 182/103 10/24/20 1115  Temp 36.1 C 10/24/20 1114  Pulse 83 10/24/20 1117  Resp 18 10/24/20 1117  SpO2 100 % 10/24/20 1117  Vitals shown include unvalidated device data.  Last Pain:  Vitals:   10/24/20 0627  TempSrc: Tympanic  PainSc: 4          Complications: No complications documented.

## 2020-10-24 NOTE — Interval H&P Note (Signed)
History and Physical Interval Note:  10/24/2020 7:32 AM  Candace Juarez  has presented today for surgery, with the diagnosis of post menopausal bleeding, persistent; complex ovarian cyst; bladder prolapse.  The various methods of treatment have been discussed with the patient and family. After consideration of risks, benefits and other options for treatment, the patient has consented to  Procedure(s): TOTAL LAPAROSCOPIC HYSTERECTOMY WITH BILATERAL SALPINGO OOPHORECTOMY (Bilateral) ANTERIOR REPAIR (CYSTOCELE) (N/A) as a surgical intervention.  The patient's history has been reviewed, patient examined, no change in status, stable for surgery.  I have reviewed the patient's chart and labs.  Questions were answered to the patient's satisfaction.     Christeen Douglas

## 2020-10-24 NOTE — Anesthesia Procedure Notes (Signed)
Procedure Name: Intubation Date/Time: 10/24/2020 7:57 AM Performed by: Henrietta Hoover, CRNA Pre-anesthesia Checklist: Patient identified, Patient being monitored, Timeout performed, Emergency Drugs available and Suction available Patient Re-evaluated:Patient Re-evaluated prior to induction Oxygen Delivery Method: Circle system utilized Preoxygenation: Pre-oxygenation with 100% oxygen Induction Type: IV induction Ventilation: Mask ventilation without difficulty Laryngoscope Size: 3 and McGraph Grade View: Grade I Tube type: Oral Tube size: 6.5 mm Number of attempts: 1 Airway Equipment and Method: Stylet Placement Confirmation: ETT inserted through vocal cords under direct vision,  positive ETCO2 and breath sounds checked- equal and bilateral Secured at: 21 cm Tube secured with: Tape Dental Injury: Teeth and Oropharynx as per pre-operative assessment

## 2020-10-25 DIAGNOSIS — N950 Postmenopausal bleeding: Secondary | ICD-10-CM | POA: Diagnosis not present

## 2020-10-25 LAB — BASIC METABOLIC PANEL
Anion gap: 10 (ref 5–15)
BUN: 16 mg/dL (ref 8–23)
CO2: 26 mmol/L (ref 22–32)
Calcium: 8.3 mg/dL — ABNORMAL LOW (ref 8.9–10.3)
Chloride: 100 mmol/L (ref 98–111)
Creatinine, Ser: 0.87 mg/dL (ref 0.44–1.00)
GFR, Estimated: >60 mL/min (ref >60)
Glucose, Bld: 124 mg/dL — ABNORMAL HIGH (ref 70–99)
Potassium: 4.2 mmol/L (ref 3.5–5.1)
Sodium: 136 mmol/L (ref 135–145)

## 2020-10-25 LAB — CBC
HCT: 34.5 % — ABNORMAL LOW (ref 36.0–46.0)
Hemoglobin: 11.2 g/dL — ABNORMAL LOW (ref 12.0–15.0)
MCH: 30.5 pg (ref 26.0–34.0)
MCHC: 32.5 g/dL (ref 30.0–36.0)
MCV: 94 fL (ref 80.0–100.0)
Platelets: 210 10*3/uL (ref 150–400)
RBC: 3.67 MIL/uL — ABNORMAL LOW (ref 3.87–5.11)
RDW: 13.8 % (ref 11.5–15.5)
WBC: 8.2 10*3/uL (ref 4.0–10.5)
nRBC: 0 % (ref 0.0–0.2)

## 2020-10-25 MED ORDER — GABAPENTIN 300 MG PO CAPS: 300.0000 mg | ORAL_CAPSULE | Freq: Every day | ORAL | 0 refills | Status: DC

## 2020-10-25 MED ORDER — DOCUSATE SODIUM 100 MG PO CAPS: 100.0000 mg | ORAL_CAPSULE | Freq: Two times a day (BID) | ORAL | 0 refills | Status: DC

## 2020-10-25 MED ORDER — HYDROCODONE-ACETAMINOPHEN 5-325 MG PO TABS: 1.0000 | ORAL_TABLET | ORAL | 0 refills | Status: DC | PRN

## 2020-10-25 MED ORDER — ACETAMINOPHEN 500 MG PO TABS: 1000.0000 mg | ORAL_TABLET | Freq: Four times a day (QID) | ORAL | 0 refills | Status: AC

## 2020-10-25 NOTE — Progress Notes (Signed)
Pt's daughter arrived for discharge, volunteer wheeling pt to medical mall.

## 2020-10-25 NOTE — Discharge Summary (Signed)
1 Day Post-Op       Procedure(s): TOTAL LAPAROSCOPIC HYSTERECTOMY WITH BILATERAL SALPINGO OOPHORECTOMY (Bilateral) ANTERIOR REPAIR (CYSTOCELE) (N/A) Subjective: The patient is doing well.  No nausea or vomiting. Pain is adequately controlled.  Objective: Vital signs in last 24 hours: Temp:  [96.9 F (36.1 C)-98.6 F (37 C)] 98.2 F (36.8 C) (12/17 0315) Pulse Rate:  [85-118] 108 (12/17 0315) Resp:  [12-18] 18 (12/17 0315) BP: (143-182)/(69-103) 153/83 (12/17 0315) SpO2:  [94 %-100 %] 99 % (12/17 0315)  Intake/Output  Intake/Output Summary (Last 24 hours) at 10/25/2020 0738 Last data filed at 10/25/2020 0340 Gross per 24 hour  Intake 2469.07 ml  Output 4325 ml  Net -1855.93 ml    Physical Exam:  General: Alert and oriented. CV: RRR Lungs: Clear bilaterally. GI: Soft, Nondistended. Incisions: Clean and dry. Urine: Clear Extremities: Nontender, no erythema, no edema.  Lab Results: Recent Labs    10/25/20 0512  HGB 11.2*  HCT 34.5*  WBC 8.2  PLT 210                 Results for orders placed or performed during the hospital encounter of 10/24/20 (from the past 24 hour(s))  CBC     Status: Abnormal   Collection Time: 10/25/20  5:12 AM  Result Value Ref Range   WBC 8.2 4.0 - 10.5 K/uL   RBC 3.67 (L) 3.87 - 5.11 MIL/uL   Hemoglobin 11.2 (L) 12.0 - 15.0 g/dL   HCT 38.1 (L) 01.7 - 51.0 %   MCV 94.0 80.0 - 100.0 fL   MCH 30.5 26.0 - 34.0 pg   MCHC 32.5 30.0 - 36.0 g/dL   RDW 25.8 52.7 - 78.2 %   Platelets 210 150 - 400 K/uL   nRBC 0.0 0.0 - 0.2 %  Basic metabolic panel     Status: Abnormal   Collection Time: 10/25/20  5:12 AM  Result Value Ref Range   Sodium 136 135 - 145 mmol/L   Potassium 4.2 3.5 - 5.1 mmol/L   Chloride 100 98 - 111 mmol/L   CO2 26 22 - 32 mmol/L   Glucose, Bld 124 (H) 70 - 99 mg/dL   BUN 16 8 - 23 mg/dL   Creatinine, Ser 4.23 0.44 - 1.00 mg/dL   Calcium 8.3 (L) 8.9 - 10.3 mg/dL   GFR, Estimated >53 >61 mL/min   Anion gap 10 5 - 15     Assessment/Plan: 1 Day Post-Op       Procedure(s): TOTAL LAPAROSCOPIC HYSTERECTOMY WITH BILATERAL SALPINGO OOPHORECTOMY (Bilateral) ANTERIOR REPAIR (CYSTOCELE) (N/A)  -Ambulate, Incentive spirometry -Advance diet as tolerated -Discharge home today anticipated    Christeen Douglas, MD   LOS: 0 days   Christeen Douglas 10/25/2020, 7:38 AM

## 2020-10-25 NOTE — Progress Notes (Signed)
Patient will be discharged home with daughter. Discharge instructions and prescriptions given and reviewed with patient. Patient encouraged to monitor her BP at home since it has been elevated since surgery at times. Patient verbalized understanding. Will be escorted out by axillary when ride is here after lunch.

## 2020-10-28 LAB — SURGICAL PATHOLOGY

## 2020-11-05 ENCOUNTER — Encounter: Payer: Self-pay | Admitting: Obstetrics and Gynecology

## 2020-11-11 ENCOUNTER — Other Ambulatory Visit: Payer: Self-pay

## 2020-11-11 ENCOUNTER — Ambulatory Visit (LOCAL_COMMUNITY_HEALTH_CENTER): Payer: Medicare Other

## 2020-11-11 ENCOUNTER — Telehealth: Payer: Self-pay

## 2020-11-11 VITALS — Ht 62.0 in | Wt 226.0 lb

## 2020-11-11 DIAGNOSIS — R7612 Nonspecific reaction to cell mediated immunity measurement of gamma interferon antigen response without active tuberculosis: Secondary | ICD-10-CM | POA: Insufficient documentation

## 2020-11-11 NOTE — Progress Notes (Signed)
EPI completed via phone d/t increase covid #s.   Referred by Dr. Allena Katz- [facility] for +QFT.  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 Allena Katz contact info [phone]    Fax [phone]  Discussed LTBI vs Active TB. 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 Discussed Rifampin shortage which will affect start date. TB RN will call patient once Rifampin in stock. Richmond Campbell, RN

## 2020-11-11 NOTE — Telephone Encounter (Signed)
TC with patient re: +QFT.   EPI completed by phone Richmond Campbell, RN

## 2020-11-21 ENCOUNTER — Ambulatory Visit
Payer: Medicare Other | Attending: Student in an Organized Health Care Education/Training Program | Admitting: Student in an Organized Health Care Education/Training Program

## 2020-11-21 ENCOUNTER — Encounter: Payer: Self-pay | Admitting: Student in an Organized Health Care Education/Training Program

## 2020-11-21 ENCOUNTER — Other Ambulatory Visit: Payer: Self-pay

## 2020-11-21 VITALS — BP 157/70 | HR 87 | Temp 97.2°F | Resp 16 | Ht 61.0 in | Wt 226.0 lb

## 2020-11-21 DIAGNOSIS — G894 Chronic pain syndrome: Secondary | ICD-10-CM | POA: Diagnosis not present

## 2020-11-21 DIAGNOSIS — M25552 Pain in left hip: Secondary | ICD-10-CM | POA: Diagnosis present

## 2020-11-21 DIAGNOSIS — M25562 Pain in left knee: Secondary | ICD-10-CM | POA: Insufficient documentation

## 2020-11-21 DIAGNOSIS — Z9889 Other specified postprocedural states: Secondary | ICD-10-CM | POA: Insufficient documentation

## 2020-11-21 DIAGNOSIS — M25561 Pain in right knee: Secondary | ICD-10-CM | POA: Diagnosis present

## 2020-11-21 DIAGNOSIS — M25551 Pain in right hip: Secondary | ICD-10-CM | POA: Diagnosis present

## 2020-11-21 DIAGNOSIS — M069 Rheumatoid arthritis, unspecified: Secondary | ICD-10-CM | POA: Insufficient documentation

## 2020-11-21 DIAGNOSIS — G8929 Other chronic pain: Secondary | ICD-10-CM | POA: Insufficient documentation

## 2020-11-21 DIAGNOSIS — Z96642 Presence of left artificial hip joint: Secondary | ICD-10-CM | POA: Diagnosis present

## 2020-11-21 DIAGNOSIS — M2550 Pain in unspecified joint: Secondary | ICD-10-CM | POA: Diagnosis present

## 2020-11-21 DIAGNOSIS — Z96641 Presence of right artificial hip joint: Secondary | ICD-10-CM | POA: Insufficient documentation

## 2020-11-21 NOTE — Progress Notes (Signed)
Safety precautions to be maintained throughout the outpatient stay will include: orient to surroundings, keep bed in low position, maintain call bell within reach at all times, provide assistance with transfer out of bed and ambulation.  

## 2020-11-21 NOTE — Progress Notes (Signed)
Patient: Candace Juarez  Service Category: E/M  Provider: Gillis Santa, MD  DOB: 1951/05/18  DOS: 11/21/2020  Referring Provider: Quintin Alto, MD  MRN: [id number]  Setting: Ambulatory outpatient  PCP: Gladstone Lighter, MD  Type: New Patient  Specialty: Interventional Pain Management    Location: Office  Delivery: Face-to-face     Primary Reason(s) for Visit: Encounter for initial evaluation of one or more chronic problems (new to examiner) potentially causing chronic pain, and posing a threat to normal musculoskeletal function. (Level of risk: High) CC: Hip Pain (Left, hip replacement x 2 /Right side pain as well ), Joint Pain (Generalized arthritic pain as well as lupus ), Knee Pain (Left, bakers cyst ), Shoulder Pain (Left side intermittent ), and Hand Pain (Bilateral )  HPI  [name] is a 70 y.o. year old, female patient, who comes for the first time to our practice referred by Quintin Alto, MD for our initial evaluation of her chronic pain. 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 Today she comes in for evaluation of her Hip Pain (Left, hip replacement x 2 /Right side pain as well ), Joint Pain (Generalized arthritic pain as well as lupus ), Knee Pain (Left, bakers cyst ), Shoulder Pain (Left side intermittent ), and Hand Pain (Bilateral )  Pain Assessment: Location: Right (right hip is worse than left which is a joint replacement x 2) Hip (see visit info for additional pain sites.   knees and hip are number 1) Radiating: denies Onset: More than a month ago Duration: Chronic pain Quality: Discomfort Severity: 3  (has taken hydrocodone today)/10 (subjective, self-reported pain score)  Effect on ADL: takes her  a very long time to get her stamina going in the morning upon rising. Timing: Intermittent (hand pain is constant) Modifying factors: pain medications. BP: (!) 157/70   HR: 87  Onset and Duration: Date of onset: 30 years ago Cause of pain: lupus Severity: NAS-11 at its worse: 7/10, NAS-11 at its best: 2/10, NAS-11 now: 3/10 and NAS-11 on the average: 3/10 Timing: Morning, Night and After activity or exercise Aggravating Factors: Climbing, Kneeling, Lifiting, Prolonged sitting, Squatting, Stooping  and Walking uphill Alleviating Factors: Stretching, Hot packs, Lying down, Medications, Resting, Sleeping, TENS, Warm showers or baths and physical therapy Associated Problems: Fatigue, Inability to control bladder (urine), Numbness, Swelling, Tingling and Pain that does not allow patient to sleep Quality of Pain: Aching, Annoying, Constant, Dull, Exhausting, Pressure-like, Pulsating, Sharp and Throbbing Previous Examinations or Tests: CT scan, MRI scan, X-rays and Nerve conduction test Previous Treatments: Epidural steroid injections, Narcotic medications, Physical Therapy, Steroid treatments by mouth and TENS  Candace Juarez is a very pleasant 70 recently from [place] from the [place] region.  She is from [place].  She has a history of rheumatoid arthritis in multiple sites presents today with [place] arthralgias most pronounced in her right hip as well as left knee.  Patient was being seen by pain management in [place].  She was on hydrocodone 7.5 mg 3 times daily as needed there.  She states that this regimen helped manage her pain.  She has a history of left hip replacement x2.  The second 1 was a revision surgery.  She also has a history of right knee surgery, arthroscopic.  She is being considered for a right hip replacement.  More recently, she had a hysterectomy.  Of note she has tried steroid and viscosupplementation for her right knee but not her left knee.  Historic Controlled Substance  Pharmacotherapy Review  Historical Monitoring: The patient  reports no history of drug use. List of all UDS Test(s): No results found for: MDMA, COCAINSCRNUR, [place], [place], CANNABQUANT, THCU, [place] List of other Serum/Urine Drug Screening Test(s):  No results found for: AMPHSCRSER, BARBSCRSER, BENZOSCRSER, COCAINSCRSER, COCAINSCRNUR, PCPSCRSER, PCPQUANT, THCSCRSER, THCU, CANNABQUANT, OPIATESCRSER, OXYSCRSER, PROPOXSCRSER, ETH Historical Background Evaluation: [place] PMP: PDMP reviewed during this encounter. Online review of the past 73-month period conducted.             [place] Department of public safety, offender search: Editor, commissioning Information) Non-contributory Risk Assessment Profile: Aberrant behavior: None observed or detected today Risk factors for fatal opioid overdose: None identified today Fatal overdose hazard ratio (HR): Calculation deferred Non-fatal overdose hazard ratio (HR): Calculation deferred Risk of opioid abuse or dependence: 0.7-3.0% with doses ? 36 MME/day and 6.1-26% with doses ? 120 MME/day. Substance use disorder (SUD) risk level: See below Personal History of Substance Abuse (SUD-Substance use disorder):  Alcohol: Negative  Illegal Drugs: Negative  Rx Drugs: Negative  ORT Risk Level calculation: Low Risk  Opioid Risk Tool - 11/21/20 1309      Family History of Substance Abuse   Alcohol Negative    Illegal Drugs Negative    Rx Drugs Negative      Personal History of Substance Abuse   Alcohol Negative    Illegal Drugs Negative    Rx Drugs Negative      Age   Age between 8-72 years  No      Psychological Disease   Psychological Disease Positive    ADD Negative    OCD Negative    Bipolar Negative    Schizophrenia Negative    Depression Positive   seeing psychologist     Total Score   Opioid Risk Tool Scoring 3    Opioid Risk Interpretation Low Risk          ORT Scoring interpretation table:  Score <3 = Low Risk for SUD  Score between 4-7 =  Moderate Risk for SUD  Score >8 = High Risk for Opioid Abuse   PHQ-2 Depression Scale:  Total score:    PHQ-2 Scoring interpretation table: (Score and probability of major depressive disorder)  Score 0 = No depression  Score 1 = 15.4% Probability  Score 2 = 21.1% Probability  Score 3 = 38.4% Probability  Score 4 = 45.5% Probability  Score 5 = 56.4% Probability  Score 6 = 78.6% Probability   PHQ-9 Depression Scale:  Total score:    PHQ-9 Scoring interpretation table:  Score 0-4 = No depression  Score 5-9 = Mild depression  Score 10-14 = Moderate depression  Score 15-19 = Moderately severe depression  Score 20-27 = Severe depression (2.4 times higher risk of SUD and 2.89 times higher risk of overuse)   Pharmacologic Plan: As per protocol, I have not taken over any controlled substance management, pending the results of ordered tests and/or consults.            Initial impression: Pending review of available data and ordered tests.  Meds   Current Outpatient Medications:    Abatacept (ORENCIA IV), Inject 1,000 mg into the vein every 30 (thirty) days., Disp: , Rfl:    ALPRAZolam (XANAX) 0.5 MG tablet, Take 0.5  mg by mouth 3 (three) times daily as needed for anxiety., Disp: , Rfl:    aluminum-magnesium hydroxide-simethicone (MAALOX) 937-169-67 MG/5ML SUSP, Take 30 mLs by mouth daily as needed (heartburn)., Disp: , Rfl:    cetirizine (ZYRTEC) 10 MG tablet, Take 10 mg by mouth daily as needed for allergies., Disp: , Rfl:    cycloSPORINE (RESTASIS) 0.05 % ophthalmic emulsion, Place 1 drop into both eyes 2 (two) times daily., Disp: , Rfl:    diphenhydrAMINE (SOMINEX) 25 MG tablet, Take 25 mg by mouth at bedtime as needed for sleep., Disp: , Rfl:    diphenoxylate-atropine (LOMOTIL) 2.5-0.025 MG tablet, Take 1 tablet by mouth daily as needed for diarrhea or loose stools., Disp: , Rfl:    docusate sodium (COLACE) 100 MG capsule, Take 1 capsule (100 mg total) by mouth 2 (two) times  daily. To keep stools soft, Disp: 30 capsule, Rfl: 0   ergocalciferol (VITAMIN D2) 1.25 MG (50000 UT) capsule, Take 1,250 mcg by mouth once a week., Disp: , Rfl:    estradiol (ESTRACE) 2 MG tablet, Take 1 mg by mouth daily., Disp: , Rfl:    fluticasone (FLONASE) 50 MCG/ACT nasal spray, Place 1 spray into both nostrils daily as needed for allergies or rhinitis., Disp: , Rfl:    HYDROcodone-acetaminophen (NORCO) 7.5-325 MG tablet, Take 1 tablet by mouth every 6 (six) hours as needed for moderate pain., Disp: , Rfl:    hydroxychloroquine (PLAQUENIL) 200 MG tablet, Take 200 mg by mouth 2 (two) times daily., Disp: , Rfl:    levothyroxine (SYNTHROID) 125 MCG tablet, Take 125 mcg by mouth daily before breakfast., Disp: , Rfl:    NYSTATIN powder, Apply 1 application topically 2 (two) times daily. , Disp: , Rfl:    Polyethyl Glycol-Propyl Glycol (SYSTANE ULTRA OP), Place 1 drop into both eyes daily as needed (dry eyes)., Disp: , Rfl:    solifenacin (VESICARE) 5 MG tablet, Take 5 mg by mouth daily., Disp: , Rfl:    venlafaxine XR (EFFEXOR-XR) 75 MG 24 hr capsule, Take 75 mg by mouth daily., Disp: , Rfl:   ROS  Cardiovascular: No reported cardiovascular signs or symptoms such as High blood pressure, coronary artery disease, abnormal heart rate or rhythm, heart attack, blood thinner therapy or heart weakness and/or failure Pulmonary or Respiratory: No reported pulmonary signs or symptoms such as wheezing and difficulty taking a deep full breath (Asthma), difficulty blowing air out (Emphysema), coughing up mucus (Bronchitis), persistent dry cough, or temporary stoppage of breathing during sleep Neurological: No reported neurological signs or symptoms such as seizures, abnormal skin sensations, urinary and/or fecal incontinence, being born with an abnormal open spine and/or a tethered spinal cord Psychological-Psychiatric: Anxiousness, Depressed and Prone to panicking Gastrointestinal: Vomiting blood  (Ulcers) and Reflux or heatburn Genitourinary: No reported renal or genitourinary signs or symptoms such as difficulty voiding or producing urine, peeing blood, non-functioning kidney, kidney stones, difficulty emptying the bladder, difficulty controlling the flow of urine, or chronic kidney disease Hematological: No reported hematological signs or symptoms such as prolonged bleeding, low or poor functioning platelets, bruising or bleeding easily, hereditary bleeding problems, low energy levels due to low hemoglobin or being anemic Endocrine: Slow thyroid Rheumatologic: Butterfly-like facial rash (Lupus), Joint aches and or swelling due to excess weight (Osteoarthritis) and Rheumatoid arthritis Musculoskeletal: Negative for myasthenia gravis, muscular dystrophy, multiple sclerosis or malignant hyperthermia Work History: Retired  Allergies  Ms. Spink is allergic to sulfa antibiotics and nsaids.  Laboratory Chemistry Profile  Renal Lab Results  Component Value Date   BUN 16 10/25/2020   CREATININE 0.87 10/25/2020   GFRNONAA >60 10/25/2020   PROTEINUR NEGATIVE 09/06/2020     Electrolytes Lab Results  Component Value Date   NA 136 10/25/2020   K 4.2 10/25/2020   CL 100 10/25/2020   CALCIUM 8.3 (L) 10/25/2020     Hepatic Lab Results  Component Value Date   AST 23 09/06/2020   ALT 16 09/06/2020   ALBUMIN 3.8 09/06/2020   ALKPHOS 49 09/06/2020   LIPASE 28 09/06/2020     ID Lab Results  Component Value Date   SARSCOV2NAA NEGATIVE 10/22/2020     Bone No results found for: VD25OH, VD125OH2TOT, VS6799OO5, VF4082UB2, 25OHVITD1, 25OHVITD2, 25OHVITD3, TESTOFREE, TESTOSTERONE   Endocrine Lab Results  Component Value Date   GLUCOSE 124 (H) 10/25/2020   GLUCOSEU NEGATIVE 09/06/2020     Neuropathy No results found for: VITAMINB12, FOLATE, HGBA1C, HIV   CNS No results found for: COLORCSF, APPEARCSF, RBCCOUNTCSF, WBCCSF, POLYSCSF, LYMPHSCSF, EOSCSF, PROTEINCSF, GLUCCSF, JCVIRUS,  CSFOLI, IGGCSF, LABACHR, ACETBL, LABACHR, ACETBL   Inflammation (CRP: Acute   ESR: Chronic) No results found for: CRP, ESRSEDRATE, LATICACIDVEN   Rheumatology No results found for: RF, ANA, LABURIC, URICUR, LYMEIGGIGMAB, LYMEABIGMQN, HLAB27   Coagulation Lab Results  Component Value Date   INR 1.0 09/06/2020   LABPROT 12.9 09/06/2020   PLT 210 10/25/2020     Cardiovascular Lab Results  Component Value Date   HGB 11.2 (L) 10/25/2020   HCT 34.5 (L) 10/25/2020     Screening Lab Results  Component Value Date   SARSCOV2NAA NEGATIVE 10/22/2020     Cancer No results found for: CEA, CA125, LABCA2   Allergens No results found for: ALMOND, APPLE, ASPARAGUS, AVOCADO, BANANA, BARLEY, BASIL, BAYLEAF, GREENBEAN, LIMABEAN, WHITEBEAN, BEEFIGE, REDBEET, BLUEBERRY, BROCCOLI, CABBAGE, MELON, CARROT, CASEIN, CASHEWNUT, CAULIFLOWER, CELERY     Note: Lab results reviewed.  PFSH  Drug: Ms. Freehling  reports no history of drug use. Alcohol:  reports no history of alcohol use. Tobacco:  reports that she has never smoked. She has never used smokeless tobacco. Medical:  has a past medical history of Arthritis, History of hiatal hernia, Hypothyroidism, Lupus (HCC), and Rheumatoid arthritis (HCC). Family: family history is not on file.  Past Surgical History:  Procedure Laterality Date   BREAST ENHANCEMENT SURGERY     CHOLECYSTECTOMY     CHOLESTEATOMA EXCISION Right    x2   COLONOSCOPY     CYSTOCELE REPAIR N/A 10/24/2020   Procedure: ANTERIOR REPAIR (CYSTOCELE);  Surgeon: Christeen Douglas, MD;  Location: [facility];  Service: Gynecology;  Laterality: N/A;   HERNIA REPAIR     JOINT REPLACEMENT Left    x2   TOTAL LAPAROSCOPIC HYSTERECTOMY WITH BILATERAL SALPINGO OOPHORECTOMY Bilateral 10/24/2020   Procedure: TOTAL LAPAROSCOPIC HYSTERECTOMY WITH BILATERAL SALPINGO OOPHORECTOMY;  Surgeon: Christeen Douglas, MD;  Location: [facility];  Service: Gynecology;  Laterality: Bilateral;   TUBAL  LIGATION     vericose     Active Ambulatory Problems    Diagnosis Date Noted   Postmenopausal bleeding 10/24/2020   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 11/11/2020   Rheumatoid arthritis involving multiple sites (HCC) 11/21/2020   Polyarthralgia 11/21/2020   History of left hip replacement 11/21/2020   H/O right knee surgery 11/21/2020   Chronic pain of both knees 11/21/2020   Chronic pain of both hips 11/21/2020   Chronic pain syndrome 11/21/2020   Resolved Ambulatory Problems    Diagnosis  Date Noted   No Resolved Ambulatory Problems   Past Medical History:  Diagnosis Date   Arthritis    History of hiatal hernia    Hypothyroidism    Lupus ([place])    Rheumatoid arthritis ([place])    Constitutional Exam  General appearance: Well nourished, well developed, and well hydrated. In no apparent acute distress Vitals:   11/21/20 1255  BP: (!) 157/70  Pulse: 87  Resp: 16  Temp: (!) 97.2 F (36.2 C)  TempSrc: Temporal  SpO2: 99%  Weight: 226 lb (102.5 kg)  Height: $Remove'5\' 1"'qGESaKj$  (1.549 m)   BMI Assessment: Estimated body mass index is 42.7 kg/m as calculated from the following:   Height as of this encounter: $RemoveBeforeD'5\' 1"'ignxqcSorkCGGL$  (1.549 m).   Weight as of this encounter: 226 lb (102.5 kg).  BMI interpretation table: BMI level Category Range association with higher incidence of chronic pain  <18 kg/m2 Underweight   18.5-24.9 kg/m2 Ideal body weight   25-29.9 kg/m2 Overweight Increased incidence by 20%  30-34.9 kg/m2 Obese (Class I) Increased incidence by 68%  35-39.9 kg/m2 Severe obesity (Class II) Increased incidence by 136%  >40 kg/m2 Extreme obesity (Class III) Increased incidence by 254%   Patient's current BMI Ideal Body weight  Body mass index is 42.7 kg/m. Ideal body weight: 47.8 kg (105 lb 6.1 oz) Adjusted ideal body weight: 69.7 kg (153 lb 10 oz)   BMI Readings from Last 4 Encounters:  11/21/20 42.70 kg/m  11/11/20 41.34 kg/m  10/24/20  40.81 kg/m  10/14/20 40.79 kg/m   Wt Readings from Last 4 Encounters:  11/21/20 226 lb (102.5 kg)  11/11/20 226 lb (102.5 kg)  10/24/20 223 lb 1.7 oz (101.2 kg)  10/14/20 223 lb (101.2 kg)    Psych/Mental status: Alert, oriented x 3 (person, place, & time)       Eyes: PERLA Respiratory: No evidence of acute respiratory distress  Cervical Spine Exam  Skin & Axial Inspection: No masses, redness, edema, swelling, or associated skin lesions Alignment: Symmetrical Functional ROM: Unrestricted ROM      Stability: No instability detected Muscle Tone/Strength: Functionally intact. No obvious neuro-muscular anomalies detected. Sensory (Neurological): Musculoskeletal pain pattern Palpation: No palpable anomalies              Upper Extremity (UE) Exam    Side: Right upper extremity  Side: Left upper extremity  Skin & Extremity Inspection: Skin color, temperature, and hair growth are WNL. No peripheral edema or cyanosis. No masses, redness, swelling, asymmetry, or associated skin lesions. No contractures.  Skin & Extremity Inspection: Skin color, temperature, and hair growth are WNL. No peripheral edema or cyanosis. No masses, redness, swelling, asymmetry, or associated skin lesions. No contractures.  Functional ROM: Unrestricted ROM          Functional ROM: Unrestricted ROM          Muscle Tone/Strength: Functionally intact. No obvious neuro-muscular anomalies detected.   Muscle Tone/Strength: Functionally intact. No obvious neuro-muscular anomalies detected.  Sensory (Neurological): Musculoskeletal pain pattern          Sensory (Neurological): Musculoskeletal pain pattern          Palpation: No palpable anomalies              Palpation: No palpable anomalies               Thoracic Spine Area Exam  Skin & Axial Inspection: No masses, redness, or swelling Alignment: Symmetrical Functional ROM: Unrestricted ROM Stability: No instability detected  Muscle Tone/Strength: Functionally intact. No  obvious neuro-muscular anomalies detected. Sensory (Neurological): Unimpaired  Lumbar Exam  Skin & Axial Inspection: No masses, redness, or swelling Alignment: Symmetrical Functional ROM: Unrestricted ROM       Stability: No instability detected Muscle Tone/Strength: Functionally intact. No obvious neuro-muscular anomalies detected. Sensory (Neurological): Musculoskeletal pain pattern   Gait & Posture Assessment  Ambulation: Unassisted Gait: Relatively normal for age and body habitus Posture: WNL   Lower Extremity Exam    Side: Right lower extremity  Side: Left lower extremity  Stability: No instability observed          Stability: No instability observed          Skin & Extremity Inspection: Skin color, temperature, and hair growth are WNL. No peripheral edema or cyanosis. No masses, redness, swelling, asymmetry, or associated skin lesions. No contractures.  Skin & Extremity Inspection: Evidence of prior arthroplastic surgery  Functional ROM: Pain restricted ROM for hip and knee joints          Functional ROM: Unrestricted ROM                  Muscle Tone/Strength: Functionally intact. No obvious neuro-muscular anomalies detected.  Muscle Tone/Strength: Functionally intact. No obvious neuro-muscular anomalies detected.  Sensory (Neurological): Unimpaired        Sensory (Neurological): Arthropathic arthralgia knee        DTR: Patellar: deferred today Achilles: deferred today Plantar: deferred today  DTR: Patellar: deferred today Achilles: deferred today Plantar: deferred today  Palpation: No palpable anomalies  Palpation: No palpable anomalies   Assessment  Primary Diagnosis & Pertinent Problem List: The primary encounter diagnosis was Rheumatoid arthritis involving multiple sites, unspecified whether rheumatoid factor present ([place]). Diagnoses of Polyarthralgia, History of left hip replacement, H/O right knee surgery, Chronic pain of both knees, Chronic pain of both hips, and  Chronic pain syndrome were also pertinent to this visit.  Visit Diagnosis (New problems to examiner): 1. Rheumatoid arthritis involving multiple sites, unspecified whether rheumatoid factor present ([place])   2. Polyarthralgia   3. History of left hip replacement   4. H/O right knee surgery   5. Chronic pain of both knees   6. Chronic pain of both hips   7. Chronic pain syndrome    Plan of Care (Initial workup plan)  Note: Ms. Haberman was reminded that as per protocol, today's visit has been an evaluation only. We have not taken over the patient's controlled substance management.  1.  UDS today 2.  Left knee pain related to osteoarthritis, discussed Hyalgan series as well as genicular nerve block and possible RFA.  As needed order placed as below 3.  Follow-up in 2 weeks for medication management 4.  Referral to physical therapy   Lab Orders     Compliance Drug Analysis, Ur   Referral Orders     Ambulatory referral to Physical Therapy  Procedure Orders     KNEE INJECTION     GENICULAR NERVE BLOCK  Pharmacological management options:  Opioid Analgesics: The patient was informed that there is no guarantee that she would be a candidate for opioid analgesics. The decision will be made following CDC guidelines. This decision will be based on the results of diagnostic studies, as well as Ms. Menefee's risk profile.   Membrane stabilizer: Has tried gabapentin in the past.  Currently on Effexor.  Avoid Cymbalta.  Can consider Lyrica trial in the future  Muscle relaxant: To be determined at a later time  NSAID: To be determined at a later time  Other analgesic(s): To be determined at a later time   Interventional management options: Ms. Uliano was informed that there is no guarantee that she would be a candidate for interventional therapies. The decision will be based on the results of diagnostic studies, as well as Ms. Gero's risk profile.  Procedure(s) under consideration:  Left knee  Hyalgan Left knee genicular nerve block Right hip intra-articular steroid   Provider-requested follow-up: Return in about 2 weeks (around 12/05/2020) for Medication Management.  Future Appointments  Date Time Provider [place]  12/04/2020 11:00 AM Gillis Santa, MD [facility] None    Note by: Gillis Santa, MD Date: 11/21/2020; Time: 2:18 PM

## 2020-11-28 LAB — COMPLIANCE DRUG ANALYSIS, UR

## 2020-12-04 ENCOUNTER — Encounter: Payer: Self-pay | Admitting: Student in an Organized Health Care Education/Training Program

## 2020-12-04 ENCOUNTER — Other Ambulatory Visit: Payer: Self-pay

## 2020-12-04 ENCOUNTER — Ambulatory Visit
Payer: Medicare Other | Attending: Student in an Organized Health Care Education/Training Program | Admitting: Student in an Organized Health Care Education/Training Program

## 2020-12-04 VITALS — BP 151/75 | HR 90 | Temp 97.2°F | Resp 18 | Ht 62.0 in | Wt 230.0 lb

## 2020-12-04 DIAGNOSIS — G894 Chronic pain syndrome: Secondary | ICD-10-CM | POA: Diagnosis present

## 2020-12-04 DIAGNOSIS — G8929 Other chronic pain: Secondary | ICD-10-CM | POA: Diagnosis present

## 2020-12-04 DIAGNOSIS — Z9889 Other specified postprocedural states: Secondary | ICD-10-CM | POA: Diagnosis present

## 2020-12-04 DIAGNOSIS — M25562 Pain in left knee: Secondary | ICD-10-CM | POA: Insufficient documentation

## 2020-12-04 DIAGNOSIS — M25552 Pain in left hip: Secondary | ICD-10-CM | POA: Diagnosis present

## 2020-12-04 DIAGNOSIS — M25561 Pain in right knee: Secondary | ICD-10-CM | POA: Diagnosis present

## 2020-12-04 DIAGNOSIS — M25551 Pain in right hip: Secondary | ICD-10-CM | POA: Insufficient documentation

## 2020-12-04 DIAGNOSIS — Z96642 Presence of left artificial hip joint: Secondary | ICD-10-CM | POA: Diagnosis present

## 2020-12-04 DIAGNOSIS — Z0289 Encounter for other administrative examinations: Secondary | ICD-10-CM | POA: Insufficient documentation

## 2020-12-04 DIAGNOSIS — M2550 Pain in unspecified joint: Secondary | ICD-10-CM | POA: Insufficient documentation

## 2020-12-04 DIAGNOSIS — M069 Rheumatoid arthritis, unspecified: Secondary | ICD-10-CM | POA: Diagnosis present

## 2020-12-04 DIAGNOSIS — R7612 Nonspecific reaction to cell mediated immunity measurement of gamma interferon antigen response without active tuberculosis: Secondary | ICD-10-CM

## 2020-12-04 MED ORDER — HYDROCODONE-ACETAMINOPHEN 7.5-325 MG PO TABS: 1.0000 | ORAL_TABLET | Freq: Three times a day (TID) | ORAL | 0 refills | Status: DC | PRN

## 2020-12-04 MED ORDER — HYDROCODONE-ACETAMINOPHEN 7.5-325 MG PO TABS: 1.0000 | ORAL_TABLET | Freq: Three times a day (TID) | ORAL | 0 refills | Status: AC | PRN

## 2020-12-04 NOTE — Progress Notes (Signed)
Safety precautions to be maintained throughout the outpatient stay will include: orient to surroundings, keep bed in low position, maintain call bell within reach at all times, provide assistance with transfer out of bed and ambulation.  

## 2020-12-04 NOTE — Progress Notes (Signed)
Please offer HIV testing and complete baseline labs at TB med start appt.

## 2020-12-04 NOTE — Progress Notes (Signed)
Event organiser for TB RN:I was consulted and documentation reflects my recommendations for LTBI treatment  Federico Flake, MD, MPH, ABFM [facility] Medical Director

## 2020-12-04 NOTE — Progress Notes (Signed)
PROVIDER NOTE: Information contained herein reflects review and annotations entered in association with encounter. Interpretation of such information and data should be left to medically-trained personnel. Information provided to patient can be located elsewhere in the medical record under "Patient Instructions". Document created using STT-dictation technology, any transcriptional errors that may result from process are unintentional.    Patient: Candace Juarez  Service Category: E/M  Provider: Gillis Santa, MD  DOB: [birth year]  DOS: 12/04/2020  Specialty: Interventional Pain Management  MRN: [id number]  Setting: Ambulatory outpatient  PCP: Gladstone Lighter, MD  Type: Established Patient    Referring Provider: Gladstone Lighter, MD  Location: Office  Delivery: Face-to-face     Primary Reason(s) for Visit: Encounter for evaluation before starting new chronic pain management plan of care (Level of risk: moderate) CC: Chronic pain HPI  Candace Juarez is a 70 y.o. year old, female patient, who comes today for a follow-up evaluation to review the test results and decide on a treatment plan. 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 Her primarily concern today is the No chief complaint on file.  Pain Assessment: Location:   Hip Radiating: Denies but pain is speratic and sharp shooting in toes at times and joints throughout the body Onset: More than a month ago Duration: Chronic pain Quality: Sharp,Spasm,Aching,Radiating,Numbness Severity: 6 /10 (subjective, self-reported pain score)  Effect on ADL: "Miserably and at times hard to work in the house" Timing: Constant (but different area of the  body) Modifying factors: Hydrocodone, resting, heat and position changes BP: (!) 151/75  HR: 90  Candace Juarez comes in today for a follow-up visit after her initial evaluation on 11/21/2020.   Candace Juarez presents today for her second patient visit.  No significant change in her medical history since her initial visit with me.  Please see her initial clinic note from 11/21/2020.  She has completed her urine toxicology screen.  I counseled her on avoiding benzodiazepines while she is on chronic opioid therapy given risk of respiratory depression associated with both of these classes of drugs when taken together.  Patient endorsed understanding.  She has as needed orders in place for knee injections if she has worsening right knee pain.  Otherwise I will have Kyrah sign her pain contract today and I will take over hydrocodone at 7.5 mg 3 times daily as needed which was her prescription when she was in [place].  No change in dose since then.  Follow-up in 2 months for medication management or earlier if she would like to proceed with the injections.  Controlled Substance Pharmacotherapy Assessment REMS (Risk Evaluation and Mitigation Strategy)  Analgesic: Norco 7.5 mg 3 times daily as needed, quantity 90/month; MME equals 22.5   Pill Count: None expected due to no prior prescriptions written by our practice. Janne Napoleon, RN  12/04/2020 11:12 AM  Sign when Signing Visit Safety precautions to be maintained throughout the outpatient stay will include: orient to surroundings, keep bed in low position, maintain call bell within reach at all times, provide assistance with transfer out of bed and ambulation.    Pharmacokinetics: Liberation and absorption (onset of action): WNL Distribution (time to peak effect): WNL Metabolism and excretion (duration of action): WNL  Pharmacodynamics: Desired effects: Analgesia: Candace Juarez reports >50% benefit. Functional ability: Patient reports that  medication allows her to accomplish basic ADLs Clinically meaningful improvement in function (CMIF): Sustained CMIF goals met Perceived effectiveness: Described as relatively effective, allowing for increase in activities of daily living (ADL) Undesirable effects: Side-effects or Adverse reactions: None reported Monitoring: [place] PMP: PDMP not reviewed this encounter. Online review of the past 93-monthperiod previously conducted. Not applicable at this point since we have not taken over the patient's medication management yet. List of other Serum/Urine Drug Screening Test(s):  No results found for: AMPHSCRSER, BARBSCRSER, BENZOSCRSER, COCAINSCRSER, COCAINSCRNUR, PCPSCRSER, THCSCRSER, THCU, CANNABQUANT, [place] ENelsonList of all UDS test(s) done:  Lab Results  Component Value Date   SUMMARY Note 11/21/2020   Last UDS on record: Summary  Date Value Ref Range Status  11/21/2020 Note  Final    Comment:    ==================================================================== Compliance Drug Analysis, Ur ==================================================================== Test                             Result       Flag       Units  Drug Present and Declared for Prescription Verification   Alprazolam                     137          EXPECTED   ng/mg creat   Alpha-hydroxyalprazolam        123          EXPECTED   ng/mg creat    Source of alprazolam is a scheduled prescription medication. Alpha-    hydroxyalprazolam is an expected metabolite of alprazolam.    Hydrocodone                    1206         EXPECTED   ng/mg creat   Hydromorphone                  540          EXPECTED   ng/mg creat   Dihydrocodeine                 246          EXPECTED   ng/mg creat   Norhydrocodone                 1460         EXPECTED   ng/mg creat    Sources of hydrocodone include scheduled prescription medications.    Hydromorphone, dihydrocodeine and norhydrocodone are expected     metabolites of hydrocodone. Hydromorphone and dihydrocodeine are    also available as scheduled prescription medications.    Venlafaxine                    PRESENT      EXPECTED   Desmethylvenlafaxine           PRESENT      EXPECTED    Desmethylvenlafaxine is an expected metabolite of venlafaxine.    Acetaminophen                  PRESENT      EXPECTED  Drug Present not Declared for Prescription Verification   Gabapentin                     PRESENT  UNEXPECTED  Drug Absent but Declared for Prescription Verification   Diphenhydramine                Not Detected UNEXPECTED ==================================================================== Test                      Result    Flag   Units      Ref Range   Creatinine              35               mg/dL      >=20 ==================================================================== Declared Medications:  The flagging and interpretation on this report are based on the  following declared medications.  Unexpected results may arise from  inaccuracies in the declared medications.   **Note: The testing scope of this panel includes these medications:   Alprazolam  Diphenhydramine (Sominex)  Hydrocodone  Venlafaxine   **Note: The testing scope of this panel does not include small to  moderate amounts of these reported medications:   Acetaminophen   **Note: The testing scope of this panel does not include the  following reported medications:   Abatacept  Aluminum Hydroxide (Maalox)  Atropine (Lomotil)  Cetirizine (Zyrtec)  Diphenoxylate (Lomotil)  Docusate (Colace)  Estradiol (Estrace)  Eye Drop  Fluticasone  Hydroxychloroquine  Levothyroxine  Magnesium Hydroxide (Maalox)  Nystatin  Polyethylene Glycol  Solifenacin  Vitamin D2 ==================================================================== For clinical consultation, please call [phone]. ====================================================================     UDS interpretation: No unexpected findings.          Medication Assessment Form: Patient introduced to form today Treatment compliance: Treatment may start today if patient agrees with proposed plan. Evaluation of compliance is not applicable at this point Risk Assessment Profile: Aberrant behavior: See initial evaluations. None observed or detected today Comorbid factors increasing risk of overdose: See initial evaluation. No additional risks detected today Opioid risk tool (ORT):  Opioid Risk  12/04/2020  Alcohol 0  Illegal Drugs 0  Rx Drugs 0  Alcohol 0  Illegal Drugs 0  Rx Drugs 0  Age between 16-45 years  0  Psychological Disease 0  ADD -  OCD -  Bipolar -  Depression 1  Opioid Risk Tool Scoring 1  Opioid Risk Interpretation Low Risk    ORT Scoring interpretation table:  Score <3 = Low Risk for SUD  Score between 4-7 = Moderate Risk for SUD  Score >8 = High Risk for Opioid Abuse   Risk of substance use disorder (SUD): Low  Risk Mitigation Strategies:  Patient opioid safety counseling: Completed today. Counseling provided to patient as per "Patient Counseling Document". Document signed by patient, attesting to counseling and understanding Patient-Prescriber Agreement (PPA): Obtained today.  Controlled substance notification to other providers: Written and sent today.  Pharmacologic Plan: Today we may be taking over the patient's pharmacological regimen. See below.             Laboratory Chemistry Profile   Renal Lab Results  Component Value Date   BUN 16 10/25/2020   CREATININE 0.87 10/25/2020   GFRNONAA >60 10/25/2020   PROTEINUR NEGATIVE 09/06/2020     Electrolytes Lab Results  Component Value Date   NA 136 10/25/2020   K 4.2 10/25/2020   CL 100 10/25/2020   CALCIUM 8.3 (L) 10/25/2020     Hepatic Lab Results  Component Value Date   AST 23 09/06/2020   ALT 16 09/06/2020   ALBUMIN 3.8 09/06/2020  ALKPHOS 49 09/06/2020   LIPASE 28 09/06/2020      ID Lab Results  Component Value Date   SARSCOV2NAA NEGATIVE 10/22/2020     Bone No results found for: VD25OH, VD125OH2TOT, JK0938HW2, XH3716RC7, 25OHVITD1, 25OHVITD2, 25OHVITD3, TESTOFREE, TESTOSTERONE   Endocrine Lab Results  Component Value Date   GLUCOSE 124 (H) 10/25/2020   GLUCOSEU NEGATIVE 09/06/2020     Neuropathy No results found for: VITAMINB12, FOLATE, HGBA1C, HIV   CNS No results found for: COLORCSF, APPEARCSF, RBCCOUNTCSF, WBCCSF, POLYSCSF, LYMPHSCSF, EOSCSF, PROTEINCSF, GLUCCSF, JCVIRUS, CSFOLI, IGGCSF, LABACHR, ACETBL, LABACHR, ACETBL   Inflammation (CRP: Acute  ESR: Chronic) No results found for: CRP, ESRSEDRATE, LATICACIDVEN   Rheumatology No results found for: RF, ANA, LABURIC, URICUR, LYMEIGGIGMAB, LYMEABIGMQN, HLAB27   Coagulation Lab Results  Component Value Date   INR 1.0 09/06/2020   LABPROT 12.9 09/06/2020   PLT 210 10/25/2020     Cardiovascular Lab Results  Component Value Date   HGB 11.2 (L) 10/25/2020   HCT 34.5 (L) 10/25/2020     Screening Lab Results  Component Value Date   SARSCOV2NAA NEGATIVE 10/22/2020     Cancer No results found for: CEA, CA125, LABCA2   Allergens No results found for: ALMOND, APPLE, ASPARAGUS, AVOCADO, BANANA, BARLEY, BASIL, BAYLEAF, GREENBEAN, LIMABEAN, WHITEBEAN, BEEFIGE, REDBEET, BLUEBERRY, BROCCOLI, CABBAGE, MELON, CARROT, CASEIN, CASHEWNUT, CAULIFLOWER, CELERY     Note: Lab results reviewed.    Meds   Current Outpatient Medications:  .  Abatacept (ORENCIA IV), Inject 1,000 mg into the vein every 30 (thirty) days., Disp: , Rfl:  .  ALPRAZolam (XANAX) 0.5 MG tablet, Take 0.5 mg by mouth 3 (three) times daily as needed for anxiety., Disp: , Rfl:  .  aluminum-magnesium hydroxide-simethicone (MAALOX) 893-810-17 MG/5ML SUSP, Take 30 mLs by mouth daily as needed (heartburn)., Disp: , Rfl:  .  cetirizine (ZYRTEC) 10 MG tablet, Take 10 mg by mouth daily as needed for allergies., Disp: , Rfl:  .  cycloSPORINE  (RESTASIS) 0.05 % ophthalmic emulsion, Place 1 drop into both eyes 2 (two) times daily., Disp: , Rfl:  .  diphenhydrAMINE (SOMINEX) 25 MG tablet, Take 25 mg by mouth at bedtime as needed for sleep., Disp: , Rfl:  .  diphenoxylate-atropine (LOMOTIL) 2.5-0.025 MG tablet, Take 1 tablet by mouth daily as needed for diarrhea or loose stools., Disp: , Rfl:  .  docusate sodium (COLACE) 100 MG capsule, Take 1 capsule (100 mg total) by mouth 2 (two) times daily. To keep stools soft, Disp: 30 capsule, Rfl: 0 .  ergocalciferol (VITAMIN D2) 1.25 MG (50000 UT) capsule, Take 1,250 mcg by mouth once a week., Disp: , Rfl:  .  estradiol (ESTRACE) 2 MG tablet, Take 1 mg by mouth daily., Disp: , Rfl:  .  fluticasone (FLONASE) 50 MCG/ACT nasal spray, Place 1 spray into both nostrils daily as needed for allergies or rhinitis., Disp: , Rfl:  .  HYDROcodone-acetaminophen (NORCO) 7.5-325 MG tablet, Take 1 tablet by mouth 3 (three) times daily as needed for severe pain. Must last 30 days., Disp: 90 tablet, Rfl: 0 .  [START ON 01/03/2021] HYDROcodone-acetaminophen (NORCO) 7.5-325 MG tablet, Take 1 tablet by mouth 3 (three) times daily as needed for severe pain. Must last 30 days., Disp: 90 tablet, Rfl: 0 .  hydroxychloroquine (PLAQUENIL) 200 MG tablet, Take 200 mg by mouth 2 (two) times daily., Disp: , Rfl:  .  levothyroxine (SYNTHROID) 125 MCG tablet, Take 125 mcg by mouth daily before breakfast., Disp: , Rfl:  .  NYSTATIN powder, Apply 1 application topically 2 (two) times daily. , Disp: , Rfl:  .  Polyethyl Glycol-Propyl Glycol (SYSTANE ULTRA OP), Place 1 drop into both eyes daily as needed (dry eyes)., Disp: , Rfl:  .  solifenacin (VESICARE) 5 MG tablet, Take 5 mg by mouth daily., Disp: , Rfl:  .  venlafaxine XR (EFFEXOR-XR) 75 MG 24 hr capsule, Take 75 mg by mouth daily., Disp: , Rfl:   ROS  Constitutional: Denies any fever or chills Gastrointestinal: No reported hemesis, hematochezia, vomiting, or acute GI  distress Musculoskeletal: Diffuse myalgias Neurological: No reported episodes of acute onset apraxia, aphasia, dysarthria, agnosia, amnesia, paralysis, loss of coordination, or loss of consciousness  Allergies  Candace Juarez is allergic to sulfa antibiotics and nsaids.  [place]  Drug: Candace Juarez  reports no history of drug use. Alcohol:  reports no history of alcohol use. Tobacco:  reports that she has never smoked. She has never used smokeless tobacco. Medical:  has a past medical history of Arthritis, History of hiatal hernia, Hypothyroidism, Lupus ([place]), and Rheumatoid arthritis ([place]). Surgical: Candace Juarez  has a past surgical history that includes Joint replacement (Left); Cholesteatoma excision (Right); vericose; Colonoscopy; Breast enhancement surgery; Cholecystectomy; Hernia repair; Tubal ligation; Total laparoscopic hysterectomy with bilateral salpingo oophorectomy (Bilateral, 10/24/2020); and Cystocele repair (N/A, 10/24/2020). Family: family history is not on file.  Constitutional Exam  General appearance: Well nourished, well developed, and well hydrated. In no apparent acute distress Vitals:   12/04/20 1108  BP: (!) 151/75  Pulse: 90  Resp: 18  Temp: (!) 97.2 F (36.2 C)  TempSrc: Temporal  SpO2: 100%  Weight: 230 lb (104.3 kg)  Height: _0  (1.575 m)   BMI Assessment: Estimated body mass index is 42.07 kg/m as calculated from the following:   Height as of this encounter: _1  (1.575 m).   Weight as of this encounter: 230 lb (104.3 kg).  BMI interpretation table: BMI level Category Range association with higher incidence of chronic pain  <18 kg/m2 Underweight   18.5-24.9 kg/m2 Ideal body weight   25-29.9 kg/m2 Overweight Increased incidence by 20%  30-34.9 kg/m2 Obese (Class I) Increased incidence by 68%  35-39.9 kg/m2 Severe obesity (Class II) Increased incidence by 136%  >40 kg/m2 Extreme obesity (Class III) Increased incidence by 254%   Patient's current BMI Ideal Body  weight  Body mass index is 42.07 kg/m. Ideal body weight: 50.1 kg (110 lb 7.2 oz) Adjusted ideal body weight: 71.8 kg (158 lb 4.3 oz)   BMI Readings from Last 4 Encounters:  12/04/20 42.07 kg/m  11/21/20 42.70 kg/m  11/11/20 41.34 kg/m  10/24/20 40.81 kg/m   Wt Readings from Last 4 Encounters:  12/04/20 230 lb (104.3 kg)  11/21/20 226 lb (102.5 kg)  11/11/20 226 lb (102.5 kg)  10/24/20 223 lb 1.7 oz (101.2 kg)    Psych/Mental status: Alert, oriented x 3 (person, place, & time)       Eyes: PERLA Respiratory: No evidence of acute respiratory distress  Cervical Spine Exam  Skin & Axial Inspection: No masses, redness, edema, swelling, or associated skin lesions Alignment: Symmetrical Functional ROM: Unrestricted ROM      Stability: No instability detected Muscle Tone/Strength: Functionally intact. No obvious neuro-muscular anomalies detected. Sensory (Neurological): Musculoskeletal pain pattern Palpation: No palpable anomalies                    Upper Extremity (UE) Exam    Side: Right upper extremity  Side:  Left upper extremity   Skin & Extremity Inspection: Skin color, temperature, and hair growth are WNL. No peripheral edema or cyanosis. No masses, redness, swelling, asymmetry, or associated skin lesions. No contractures.  Skin & Extremity Inspection: Skin color, temperature, and hair growth are WNL. No peripheral edema or cyanosis. No masses, redness, swelling, asymmetry, or associated skin lesions. No contractures.   Functional ROM: Unrestricted ROM          Functional ROM: Unrestricted ROM           Muscle Tone/Strength: Functionally intact. No obvious neuro-muscular anomalies detected.   Muscle Tone/Strength: Functionally intact. No obvious neuro-muscular anomalies detected.   Sensory (Neurological): Musculoskeletal pain pattern          Sensory (Neurological): Musculoskeletal pain pattern           Palpation: No palpable anomalies              Palpation: No  palpable anomalies                Thoracic Spine Area Exam  Skin & Axial Inspection: No masses, redness, or swelling Alignment: Symmetrical Functional ROM: Unrestricted ROM Stability: No instability detected Muscle Tone/Strength: Functionally intact. No obvious neuro-muscular anomalies detected. Sensory (Neurological): Unimpaired  Lumbar Exam  Skin & Axial Inspection: No masses, redness, or swelling Alignment: Symmetrical Functional ROM: Unrestricted ROM       Stability: No instability detected Muscle Tone/Strength: Functionally intact. No obvious neuro-muscular anomalies detected. Sensory (Neurological): Musculoskeletal pain pattern   Gait & Posture Assessment  Ambulation: Unassisted Gait: Relatively normal for age and body habitus Posture: WNL   Lower Extremity Exam    Side: Right lower extremity  Side: Left lower extremity  Stability: No instability observed          Stability: No instability observed          Skin & Extremity Inspection: Skin color, temperature, and hair growth are WNL. No peripheral edema or cyanosis. No masses, redness, swelling, asymmetry, or associated skin lesions. No contractures.  Skin & Extremity Inspection: Evidence of prior arthroplastic surgery  Functional ROM: Pain restricted ROM for hip and knee joints          Functional ROM: Unrestricted ROM                  Muscle Tone/Strength: Functionally intact. No obvious neuro-muscular anomalies detected.  Muscle Tone/Strength: Functionally intact. No obvious neuro-muscular anomalies detected.  Sensory (Neurological): Unimpaired        Sensory (Neurological): Arthropathic arthralgia knee        DTR: Patellar: deferred today Achilles: deferred today Plantar: deferred today  DTR: Patellar: deferred today Achilles: deferred today Plantar: deferred today  Palpation: No palpable anomalies  Palpation: No palpable anomalies   Assessment & Plan  Primary Diagnosis & Pertinent Problem  List: The primary encounter diagnosis was Rheumatoid arthritis involving multiple sites, unspecified whether rheumatoid factor present ([place]). Diagnoses of Polyarthralgia, History of left hip replacement, H/O right knee surgery, Pain management contract signed, Chronic pain of both knees, Chronic pain of both hips, and Chronic pain syndrome were also pertinent to this visit.  Visit Diagnosis: 1. Rheumatoid arthritis involving multiple sites, unspecified whether rheumatoid factor present ([place])   2. Polyarthralgia   3. History of left hip replacement   4. H/O right knee surgery   5. Pain management contract signed   6. Chronic pain of both knees   7. Chronic pain of both hips   8. Chronic pain  syndrome    Problems updated and reviewed during this visit: Problem  Pain Management Contract Signed    Plan of Care  Pharmacotherapy (Medications Ordered): Meds ordered this encounter  Medications  . HYDROcodone-acetaminophen (NORCO) 7.5-325 MG tablet    Sig: Take 1 tablet by mouth 3 (three) times daily as needed for severe pain. Must last 30 days.    Dispense:  90 tablet    Refill:  0    Chronic Pain. (STOP Act - Not applicable). Fill one day early if closed on scheduled refill date.  Marland Kitchen HYDROcodone-acetaminophen (NORCO) 7.5-325 MG tablet    Sig: Take 1 tablet by mouth 3 (three) times daily as needed for severe pain. Must last 30 days.    Dispense:  90 tablet    Refill:  0    Chronic Pain. (STOP Act - Not applicable). Fill one day early if closed on scheduled refill date.   Pharmacological management options:  Opioid Analgesics:  Norco 7.5 mg 3 times daily as needed, quantity 90/month.   Membrane stabilizer: Has tried gabapentin in the past.  Currently on Effexor.  Avoid Cymbalta.  Can consider Lyrica trial in the future  Muscle relaxant: To be determined at a later time  NSAID: To be determined at a later time  Other analgesic(s): To be determined at a later time   Interventional  management options: Candace Juarez was informed that there is no guarantee that she would be a candidate for interventional therapies. The decision will be based on the results of diagnostic studies, as well as Candace Juarez's risk profile.  Procedure(s) under consideration:  (As needed) Left knee Hyalgan Left knee genicular nerve block Right hip intra-articular steroid   Provider-requested follow-up: Return in about 8 weeks (around 01/29/2021) for Medication Management, in person. Recent Visits Date Type Provider Dept  11/21/20 Office Visit Gillis Santa, MD [facility]  Showing recent visits within past 90 days and meeting all other requirements Today's Visits Date Type Provider Dept  12/04/20 Office Visit Gillis Santa, MD [facility]  Showing today's visits and meeting all other requirements Future Appointments Date Type Provider Dept  01/28/21 Appointment Gillis Santa, MD [facility]  Showing future appointments within next 90 days and meeting all other requirements  Primary Care Physician: Gladstone Lighter, MD Note by: Gillis Santa, MD Date: 12/04/2020; Time: 1:04 PM

## 2020-12-04 NOTE — Patient Instructions (Signed)
1. Sign contract 2. PRN knee injections 3. Follow up in 2 months

## 2020-12-04 NOTE — Consult Note (Signed)
Tuberculosis treatment orders  All patients are to be monitored per [place] and county TB policies.   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 Treat for latent TB per the following:  Rifampin 600mg  daily by mouth x 4 months, draw LFTs monthly per Dr. .   Referred by Dr. Alvester Morin [facility]).  +QFT 10/23/2020  CXR without Active TB on 10/29/2020 [facility])

## 2020-12-06 ENCOUNTER — Other Ambulatory Visit: Payer: Self-pay

## 2020-12-06 ENCOUNTER — Ambulatory Visit (LOCAL_COMMUNITY_HEALTH_CENTER): Payer: Medicare Other

## 2020-12-06 VITALS — Ht 62.0 in | Wt 234.5 lb

## 2020-12-06 DIAGNOSIS — R7612 Nonspecific reaction to cell mediated immunity measurement of gamma interferon antigen response without active tuberculosis: Secondary | ICD-10-CM

## 2020-12-06 MED ORDER — RIFAMPIN 300 MG PO CAPS: 600.0000 mg | ORAL_CAPSULE | Freq: Every day | ORAL | 1 refills | Status: AC

## 2020-12-06 NOTE — Progress Notes (Signed)
Patient sent to the lab for CBC w/ platelets, LFT's, HIV and RPR today.  LTBI Tx started today per 12/04/2020 orders by Lyndel Safe, MD.   Patient concerned about staining of her Cataract lenses.  She will call her eye doctor prior to starting Rifampin to inquire. Patient to call Marcelino Duster the TB Coordinator with any concerns. Hart Carwin, RN

## 2020-12-07 LAB — HEPATIC FUNCTION PANEL
ALT: 21 IU/L (ref 0–32)
AST: 21 IU/L (ref 0–40)
Albumin: 4.3 g/dL (ref 3.7–4.7)
Alkaline Phosphatase: 70 IU/L (ref 44–121)
Bilirubin Total: 0.3 mg/dL (ref 0.0–1.2)
Bilirubin, Direct: <0.1 mg/dL (ref 0.00–0.40)
Total Protein: 6.6 g/dL (ref 6.0–8.5)

## 2020-12-07 LAB — CBC WITH DIFFERENTIAL/PLATELET
Basophils Absolute: 0.1 10*3/uL (ref 0.0–0.2)
Basos: 1 %
EOS (ABSOLUTE): 0.4 10*3/uL (ref 0.0–0.4)
Eos: 6 %
Hematocrit: 40.5 % (ref 34.0–46.6)
Hemoglobin: 13.1 g/dL (ref 11.1–15.9)
Immature Grans (Abs): 0 10*3/uL (ref 0.0–0.1)
Immature Granulocytes: 0 %
Lymphocytes Absolute: 2.1 10*3/uL (ref 0.7–3.1)
Lymphs: 35 %
MCH: 29.6 pg (ref 26.6–33.0)
MCHC: 32.3 g/dL (ref 31.5–35.7)
MCV: 92 fL (ref 79–97)
Monocytes Absolute: 0.3 10*3/uL (ref 0.1–0.9)
Monocytes: 5 %
Neutrophils Absolute: 3.2 10*3/uL (ref 1.4–7.0)
Neutrophils: 53 %
Platelets: 265 10*3/uL (ref 150–450)
RBC: 4.42 x10E6/uL (ref 3.77–5.28)
RDW: 13.4 % (ref 11.7–15.4)
WBC: 6.1 10*3/uL (ref 3.4–10.8)

## 2020-12-11 ENCOUNTER — Ambulatory Visit: Payer: Medicare Other | Admitting: Physical Therapy

## 2020-12-16 ENCOUNTER — Telehealth: Payer: Self-pay

## 2020-12-16 NOTE — Telephone Encounter (Signed)
Patient reports since starting Rifampin around a week or so ago she has experienced night sweats, feels like she will faint at night, blurred vision x 2, headache, scalp itching and ear itching since Sunday morning, legs crawling at night- "creepy crawly", diarrhea, insomnia and horrible heartburn.  Denies abd pain and jaundice of eyes/skin. Instructed patient to stop Rifampin and scheduled for LFTs.  Patient reports typically takes Rifampin ~6-7pm with dinner. Richmond Campbell, RN   TB RN will consult with provider once lab results complete.  Instructed patient to start antihistamine for itching.  Richmond Campbell, RN

## 2020-12-17 ENCOUNTER — Other Ambulatory Visit (LOCAL_COMMUNITY_HEALTH_CENTER): Payer: Medicare Other

## 2020-12-17 DIAGNOSIS — R7612 Nonspecific reaction to cell mediated immunity measurement of gamma interferon antigen response without active tuberculosis: Secondary | ICD-10-CM

## 2020-12-17 NOTE — Progress Notes (Signed)
See 12/16/20 phone note.  Patient reports still with scalp itching and diarrhea.  States eating a lot of cheese.  Co patient that typically for diarrhea, avoid dairy and education on [employer] TB RN will call patient once lab results are complete and not to start back Rifampin until directed Richmond Campbell, RN

## 2020-12-18 LAB — HEPATIC FUNCTION PANEL
ALT: 22 IU/L (ref 0–32)
AST: 31 IU/L (ref 0–40)
Albumin: 4.2 g/dL (ref 3.7–4.7)
Alkaline Phosphatase: 71 IU/L (ref 44–121)
Bilirubin Total: <0.2 mg/dL (ref 0.0–1.2)
Bilirubin, Direct: <0.1 mg/dL (ref 0.00–0.40)
Total Protein: 6.8 g/dL (ref 6.0–8.5)

## 2020-12-19 ENCOUNTER — Telehealth: Payer: Self-pay

## 2020-12-19 NOTE — Telephone Encounter (Signed)
TC with patient re: normal LFTs and possible side effects from medications.  Patient states she is feeling much better, still having itching on scalp but no itching on stomach now.  Stools are almost normal.  Explained that the symptoms earlier in the week most likely are pain med reactions per Dr. Alvester Morin.  Patient states she has taken same pain med for years.   Co patient after the weekend, restart Rifampin, and see how she feels.  Patient verbalized understanding to call TB RN after a couple of days and report. If symptoms start back up, stop med and call TB RN.  Patient agrees with plan. Richmond Campbell, RN

## 2020-12-24 ENCOUNTER — Encounter: Payer: Medicare Other | Admitting: Physical Therapy

## 2020-12-24 ENCOUNTER — Ambulatory Visit
Payer: Medicare Other | Attending: Student in an Organized Health Care Education/Training Program | Admitting: Physical Therapy

## 2020-12-24 ENCOUNTER — Other Ambulatory Visit: Payer: Self-pay

## 2020-12-24 ENCOUNTER — Encounter: Payer: Self-pay | Admitting: Physical Therapy

## 2020-12-24 DIAGNOSIS — R2681 Unsteadiness on feet: Secondary | ICD-10-CM | POA: Diagnosis present

## 2020-12-24 DIAGNOSIS — R29898 Other symptoms and signs involving the musculoskeletal system: Secondary | ICD-10-CM | POA: Insufficient documentation

## 2020-12-24 DIAGNOSIS — M6281 Muscle weakness (generalized): Secondary | ICD-10-CM | POA: Diagnosis not present

## 2020-12-24 DIAGNOSIS — R262 Difficulty in walking, not elsewhere classified: Secondary | ICD-10-CM | POA: Insufficient documentation

## 2020-12-24 DIAGNOSIS — M79609 Pain in unspecified limb: Secondary | ICD-10-CM | POA: Insufficient documentation

## 2020-12-24 NOTE — Therapy (Signed)
[facility] PHYSICAL AND SPORTS MEDICINE [address] [place], [address] Phone: [phone]   Fax:  [phone]  Physical Therapy Evaluation  Patient Details  Name: Candace Juarez MRN: [id number] Date of Birth: Jan 16, 1951 Referring Provider (PT): Edward Jolly, MD   Encounter Date: 12/24/2020   PT End of Session - 12/24/20 1848    Visit Number 1    Number of Visits 24    Date for PT Re-Evaluation 03/18/21    Authorization Type Medicare reporting period from 12/24/2020    Progress Note Due on Visit 10    PT Start Time 1435    PT Stop Time 1510    PT Time Calculation (min) 35 min    Activity Tolerance Patient tolerated treatment well    Behavior During Therapy [facility] for tasks assessed/performed           Past Medical History:  Diagnosis Date  . Arthritis   . History of hiatal hernia   . Hypothyroidism   . Lupus (HCC)   . Rheumatoid arthritis [facility])     Past Surgical History:  Procedure Laterality Date  . BREAST ENHANCEMENT SURGERY    . CHOLECYSTECTOMY    . CHOLESTEATOMA EXCISION Right    x2  . COLONOSCOPY    . CYSTOCELE REPAIR N/A 10/24/2020   Procedure: ANTERIOR REPAIR (CYSTOCELE);  Surgeon: Christeen Douglas, MD;  Location: [facility] ORS;  Service: Gynecology;  Laterality: N/A;  . HERNIA REPAIR    . JOINT REPLACEMENT Left    x2  . TOTAL LAPAROSCOPIC HYSTERECTOMY WITH BILATERAL SALPINGO OOPHORECTOMY Bilateral 10/24/2020   Procedure: TOTAL LAPAROSCOPIC HYSTERECTOMY WITH BILATERAL SALPINGO OOPHORECTOMY;  Surgeon: Christeen Douglas, MD;  Location: [facility] ORS;  Service: Gynecology;  Laterality: Bilateral;  . TUBAL LIGATION    . vericose      There were no vitals filed for this visit.    Subjective Assessment - 12/24/20 1857    Subjective Patient reports she had an emergency hysterectomy in December 2021 and she was unable to use any muscles, etc, for 8 weeks. She had to have a bladder tuck as well. She has just gotten over that. She  was told to rest in bed and not do any bending, lifting, twisting. She went for her check up and told to continue what she was doing. She got a lot of atrophy and feeling really weak and her usual pain from RA/Lupus got worse with inactivity. She talked to Dr. Cherylann Ratel who referred her to PT. She is from [place], but moved back from [place] in September. The cold also causes more aches, pains, stiffness. States her ROM has deteriorated to the point that she has to stay in the same position for quite a while standing there before she can start walking. She has to use both UEs to lift a gallon of milk (used to be able to do it with one).     Has notices more stiffness in left hip to knee and has a history of 2 surgeries in the left hip (THA 2008, hip recall caused 2010 revision of new device). She is having a lot of trouble getting her shoes on and just use her hamstring flexibility to get there due to difficulty bringing L knee and foot towards her. Her hands are very affected by RA and it compromises her dexterity (not as concerned about hands). Very concerned about getting flexibility in the left hip/side. She has gained weight during COVID/hysterectomy and that is impeding her motion/flexibility as  well .She is now on a treatment for latent TB (not contagious), because her rheumatologist found in a blood test that she has latent TB. It is a "horrific treatment" for it called Rifampin which is giving her a lot of side effects, but she has to complete the treatment in order to get back on her usual biologic that manages her autoimmune disorders. Since she is unable to take the bioilogic infusion treatment (keeps inflammation down) all of her symptoms are exacerbated which has made it even harder to move and function. Dr. Cherylann Ratel wants her to get the pain down so she doesn't have to take as much medication. She got Delta variant of COVID19 in August 2021. Then she had to move to [place], then her cat died within a week, and then  her furniture did not get delivered for 2 weeks, another cat died. It has been a really hard few months. Many unexpected things happening and tearing down her usual function. She has been released from restrictions from hysterectomy. She currently has no pain in that area. Does not have any other restrictions that she knows of. Is very interested in pelvic floor PT after she gets some of her function back. She is a former Systems analyst.   Pertinent History Patient is a 70 y.o. female who presents to outpatient physical therapy with a referral for medical diagnosis rheumatoid artthritis involving multiple sites, chronic pain of both knees, chronic pain of both hips. This patient's chief complaints consist of debility and functional decline after multiple negative events since Summer 2021 and chronic contributing conditions causing increased pain leading to the following functional deficits: difficulty with ADLs, IADLs, bed mobility, transfers, household and community mobility, stairs, feels debilitated..  Relevant past medical history and comorbidities include hx Hysterectomy 10/24/2020 (released from restrictions), L THA (2008 and 2010), R knee surgery, latent TB, OA, RA, Lupus, chronic pain syndrome, hiatal hernia, heart burn, medication side effects,  .  Patient denies hx of cancer, stroke, seizures, lung problem, major cardiac events, diabetes, unexplained weight loss, changes in bowel or bladder problems, new onset stumbling or dropping things apart from described below.    Limitations House hold activities;Lifting;Standing;Walking;Other (comment)   Functional Limitations: difficulty with ADLs, IADLs, bed mobility, transfers, household and community mobility, stairs, feels debilitated. Putting on shoes/socks, lifting milk.   Diagnostic tests Bone density scan 11/12/2020: "INTERPRETATION:  Normal Bone Density. S/P  Left hip surgery, therefore   right is reported"    Patient Stated Goals States she wants to  remain independent, does not want to depend on a walker. She knows she can get back to strength, flexibility, balance, that kind of thing. She is looknig for a professional that understands aging and a few things out of the norm that can help her recover some of this stuff. She can do some exercises herself but she needs some supporting hands to guide her and suggest different exercises. 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    Currently in Pain? Yes    Pain Score 1    9/10 worst, current/best 1/10   Pain Location --   Throughout body, but most prominent at L hip region currently   Pain Descriptors / Indicators Aching;Sore   stiffness, pains   Pain Type Chronic pain    Pain Onset More than a month ago    Pain Frequency Constant    Aggravating Factors  Not enough exercise, too much activity, cold weather.  Pain Relieving Factors movement, rest, medications,    Effect of Pain on Daily Activities Functional Limitations: difficulty with ADLs, IADLs, bed mobility, transfers, household and community mobility, stairs. "feels debilitated"              [facility] PT Assessment - 12/24/20 1452      Assessment   Medical Diagnosis rheumatoid artthritis involving multiple sites, chronic pain of both knees, chronic pain of both hips.    Referring Provider (PT) Edward Jolly, MD    Onset Date/Surgical Date 05/09/20    Prior Therapy none for this problem prior to current episode of care      Precautions   Precautions Fall;Other (comment)   recent hysterectomy 10/24/2020 but precautions lifted after 8 weeks     Restrictions   Weight Bearing Restrictions No      Balance Screen   Has the patient fallen in the past 6 months No    Has the patient had a decrease in activity level because of a fear of falling?  Yes    Is the patient reluctant to leave their home because of a fear of falling?  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 Posture: forward head, rounded shoulders, slumped in sitting.  . Tremor: none . Muscle bulk: difficult to assess due to body habitus . Bed mobility: supine <> sit and rolling mod I for prolonged time, painful.  . Transfers: sit <> stand mod I With increased time.  . Gait: ambulates with antalgic gait favoring L LE.   Marland Kitchen Stairs: ascend/decent 4 steps (6 inches) step over step carefully with BUE support on railings (states her stairs at home are much steeper).   NEUROLOGICAL Dermatomes . L2-S2 appears equal and intact to light touch. Myotomes . L2-S2 appears intact  PERIPHERAL JOINT MOTION (in degrees) Passive Range of Motion (PROM) Comments: B LE and UE appear grossly WFL for very basic functional mobility but painful. More detailed assessment deferred due to time limitations.   MUSCLE PERFORMANCE (MMT):  *Indicates pain 12/24/20 Date Date  Joint/Motion R/L R/L R/L  Hip     Flexion 4/3+ / /  Extension (knee ext) 4+/4+ / /  Abduction 4/4 / /  Knee     Extension 5/5 / /  Flexion 4+/4+ / /  Ankle/Foot     Great toe extension 4/4 / /  Eversion 4+/4+ / /  Comments: able to heel and toe walk without UE support.   FUNCTIONAL/BALANCE TESTS:  Five Time Sit to Stand (5TSTS): 18 seconds from 18.5 inch plinth  (states her sofa is lower).  Timed Up and Go (TUG): 12 seconds (no UE support),10 seconds (B UE support on chair arms).   EDUCATION/COGNITION: Patient is alert and oriented X 4.   Objective measurements completed on examination: See above findings.       PT Education - 12/24/20 1847    Education Details examination purpose/form. Self management techniques. Education on diagnosis, prognosis, POC, anatomy and physiology of current condition Education on HEP    Person(s) Educated Patient    Methods Explanation;Demonstration;Tactile cues;Verbal cues    Comprehension Verbalized understanding;Returned demonstration;Verbal cues required;Need further instruction            PT Short Term Goals - 12/24/20 1849      PT SHORT TERM GOAL #1   Title Be independent with initial home exercise program for self-management of symptoms.    Baseline Initial HEP provided at IE (12/24/2020);    Time 2    Period Weeks    Status New    Target Date 01/07/21             PT Long Term Goals - 12/24/20 1850      PT LONG TERM GOAL #1   Title Be independent with a long-term home exercise program for self-management of symptoms    Baseline Initial HEP provided at IE (12/24/2020);    Time 12    Period Weeks    Status New   TARGET DATE FOR ALL LONG TERM GOALS: 03/18/2021     PT LONG TERM GOAL #2   Title Demonstrate improved FOTO score to equal or greater than 52 by visit # 17 to demonstrate improvement in overall condition and self-reported functional ability.    Baseline 48 (12/24/2020);    Time 12    Period Weeks    Status New      PT LONG TERM GOAL #3   Title Improve Abbreviated ABC scale to equal or greater than 50% to improve confidence in balance in daily activities and show improved fall risk.    Baseline Abbreviated ABC 26.7% (12/24/2020);    Time 12    Period Weeks    Status New      PT LONG TERM GOAL #4   Title Patient will complete 5 Time Sit To Stand test in equal or less than 15  seconds with no UE support from 18.5 inch surface to demonstrate improved LE functional strength and ability to move around her home and the community safety.    Baseline 18 seconds from 18.5 inch plinth no UE support (12/24/2020);    Time 12    Period Weeks    Status New      PT LONG TERM GOAL #5   Title Patient will ambulate at least 1000 feet on 6 Minute Walk Test to demonstrate improved household and community mobility.    Baseline to be tested visit 2 as appropriate (12/24/2020);    Time 12    Period Weeks    Status New      Additional Long Term Goals   Additional Long Term Goals Yes      PT LONG TERM GOAL #6   Title Complete community, work and/or recreational activities without limitation due to current condition.    Baseline Functional Limitations: difficulty with ADLs, IADLs, bed mobility, transfers, household and community mobility, stairs, etc (12/24/2020);    Time 12    Period Weeks    Status New  Plan - 12/24/20 1923    Clinical Impression Statement Patient is a 70 y.o. female referred to outpatient physical therapy with a medical diagnosis of rheumatoid artthritis involving multiple sites, chronic pain of both knees, chronic pain of both hips who presents with signs and symptoms consistent with chronic pain and deconditioning in the setting of multiple contributing factors including exacerbations of chronic autoimmune diseases, repeated negative psychosocial events, recent surgeries and changes in medications due to need to treat latent TB. Marland Kitchen Patient presents with significant pain, posture, ROM, joint stiffness, muscle performance (strength/power/endurance), balance, muscle tension, and activity tolerance impairments that are limiting ability to complete her usual activities including ADLs (putting on shoes and socks, moving the milk), IADLs, bed mobility, transfers, household and community mobility, stairs, without difficulty and is experiencing debility.  Patient will benefit from skilled physical therapy intervention to address current body structure impairments and activity limitations to improve function and work towards goals set in current POC in order to return to prior level of function or maximal functional improvement.    Personal Factors and Comorbidities Age;Comorbidity 3+;Past/Current Experience;Fitness;Time since onset of injury/illness/exacerbation    Comorbidities Relevant past medical history and comorbidities include hx Hysterectomy 10/24/2020 (released from restrictions), L THA (2008 and 2010), R knee surgery, latent TB, OA, RA, Lupus, chronic pain syndrome, hiatal hernia, heart burn, medication side effects,    Examination-Activity Limitations Bathing;Stand;Lift;Bed Mobility;Locomotion Level;Toileting;Bend;Transfers;Carry;Sit;Squat;Dressing;Hygiene/Grooming;Stairs    Examination-Participation Restrictions Community Activity;Cleaning;Meal Prep;Yard Work;Interpersonal Relationship;Laundry;Shop    Stability/Clinical Decision Making Evolving/Moderate complexity    Clinical Decision Making Moderate    Rehab Potential Good    PT Frequency 2x / week    PT Duration 12 weeks    PT Treatment/Interventions ADLs/Self Care Home Management;Aquatic Therapy;Cryotherapy;Moist Heat;Electrical Stimulation;DME Instruction;Gait training;Stair training;Functional mobility training;Therapeutic activities;Balance training;Therapeutic exercise;Neuromuscular re-education;Patient/family education;Manual techniques;Passive range of motion;Dry needling;Spinal Manipulations;Joint Manipulations    PT Next Visit Plan test baseline, update HEP, further examination of hips, functional strengthening,    PT Home Exercise Plan verbal: 5-10 reps of sit <> Stand 3 times a day    Recommended Other Services Consider pelvic floor PT when patinet has completed current episode of care    Consulted and Agree with Plan of Care Patient           Patient will benefit  from skilled therapeutic intervention in order to improve the following deficits and impairments:  Abnormal gait,Pain,Improper body mechanics,Cardiopulmonary status limiting activity,Decreased mobility,Decreased activity tolerance,Decreased endurance,Decreased range of motion,Decreased strength,Hypomobility,Impaired perceived functional ability,Impaired UE functional use,Decreased balance,Difficulty walking,Impaired flexibility,Obesity  Visit Diagnosis: Muscle weakness (generalized)  Pain in extremity, unspecified extremity  Other symptoms and signs involving the musculoskeletal system  Difficulty in walking, not elsewhere classified  Unsteadiness on feet     Problem List Patient Active Problem List   Diagnosis Date Noted  . Pain management contract signed 12/04/2020  . Rheumatoid arthritis involving multiple sites (HCC) 11/21/2020  . Polyarthralgia 11/21/2020  . History of left hip replacement 11/21/2020  . H/O right knee surgery 11/21/2020  . Chronic pain of both knees 11/21/2020  . Chronic pain of both hips 11/21/2020  . Chronic pain syndrome 11/21/2020  . 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 11/11/2020  . Postmenopausal bleeding 10/24/2020   Candace Juarez. Ilsa Iha, PT, DPT 12/24/20, 7:29 PM  [facility] PHYSICAL AND SPORTS MEDICINE [address] [place], [address] Phone: [phone]   Fax:  [phone]  Name: Candace Juarez MRN: [id number] Date of Birth: [birth year]

## 2020-12-26 ENCOUNTER — Telehealth: Payer: Self-pay | Admitting: Family Medicine

## 2020-12-26 ENCOUNTER — Ambulatory Visit: Payer: Medicare Other | Admitting: Physical Therapy

## 2020-12-26 NOTE — Telephone Encounter (Signed)
Pts. primary Dr. wants a call back to discuss side effects of the TB medication.

## 2020-12-30 NOTE — Telephone Encounter (Signed)
Attempted TC to Dr. Nemiah Commander. LMTC TB RN Richmond Campbell, RN    TC with patient re: restart of Rifampin.  Patient states PCP instructed her to stop Rifampin again and dispose of Rifampin.  C/O rash and itching on scalp and has swollen lymph node in neck; has u/s appt soon.  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  TB RN will reach out to PCP and then f/u with patient.Richmond Campbell, RN

## 2021-01-01 ENCOUNTER — Ambulatory Visit: Payer: Medicare Other | Admitting: Physical Therapy

## 2021-01-02 ENCOUNTER — Telehealth: Payer: Self-pay | Admitting: Student in an Organized Health Care Education/Training Program

## 2021-01-02 ENCOUNTER — Telehealth: Payer: Self-pay | Admitting: Physical Therapy

## 2021-01-02 NOTE — Telephone Encounter (Signed)
Pt states she needs a refill on her Hydrocodone 7.5 MG

## 2021-01-02 NOTE — Telephone Encounter (Signed)
Patient notified that she had a prescription for Hydrocodone at her pharmacy that can be filled tomorrow according to chart.

## 2021-01-02 NOTE — Telephone Encounter (Signed)
Called patient to check on her prior to her appointment scheduled for tomorrow. She answered and said she has had a reaction to her medication that she thinks will take some time to resolve and will preventing her from attending to PT. She requested to cancel all appointments at this time and will call back if she feels better and continues to need PT. Understands she may have to wait to get back on the schedule.  Updated office staff.   Luretha Murphy. Ilsa Iha, PT, DPT 01/02/21, 3:36 PM

## 2021-01-03 ENCOUNTER — Ambulatory Visit: Payer: Medicare Other | Admitting: Physical Therapy

## 2021-01-03 ENCOUNTER — Encounter: Payer: Self-pay | Admitting: Physical Therapy

## 2021-01-03 DIAGNOSIS — R29898 Other symptoms and signs involving the musculoskeletal system: Secondary | ICD-10-CM

## 2021-01-03 DIAGNOSIS — R2681 Unsteadiness on feet: Secondary | ICD-10-CM

## 2021-01-03 DIAGNOSIS — M6281 Muscle weakness (generalized): Secondary | ICD-10-CM

## 2021-01-03 DIAGNOSIS — M79609 Pain in unspecified limb: Secondary | ICD-10-CM

## 2021-01-03 DIAGNOSIS — R262 Difficulty in walking, not elsewhere classified: Secondary | ICD-10-CM

## 2021-01-03 NOTE — Therapy (Signed)
[facility] [address] [place], [address] Phone: [phone]   Fax:  [phone]  Physical Therapy No-Visit Discharge Summary Dates of reporting period: 12/24/2020 - 01/03/2021  Patient Details  Name: Candace Juarez MRN: [id number] Date of Birth: May 27, 1951 Referring Provider (PT): Gillis Santa, MD   Encounter Date: 01/03/2021    Past Medical History:  Diagnosis Date  . Arthritis   . History of hiatal hernia   . Hypothyroidism   . Lupus ([place])   . Rheumatoid arthritis [facility])     Past Surgical History:  Procedure Laterality Date  . BREAST ENHANCEMENT SURGERY    . CHOLECYSTECTOMY    . CHOLESTEATOMA EXCISION Right    x2  . COLONOSCOPY    . CYSTOCELE REPAIR N/A 10/24/2020   Procedure: ANTERIOR REPAIR (CYSTOCELE);  Surgeon: Benjaman Kindler, MD;  Location: [facility] ORS;  Service: Gynecology;  Laterality: N/A;  . HERNIA REPAIR    . JOINT REPLACEMENT Left    x2  . TOTAL LAPAROSCOPIC HYSTERECTOMY WITH BILATERAL SALPINGO OOPHORECTOMY Bilateral 10/24/2020   Procedure: TOTAL LAPAROSCOPIC HYSTERECTOMY WITH BILATERAL SALPINGO OOPHORECTOMY;  Surgeon: Benjaman Kindler, MD;  Location: [facility] OR[facility];  Service: Gynecology;  Laterality: Bilateral;  . TUBAL LIGATION    . vericose      There were no vitals filed for this visit.   Subjective Assessment - 01/03/21 0916    Subjective Spoke with patient by phone. Patient reports she has had a reaction to her medication that she thinks will take some time to resolve and will preventing her from attending future PT. She requested to cancel all appointments at this time and will call back if she feels better and continues to need PT. Understands she may have to wait to get back on the schedule and may need a new referral.    Pertinent History Patient is a 70 y.o. female who presents to outpatient physical therapy with a referral for medical diagnosis rheumatoid artthritis involving  multiple sites, chronic pain of both knees, chronic pain of both hips. This patient's chief complaints consist of debility and functional decline after multiple negative events since Summer 2021 and chronic contributing conditions causing increased pain leading to the following functional deficits: difficulty with ADLs, IADLs, bed mobility, transfers, household and community mobility, stairs, feels debilitated..  Relevant past medical history and comorbidities include hx Hysterectomy 10/24/2020 (released from restrictions), L THA (2008 and 2010), R knee surgery, latent TB, OA, RA, Lupus, chronic pain syndrome, hiatal hernia, heart burn, medication side effects,  .  Patient denies hx of cancer, stroke, seizures, lung problem, major cardiac events, diabetes, unexplained weight loss, changes in bowel or bladder problems, new onset stumbling or dropping things apart from described below.    Limitations House hold activities;Lifting;Standing;Walking;Other (comment)   Functional Limitations: difficulty with ADLs, IADLs, bed mobility, transfers, household and community mobility, stairs, feels debilitated. Putting on shoes/socks, lifting milk.   Diagnostic tests Bone density scan 11/12/2020: "INTERPRETATION:  Normal Bone Density. S/P  Left hip surgery, therefore   right is reported"    Patient Stated Goals States she wants to remain independent, does not want to depend on a walker. She knows she can get back to strength, flexibility, balance, that kind of thing. She is looknig for a professional that understands aging and a few things out of the norm that can help her recover some of this stuff. She can do some exercises herself but she needs some supporting hands to guide her  and suggest different exercises. 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    Pain Onset --           OBJECTIVE Patient is not present for examination at this time. Please see previous documentation  for latest objective data.     PT Short Term Goals - 01/03/21 8916      PT SHORT TERM GOAL #1   Title Be independent with initial home exercise program for self-management of symptoms.    Baseline Initial HEP provided at IE (12/24/2020);    Time 2    Period Weeks    Status Not Met    Target Date 01/07/21             PT Long Term Goals - 01/03/21 9450      PT LONG TERM GOAL #1   Title Be independent with a long-term home exercise program for self-management of symptoms    Baseline Initial HEP provided at IE (12/24/2020);    Time 12    Period Weeks    Status Not Met   TARGET DATE FOR ALL LONG TERM GOALS: 03/18/2021     PT LONG TERM GOAL #2   Title Demonstrate improved FOTO score to equal or greater than 52 by visit # 17 to demonstrate improvement in overall condition and self-reported functional ability.    Baseline 48 (12/24/2020);    Time 12    Period Weeks    Status Not Met      PT LONG TERM GOAL #3   Title Improve Abbreviated ABC scale to equal or greater than 50% to improve confidence in balance in daily activities and show improved fall risk.    Baseline Abbreviated ABC 26.7% (12/24/2020);    Time 12    Period Weeks    Status Not Met      PT LONG TERM GOAL #4   Title Patient will complete 5 Time Sit To Stand test in equal or less than 15 seconds with no UE support from 18.5 inch surface to demonstrate improved LE functional strength and ability to move around her home and the community safety.    Baseline 18 seconds from 18.5 inch plinth no UE support (12/24/2020);    Time 12    Period Weeks    Status Not Met      PT LONG TERM GOAL #5   Title Patient will ambulate at least 1000 feet on 6 Minute Walk Test to demonstrate improved household and community mobility.    Baseline to be tested visit 2 as appropriate (12/24/2020);    Time 12    Period Weeks    Status Not Met      PT LONG TERM GOAL #6   Title Complete community, work and/or recreational activities without  limitation due to current condition.    Baseline Functional Limitations: difficulty with ADLs, IADLs, bed mobility, transfers, household and community mobility, stairs, etc (12/24/2020);    Time 12    Period Weeks    Status Not Met                 Plan - 01/03/21 3888    Clinical Impression Statement Patient attended initial eval only and then requested discontinuation of PT at this time due to other medical issues related to reaction to medication. She is now being discharged from PT at this time for that reason.    Personal Factors and Comorbidities Age;Comorbidity 3+;Past/Current Experience;Fitness;Time since onset of injury/illness/exacerbation  Comorbidities Relevant past medical history and comorbidities include hx Hysterectomy 10/24/2020 (released from restrictions), L THA (2008 and 2010), R knee surgery, latent TB, OA, RA, Lupus, chronic pain syndrome, hiatal hernia, heart burn, medication side effects,    Examination-Activity Limitations Bathing;Stand;Lift;Bed Mobility;Locomotion Level;Toileting;Bend;Transfers;Carry;Sit;Squat;Dressing;Hygiene/Grooming;Stairs    Examination-Participation Restrictions Community Activity;Cleaning;Meal Prep;Yard Work;Interpersonal Relationship;Laundry;Shop    Stability/Clinical Decision Making Evolving/Moderate complexity    Rehab Potential Good    PT Frequency 2x / week    PT Duration 12 weeks    PT Treatment/Interventions ADLs/Self Care Home Management;Aquatic Therapy;Cryotherapy;Moist Heat;Electrical Stimulation;DME Instruction;Gait training;Stair training;Functional mobility training;Therapeutic activities;Balance training;Therapeutic exercise;Neuromuscular re-education;Patient/family education;Manual techniques;Passive range of motion;Dry needling;Spinal Manipulations;Joint Manipulations    PT Next Visit Plan Patient is now discharged from PT due to change in medical status preventing her from continuing    PT Home Exercise Plan verbal: 5-10  reps of sit <> Stand 3 times a day    Consulted and Agree with Plan of Care Patient           Patient will benefit from skilled therapeutic intervention in order to improve the following deficits and impairments:  Abnormal gait,Pain,Improper body mechanics,Cardiopulmonary status limiting activity,Decreased mobility,Decreased activity tolerance,Decreased endurance,Decreased range of motion,Decreased strength,Hypomobility,Impaired perceived functional ability,Impaired UE functional use,Decreased balance,Difficulty walking,Impaired flexibility,Obesity  Visit Diagnosis: Muscle weakness (generalized)  Pain in extremity, unspecified extremity  Other symptoms and signs involving the musculoskeletal system  Difficulty in walking, not elsewhere classified  Unsteadiness on feet     Problem List Patient Active Problem List   Diagnosis Date Noted  . Pain management contract signed 12/04/2020  . Rheumatoid arthritis involving multiple sites ([place]) 11/21/2020  . Polyarthralgia 11/21/2020  . History of left hip replacement 11/21/2020  . H/O right knee surgery 11/21/2020  . Chronic pain of both knees 11/21/2020  . Chronic pain of both hips 11/21/2020  . Chronic pain syndrome 11/21/2020  . 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 11/11/2020  . Postmenopausal bleeding 10/24/2020    Everlean Alstrom. Graylon Good, PT, DPT 01/03/21, 9:29 AM  [facility] [address] [place], [address] Phone: [phone]   Fax:  [phone]  Name: Candace Juarez MRN: [id number] Date of Birth: 1951-08-16

## 2021-01-07 ENCOUNTER — Encounter: Payer: Medicare Other | Admitting: Physical Therapy

## 2021-01-09 ENCOUNTER — Telehealth: Payer: Self-pay

## 2021-01-09 ENCOUNTER — Encounter: Payer: Medicare Other | Admitting: Physical Therapy

## 2021-01-09 ENCOUNTER — Other Ambulatory Visit: Payer: Self-pay | Admitting: Internal Medicine

## 2021-01-09 DIAGNOSIS — Z1231 Encounter for screening mammogram for malignant neoplasm of breast: Secondary | ICD-10-CM

## 2021-01-09 DIAGNOSIS — R7612 Nonspecific reaction to cell mediated immunity measurement of gamma interferon antigen response without active tuberculosis: Secondary | ICD-10-CM

## 2021-01-09 NOTE — Telephone Encounter (Signed)
TC with patient re: starting INH/B6. Patient reports still itching and was directed to take Benadryl several times a day by her PCP.  When patient has fully recovered from issues, she verbalized understanding to call TB RN to start new LTBI regimen Richmond Campbell, RN

## 2021-01-13 ENCOUNTER — Encounter: Payer: Medicare Other | Admitting: Physical Therapy

## 2021-01-15 ENCOUNTER — Encounter: Payer: Medicare Other | Admitting: Physical Therapy

## 2021-01-20 ENCOUNTER — Encounter: Payer: Medicare Other | Admitting: Physical Therapy

## 2021-01-22 ENCOUNTER — Encounter: Payer: Medicare Other | Admitting: Physical Therapy

## 2021-01-27 ENCOUNTER — Encounter: Payer: Medicare Other | Admitting: Physical Therapy

## 2021-01-28 ENCOUNTER — Other Ambulatory Visit: Payer: Self-pay

## 2021-01-28 ENCOUNTER — Ambulatory Visit
Payer: Medicare Other | Attending: Student in an Organized Health Care Education/Training Program | Admitting: Student in an Organized Health Care Education/Training Program

## 2021-01-28 ENCOUNTER — Encounter: Payer: Self-pay | Admitting: Student in an Organized Health Care Education/Training Program

## 2021-01-28 VITALS — BP 151/89 | HR 91 | Temp 97.4°F | Resp 16 | Ht 62.0 in | Wt 238.0 lb

## 2021-01-28 DIAGNOSIS — Z96642 Presence of left artificial hip joint: Secondary | ICD-10-CM | POA: Insufficient documentation

## 2021-01-28 DIAGNOSIS — M25561 Pain in right knee: Secondary | ICD-10-CM | POA: Diagnosis present

## 2021-01-28 DIAGNOSIS — M2550 Pain in unspecified joint: Secondary | ICD-10-CM | POA: Insufficient documentation

## 2021-01-28 DIAGNOSIS — Z0289 Encounter for other administrative examinations: Secondary | ICD-10-CM | POA: Insufficient documentation

## 2021-01-28 DIAGNOSIS — M25562 Pain in left knee: Secondary | ICD-10-CM | POA: Insufficient documentation

## 2021-01-28 DIAGNOSIS — M069 Rheumatoid arthritis, unspecified: Secondary | ICD-10-CM | POA: Diagnosis present

## 2021-01-28 DIAGNOSIS — Z9889 Other specified postprocedural states: Secondary | ICD-10-CM | POA: Diagnosis present

## 2021-01-28 DIAGNOSIS — G894 Chronic pain syndrome: Secondary | ICD-10-CM | POA: Insufficient documentation

## 2021-01-28 DIAGNOSIS — G8929 Other chronic pain: Secondary | ICD-10-CM | POA: Diagnosis present

## 2021-01-28 MED ORDER — HYDROCODONE-ACETAMINOPHEN 7.5-325 MG PO TABS: 1.0000 | ORAL_TABLET | Freq: Three times a day (TID) | ORAL | 0 refills | Status: DC | PRN

## 2021-01-28 MED ORDER — HYDROCODONE-ACETAMINOPHEN 7.5-325 MG PO TABS: 1.0000 | ORAL_TABLET | Freq: Three times a day (TID) | ORAL | 0 refills | Status: AC | PRN

## 2021-01-28 NOTE — Progress Notes (Signed)
PROVIDER NOTE: Information contained herein reflects review and annotations entered in association with encounter. Interpretation of such information and data should be left to medically-trained personnel. Information provided to patient can be located elsewhere in the medical record under "Patient Instructions". Document created using STT-dictation technology, any transcriptional errors that may result from process are unintentional.    Patient: Candace Juarez  Service Category: E/M  Provider: Gillis Santa, MD  DOB: 12/14/1950  DOS: 01/28/2021  Specialty: Interventional Pain Management  MRN: [id number]  Setting: Ambulatory outpatient  PCP: Gladstone Lighter, MD  Type: Established Patient    Referring Provider: Gladstone Lighter, MD  Location: Office  Delivery: Face-to-face     HPI  Candace Juarez, a 70 y.o. year old female, is here today because of her Rheumatoid arthritis involving multiple sites, unspecified whether rheumatoid factor present ([place]) [M06.9]. Ms. Amick primary complain today is Joint Pain, Shoulder Pain, and Back Pain (low) Last encounter: My last encounter with her was on 01/02/2021. Pertinent problems: Candace Juarez does not have any pertinent problems on file. Pain Assessment: Severity of Chronic pain is reported as a 5 /10. Location: Other (Comment) (joint pain, shoulders, low back)  ("moves all around")/ . Onset: More than a month ago. Quality: Sharp. Timing: Constant. Modifying factor(s): meds, heat, showers, stretching. Vitals:  height is _0  (1.575 m) and weight is 238 lb (108 kg). Her temporal temperature is 97.4 F (36.3 C) (abnormal). Her blood pressure is 151/89 (abnormal) and her pulse is 91. Her respiration is 16 and oxygen saturation is 99%.   Reason for encounter: medication management.    Patient states that she has had a rough last 2 months.  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  She did see dermatology for this.  She has received 2 steroid tapers from her primary care provider which she states did help out with her acute on chronic pain.  She presents today for refill of her hydrocodone for her chronic pain syndrome related to rheumatoid arthritis.  No change in dose, will refill as below.  Pharmacotherapy Assessment   Analgesic: Norco 7.5 mg 3 times daily as needed, quantity 90/month; MME equals 22.5     Monitoring: [place] PMP: PDMP reviewed during this encounter.       Pharmacotherapy: No side-effects or adverse reactions reported. Compliance: No problems identified. Effectiveness: Clinically acceptable.  Rise Patience, RN  01/28/2021  2:37 PM  Sign when Signing Visit Nursing Pain Medication Assessment:  Safety precautions to be maintained throughout the outpatient stay will include: orient to surroundings, keep bed in low position, maintain call bell within reach at all times, provide assistance with transfer out of bed and ambulation.  Medication Inspection Compliance: Pill count conducted under aseptic conditions, in front of the patient. Neither the pills nor the bottle was removed from the patient's sight at any time. Once count was completed pills were immediately returned to the patient in their original bottle.  Medication: Hydrocodone/APAP Pill/Patch Count: 30 of 90 pills remain Pill/Patch Appearance: Markings consistent with prescribed medication Bottle Appearance: Standard pharmacy container. Clearly labeled. Filled Date: 02 / 28 / 2022 Last Medication intake:  Today    UDS:  Summary  Date Value Ref Range Status  11/21/2020 Note  Final    Comment:    ====================================================================  Compliance Drug Analysis,  [place] ==================================================================== Test                             Result       Flag       Units  Drug Present and Declared for Prescription Verification   Alprazolam                     137          EXPECTED   ng/mg creat   Alpha-hydroxyalprazolam        123          EXPECTED   ng/mg creat    Source of alprazolam is a scheduled prescription medication. Alpha-    hydroxyalprazolam is an expected metabolite of alprazolam.    Hydrocodone                    1206         EXPECTED   ng/mg creat   Hydromorphone                  540          EXPECTED   ng/mg creat   Dihydrocodeine                 246          EXPECTED   ng/mg creat   Norhydrocodone                 1460         EXPECTED   ng/mg creat    Sources of hydrocodone include scheduled prescription medications.    Hydromorphone, dihydrocodeine and norhydrocodone are expected    metabolites of hydrocodone. Hydromorphone and dihydrocodeine are    also available as scheduled prescription medications.    Venlafaxine                    PRESENT      EXPECTED   Desmethylvenlafaxine           PRESENT      EXPECTED    Desmethylvenlafaxine is an expected metabolite of venlafaxine.    Acetaminophen                  PRESENT      EXPECTED  Drug Present not Declared for Prescription Verification   Gabapentin                     PRESENT      UNEXPECTED  Drug Absent but Declared for Prescription Verification   Diphenhydramine                Not Detected UNEXPECTED ==================================================================== Test                      Result    Flag   Units      Ref Range   Creatinine              35               mg/dL      >=20 ==================================================================== Declared Medications:  The flagging and interpretation on this report are based on the  following declared medications.  Unexpected results may arise from  inaccuracies in the declared  medications.   **Note: The testing scope of this panel includes these medications:   Alprazolam  Diphenhydramine (  Sominex)  Hydrocodone  Venlafaxine   **Note: The testing scope of this panel does not include small to  moderate amounts of these reported medications:   Acetaminophen   **Note: The testing scope of this panel does not include the  following reported medications:   Abatacept  Aluminum Hydroxide (Maalox)  Atropine (Lomotil)  Cetirizine (Zyrtec)  Diphenoxylate (Lomotil)  Docusate (Colace)  Estradiol (Estrace)  Eye Drop  Fluticasone  Hydroxychloroquine  Levothyroxine  Magnesium Hydroxide (Maalox)  Nystatin  Polyethylene Glycol  Solifenacin  Vitamin D2 ==================================================================== For clinical consultation, please call [phone]. ====================================================================      ROS  Constitutional: Denies any fever or chills Gastrointestinal: No reported hemesis, hematochezia, vomiting, or acute GI distress Musculoskeletal: Diffuse polyarthralgias, polymyalgias Neurological: No reported episodes of acute onset apraxia, aphasia, dysarthria, agnosia, amnesia, paralysis, loss of coordination, or loss of consciousness  Medication Review  ALPRAZolam, Abatacept, HYDROcodone-acetaminophen, Polyethyl Glycol-Propyl Glycol, aluminum-magnesium hydroxide-simethicone, cetirizine, cycloSPORINE, diphenhydrAMINE, diphenoxylate-atropine, doxepin, estradiol, fluticasone, hydroxychloroquine, levothyroxine, nystatin, solifenacin, and venlafaxine XR  History Review  Allergy: Ms. Molenda is allergic to rifampin, sulfa antibiotics, and nsaids. Drug: Ms. Parrilla  reports no history of drug use. Alcohol:  reports no history of alcohol use. Tobacco:  reports that she has never smoked. She has never used smokeless tobacco. Social: Ms. Dafoe  reports that she has never smoked. She has never used smokeless tobacco. She  reports that she does not drink alcohol and does not use drugs. Medical:  has a past medical history of Arthritis, History of hiatal hernia, Hypothyroidism, Lupus ([place]), Rheumatoid arthritis ([place]), and Rosacea. Surgical: Ms. Shurtz  has a past surgical history that includes Joint replacement (Left); Cholesteatoma excision (Right); vericose; Colonoscopy; Breast enhancement surgery; Cholecystectomy; Hernia repair; Tubal ligation; Total laparoscopic hysterectomy with bilateral salpingo oophorectomy (Bilateral, 10/24/2020); and Cystocele repair (N/A, 10/24/2020). Family: family history is not on file.  Laboratory Chemistry Profile   Renal Lab Results  Component Value Date   BUN 16 10/25/2020   CREATININE 0.87 10/25/2020   GFRNONAA >60 10/25/2020     Hepatic Lab Results  Component Value Date   AST 31 12/17/2020   ALT 22 12/17/2020   ALBUMIN 4.2 12/17/2020   ALKPHOS 71 12/17/2020   LIPASE 28 09/06/2020     Electrolytes Lab Results  Component Value Date   NA 136 10/25/2020   K 4.2 10/25/2020   CL 100 10/25/2020   CALCIUM 8.3 (L) 10/25/2020     Bone No results found for: VD25OH, VD125OH2TOT, QA8341DQ2, IW9798XQ1, 25OHVITD1, 25OHVITD2, 25OHVITD3, TESTOFREE, TESTOSTERONE   Inflammation (CRP: Acute Phase) (ESR: Chronic Phase) No results found for: CRP, ESRSEDRATE, LATICACIDVEN     Note: Above Lab results reviewed.  Recent Imaging Review  US Pelvis Complete CLINICAL DATA:  70 year old female with vaginal bleeding.  EXAM: TRANSABDOMINAL ULTRASOUND OF PELVIS  TECHNIQUE: Transabdominal ultrasound examination of the pelvis was performed including evaluation of the uterus, ovaries, adnexal regions, and pelvic cul-de-sac.  COMPARISON:  CT abdomen pelvis dated 09/06/2020.  FINDINGS: Uterus  Measurements: 8.0 x 2.7 x 4.7 cm = volume: 53 mL. The uterus is unremarkable as visualized. There is a 10.6 x 4.5 x 6.0 cm hypoechoic collection with no internal vascularity in  the endocervical region or vagina corresponding to the collection seen on the prior CT. Evaluation is limited due to suboptimal visualization as the patient refused transvaginal imaging.  This collection likely represents hydrocolpos or hematocolpos and concerning for outflow obstruction.  There is an apparent 4 by 3 x 4 cm echogenic structure  along the posterior wall of the endocervical region or posterior wall of the upper vagina which is poorly visualized. There is associated mass effect and narrowing of the vaginal canal by this mass. Gynecology consult and direct visualization recommended.  Endometrium  Thickness: 3 mm.  No focal abnormality visualized.  Right ovary  Measurements: 3.7 x 2.2 x 2.7 cm = volume: 11 mL. Normal appearance/no adnexal mass.  Left ovary  Measurements: 3.5 x 3.0 x 3.2 cm = volume: 18 mL. There is a 2.4 x 2.4 x 1.9 cm cyst in the left ovary.  Other findings:  No abnormal free fluid.  IMPRESSION: Hypoechoic collection in the endocervical region or upper vagina with findings concerning for a degree of outflow obstruction. Apparent ill-defined structure along the posterior wall of the vagina or cervix with associated mass effect. Gynecology consult and direct visualization recommended.  Electronically Signed   By: Anner Crete M.D.   On: 09/06/2020 19:14 CT ABDOMEN PELVIS W CONTRAST CLINICAL DATA:  70 year old female with abdominal pain. Vaginal bleeding.  EXAM: CT ABDOMEN AND PELVIS WITH CONTRAST  TECHNIQUE: Multidetector CT imaging of the abdomen and pelvis was performed using the standard protocol following bolus administration of intravenous contrast.  CONTRAST:  151m OMNIPAQUE IOHEXOL 300 MG/ML  SOLN  COMPARISON:  None.  FINDINGS: Lower chest: The visualized lung bases are clear.  No intra-abdominal free air or free fluid.  Hepatobiliary: The liver is unremarkable. No intrahepatic biliary ductal dilatation.  Cholecystectomy. No retained calcified stone noted in the central CBD.  Pancreas: Unremarkable. No pancreatic ductal dilatation or surrounding inflammatory changes.  Spleen: Normal in size without focal abnormality.  Adrenals/Urinary Tract: The adrenal glands are unremarkable. There is no hydronephrosis on either side. There is symmetric enhancement and excretion of contrast by both kidneys. The urinary bladder is unremarkable.  Stomach/Bowel: There is moderate stool throughout the colon. There is sigmoid diverticulosis without active inflammatory changes. There is no bowel obstruction or active inflammation. The appendix is normal.  Vascular/Lymphatic: Mild aortoiliac atherosclerotic disease. The IVC is unremarkable. No portal venous gas. There is no adenopathy.  Reproductive: The uterus is anteverted. There is a 4 x 6 cm low attenuating collection in the lower uterus or within the vagina. This is suboptimally visualized due to streak artifact caused by left hip arthroplasty. This likely represents a hydro or hematometra or hydrocolpos. Pelvic ultrasound may provide better evaluation. There is a 2.5 cm left ovarian cyst. Further evaluation with pelvic ultrasound recommended.  Other: Ventral hernia repair mesh.  Musculoskeletal: Degenerative changes of the spine. No acute osseous pathology. Total left hip arthroplasty.  IMPRESSION: 1. Poorly visualized fluid collection in the lower uterus or in the vagina, likely a hydro or hematocolpos. Correlation with clinical exam and further evaluation with pelvic ultrasound recommended. 2. A 2.5 cm left ovarian cyst. 3. Sigmoid diverticulosis. No bowel obstruction. Normal appendix. 4. Aortic Atherosclerosis (ICD10-I70.0).  Electronically Signed   By: AAnner CreteM.D.   On: 09/06/2020 17:30 Note: Reviewed        Physical Exam  General appearance: Well nourished, well developed, and well hydrated. In no apparent acute  distress Mental status: Alert, oriented x 3 (person, place, & time)       Respiratory: No evidence of acute respiratory distress Eyes: PERLA Vitals: BP (!) 151/89   Pulse 91   Temp (!) 97.4 F (36.3 C) (Temporal)   Resp 16   Ht _0  (1.575 m)   Wt 238 lb (108 kg)  SpO2 99%   BMI 43.53 kg/m  BMI: Estimated body mass index is 43.53 kg/m as calculated from the following:   Height as of this encounter: _0  (1.575 m).   Weight as of this encounter: 238 lb (108 kg). Ideal: Ideal body weight: 50.1 kg (110 lb 7.2 oz) Adjusted ideal body weight: 73.2 kg (161 lb 7.5 oz)  Lumbar Exam  Skin & Axial Inspection:No masses, redness, or swelling Alignment:Symmetrical Functional FYB:OFBPZWCHENID ROM Stability:No instability detected Muscle Tone/Strength:Functionally intact. No obvious neuro-muscular anomalies detected. Sensory (Neurological):Musculoskeletal pain pattern   Gait & Posture Assessment  Ambulation:Unassisted Gait:Relatively normal for age and body habitus Posture:WNL  Lower Extremity Exam    Side:Right lower extremity  Side:Left lower extremity  Stability:No instability observed  Stability:No instability observed  Skin & Extremity Inspection:Skin color, temperature, and hair growth are WNL. No peripheral edema or cyanosis. No masses, redness, swelling, asymmetry, or associated skin lesions. No contractures.  Skin & Extremity Inspection:Evidence of prior arthroplastic surgery  Functional POE:UMPN restricted ROMfor hip and knee joints   Functional TIR:[id number] ROM   Muscle Tone/Strength:Functionally intact. No obvious neuro-muscular anomalies detected.  Muscle Tone/Strength:Functionally intact. No obvious neuro-muscular anomalies detected.  Sensory (Neurological):Unimpaired  Sensory (Neurological):Arthropathic arthralgiaknee  DTR: Patellar:deferred today Achilles:deferred  today Plantar:deferred today  DTR: Patellar:deferred today Achilles:deferred today Plantar:deferred today  Palpation:No palpable anomalies  Palpation:No palpable anomalies     Assessment   Status Diagnosis  Controlled Controlled Controlled 1. Rheumatoid arthritis involving multiple sites, unspecified whether rheumatoid factor present ([place])   2. Polyarthralgia   3. History of left hip replacement   4. H/O right knee surgery   5. Pain management contract signed   6. Chronic pain of both knees   7. Chronic pain syndrome       Plan of Care   Ms. Mariadejesus Cade has a current medication list which includes the following long-term medication(s): abatacept, cetirizine, diphenhydramine, estradiol, fluticasone, levothyroxine, venlafaxine xr, and doxepin.  Pharmacotherapy (Medications Ordered): Meds ordered this encounter  Medications  . HYDROcodone-acetaminophen (NORCO) 7.5-325 MG tablet    Sig: Take 1 tablet by mouth 3 (three) times daily as needed for severe pain. Must last 30 days.    Dispense:  90 tablet    Refill:  0    Chronic Pain. (STOP Act - Not applicable). Fill one day early if closed on scheduled refill date.  Marland Kitchen HYDROcodone-acetaminophen (NORCO) 7.5-325 MG tablet    Sig: Take 1 tablet by mouth 3 (three) times daily as needed for severe pain. Must last 30 days.    Dispense:  90 tablet    Refill:  0    Chronic Pain. (STOP Act - Not applicable). Fill one day early if closed on scheduled refill date.   Follow-up plan:   Return in about 2 months (around 04/03/2021) for Medication Management, in person.   Recent Visits Date Type Provider Dept  12/04/20 Office Visit Gillis Santa, MD [facility]  11/21/20 Office Visit Gillis Santa, MD [facility]  Showing recent visits within past 90 days and meeting all other requirements Today's Visits Date Type Provider Dept  01/28/21 Office Visit Gillis Santa, MD [facility]  Showing today's  visits and meeting all other requirements Future Appointments Date Type Provider Dept  03/25/21 Appointment Gillis Santa, MD [facility]  Showing future appointments within next 90 days and meeting all other requirements  I discussed the assessment and treatment plan with the patient. The patient was provided an opportunity to ask  questions and all were answered. The patient agreed with the plan and demonstrated an understanding of the instructions.  Patient advised to call back or seek an in-person evaluation if the symptoms or condition worsens.  Duration of encounter: 9mnutes.  Note by: BGillis Santa MD Date: 01/28/2021; Time: 3:43 PM

## 2021-01-28 NOTE — Progress Notes (Signed)
Nursing Pain Medication Assessment:  Safety precautions to be maintained throughout the outpatient stay will include: orient to surroundings, keep bed in low position, maintain call bell within reach at all times, provide assistance with transfer out of bed and ambulation.  Medication Inspection Compliance: Pill count conducted under aseptic conditions, in front of the patient. Neither the pills nor the bottle was removed from the patient's sight at any time. Once count was completed pills were immediately returned to the patient in their original bottle.  Medication: Hydrocodone/APAP Pill/Patch Count: 30 of 90 pills remain Pill/Patch Appearance: Markings consistent with prescribed medication Bottle Appearance: Standard pharmacy container. Clearly labeled. Filled Date: 02 / 28 / 2022 Last Medication intake:  Today

## 2021-01-30 ENCOUNTER — Encounter: Payer: Medicare Other | Admitting: Physical Therapy

## 2021-02-03 ENCOUNTER — Encounter: Payer: Medicare Other | Admitting: Physical Therapy

## 2021-02-06 ENCOUNTER — Encounter: Payer: Medicare Other | Admitting: Physical Therapy

## 2021-03-25 ENCOUNTER — Encounter: Payer: Self-pay | Admitting: Student in an Organized Health Care Education/Training Program

## 2021-03-25 ENCOUNTER — Ambulatory Visit
Payer: Medicare Other | Attending: Student in an Organized Health Care Education/Training Program | Admitting: Student in an Organized Health Care Education/Training Program

## 2021-03-25 ENCOUNTER — Other Ambulatory Visit: Payer: Self-pay

## 2021-03-25 VITALS — BP 158/83 | HR 120 | Temp 96.9°F | Resp 18 | Ht 62.0 in | Wt 240.0 lb

## 2021-03-25 DIAGNOSIS — M069 Rheumatoid arthritis, unspecified: Secondary | ICD-10-CM | POA: Insufficient documentation

## 2021-03-25 DIAGNOSIS — M25511 Pain in right shoulder: Secondary | ICD-10-CM | POA: Insufficient documentation

## 2021-03-25 DIAGNOSIS — G894 Chronic pain syndrome: Secondary | ICD-10-CM

## 2021-03-25 DIAGNOSIS — Z96642 Presence of left artificial hip joint: Secondary | ICD-10-CM

## 2021-03-25 DIAGNOSIS — M19011 Primary osteoarthritis, right shoulder: Secondary | ICD-10-CM | POA: Diagnosis present

## 2021-03-25 DIAGNOSIS — M19012 Primary osteoarthritis, left shoulder: Secondary | ICD-10-CM | POA: Diagnosis present

## 2021-03-25 DIAGNOSIS — M25561 Pain in right knee: Secondary | ICD-10-CM | POA: Insufficient documentation

## 2021-03-25 DIAGNOSIS — Z9889 Other specified postprocedural states: Secondary | ICD-10-CM | POA: Insufficient documentation

## 2021-03-25 DIAGNOSIS — Z0289 Encounter for other administrative examinations: Secondary | ICD-10-CM | POA: Diagnosis present

## 2021-03-25 DIAGNOSIS — G8929 Other chronic pain: Secondary | ICD-10-CM | POA: Insufficient documentation

## 2021-03-25 DIAGNOSIS — M25512 Pain in left shoulder: Secondary | ICD-10-CM | POA: Diagnosis present

## 2021-03-25 DIAGNOSIS — M2550 Pain in unspecified joint: Secondary | ICD-10-CM | POA: Diagnosis present

## 2021-03-25 DIAGNOSIS — M25562 Pain in left knee: Secondary | ICD-10-CM | POA: Insufficient documentation

## 2021-03-25 MED ORDER — HYDROCODONE-ACETAMINOPHEN 7.5-325 MG PO TABS: 1.0000 | ORAL_TABLET | Freq: Three times a day (TID) | ORAL | 0 refills | Status: AC | PRN

## 2021-03-25 MED ORDER — HYDROCODONE-ACETAMINOPHEN 7.5-325 MG PO TABS: 1.0000 | ORAL_TABLET | Freq: Three times a day (TID) | ORAL | 0 refills | Status: DC | PRN

## 2021-03-25 NOTE — Progress Notes (Signed)
Nursing Pain Medication Assessment:  Safety precautions to be maintained throughout the outpatient stay will include: orient to surroundings, keep bed in low position, maintain call bell within reach at all times, provide assistance with transfer out of bed and ambulation.  Medication Inspection Compliance: Ms. Puebla did not comply with our request to bring her pills to be counted. She was reminded that bringing the medication bottles, even when empty, is a requirement.  Medication: None brought in. Pill/Patch Count: None available to be counted. Bottle Appearance: No container available. Did not bring bottle(s) to appointment. Filled Date: N/A Last Medication intake:  Today  Reminded to bring pills/bottles for count

## 2021-03-25 NOTE — Patient Instructions (Signed)

## 2021-03-25 NOTE — Progress Notes (Signed)
PROVIDER NOTE: Information contained herein reflects review and annotations entered in association with encounter. Interpretation of such information and data should be left to medically-trained personnel. Information provided to patient can be located elsewhere in the medical record under "Patient Instructions". Document created using STT-dictation technology, any transcriptional errors that may result from process are unintentional.    Patient: Candace Juarez  Service Category: E/M  Provider: Gillis Santa, MD  DOB: 11/11/1950  DOS: 03/25/2021  Specialty: Interventional Pain Management  MRN: [id number]  Setting: Ambulatory outpatient  PCP: Gladstone Lighter, MD  Type: Established Patient    Referring Provider: Gladstone Lighter, MD  Location: Office  Delivery: Face-to-face     HPI  Candace Juarez, a 70 y.o. year old female, is here today because of her Rheumatoid arthritis involving multiple sites, unspecified whether rheumatoid factor present ([place]) [M06.9]. [name] primary complain today is Shoulder Pain (bilateral), Hip Pain, and Knee Pain Last encounter: My last encounter with her was on 01/28/2021. Pertinent problems: [name] has Polyarthralgia; History of left hip replacement; H/O right knee surgery; Chronic pain of both knees; Chronic pain of both hips; Chronic pain syndrome; and Pain management contract signed on their pertinent problem list. Pain Assessment: Severity of Chronic pain is reported as a 6 /10. Location: Shoulder Right,Left/radiates down to elbow. Onset: More than a month ago. Quality: Burning,Aching,Discomfort. Timing: Constant. Modifying factor(s): not moving. Vitals:  height is _0  (1.575 m) and weight is 240 lb (108.9 kg). Her temperature is 96.9 F (36.1 C) (abnormal). Her blood pressure is 158/83 (abnormal) and her pulse is 120 (abnormal). Her respiration is 18 and oxygen saturation is 100%.   Reason for encounter: medication management.    Having increase bilateral  shoulder pain, radiating from shoulder to bilateral elbows.  Requesting shoulder steroid injection that she has had done before in [place]. Has Orencia scheduled for tomorrow Presents today for refill of Hydrocodone, no change in dose   Pharmacotherapy Assessment   Analgesic: Norco 7.5 mg 3 times daily as needed, quantity 90/month; MME equals 22.5     Monitoring: [place] PMP: PDMP reviewed during this encounter.       Pharmacotherapy: No side-effects or adverse reactions reported. Compliance: No problems identified. Effectiveness: Clinically acceptable.  Dewayne Shorter, RN  03/25/2021  3:14 PM  Sign when Signing Visit Nursing Pain Medication Assessment:  Safety precautions to be maintained throughout the outpatient stay will include: orient to surroundings, keep bed in low position, maintain call bell within reach at all times, provide assistance with transfer out of bed and ambulation.  Medication Inspection Compliance: [name] did not comply with our request to bring her pills to be counted. She was reminded that bringing the medication bottles, even when empty, is a requirement.  Medication: None brought in. Pill/Patch Count: None available to be counted. Bottle Appearance: No container available. Did not bring bottle(s) to appointment. Filled Date: N/A Last Medication intake:  Today  Reminded to bring pills/bottles for count    UDS:  Summary  Date Value Ref Range Status  11/21/2020 Note  Final    Comment:    ==================================================================== Compliance Drug Analysis, Ur ==================================================================== Test                             Result       Flag       Units  Drug Present and Declared for Prescription Verification   Alprazolam  137          EXPECTED   ng/mg creat   Alpha-hydroxyalprazolam        123          EXPECTED   ng/mg creat    Source of alprazolam is a scheduled prescription  medication. Alpha-    hydroxyalprazolam is an expected metabolite of alprazolam.    Hydrocodone                    1206         EXPECTED   ng/mg creat   Hydromorphone                  540          EXPECTED   ng/mg creat   Dihydrocodeine                 246          EXPECTED   ng/mg creat   Norhydrocodone                 1460         EXPECTED   ng/mg creat    Sources of hydrocodone include scheduled prescription medications.    Hydromorphone, dihydrocodeine and norhydrocodone are expected    metabolites of hydrocodone. Hydromorphone and dihydrocodeine are    also available as scheduled prescription medications.    Venlafaxine                    PRESENT      EXPECTED   Desmethylvenlafaxine           PRESENT      EXPECTED    Desmethylvenlafaxine is an expected metabolite of venlafaxine.    Acetaminophen                  PRESENT      EXPECTED  Drug Present not Declared for Prescription Verification   Gabapentin                     PRESENT      UNEXPECTED  Drug Absent but Declared for Prescription Verification   Diphenhydramine                Not Detected UNEXPECTED ==================================================================== Test                      Result    Flag   Units      Ref Range   Creatinine              35               mg/dL      >=20 ==================================================================== Declared Medications:  The flagging and interpretation on this report are based on the  following declared medications.  Unexpected results may arise from  inaccuracies in the declared medications.   **Note: The testing scope of this panel includes these medications:   Alprazolam  Diphenhydramine (Sominex)  Hydrocodone  Venlafaxine   **Note: The testing scope of this panel does not include small to  moderate amounts of these reported medications:   Acetaminophen   **Note: The testing scope of this panel does not include the  following reported  medications:   Abatacept  Aluminum Hydroxide (Maalox)  Atropine (Lomotil)  Cetirizine (Zyrtec)  Diphenoxylate (Lomotil)  Docusate (Colace)  Estradiol (Estrace)  Eye Drop  Fluticasone  Hydroxychloroquine  Levothyroxine  Magnesium Hydroxide (  Maalox)  Nystatin  Polyethylene Glycol  Solifenacin  Vitamin D2 ==================================================================== For clinical consultation, please call [phone]. ====================================================================      ROS  Constitutional: Denies any fever or chills Gastrointestinal: No reported hemesis, hematochezia, vomiting, or acute GI distress Musculoskeletal: bilateral shoulder and arm pain Neurological: No reported episodes of acute onset apraxia, aphasia, dysarthria, agnosia, amnesia, paralysis, loss of coordination, or loss of consciousness  Medication Review  ALPRAZolam, Abatacept, HYDROcodone-acetaminophen, Polyethyl Glycol-Propyl Glycol, aluminum-magnesium hydroxide-simethicone, cetirizine, cycloSPORINE, diphenhydrAMINE, diphenoxylate-atropine, doxepin, estradiol, fluticasone, hydroxychloroquine, levothyroxine, nystatin, solifenacin, and venlafaxine XR  History Review  Allergy: Ms. Kyllonen is allergic to rifampin, sulfa antibiotics, nsaids, and sulfur. Drug: Ms. Captain  reports no history of drug use. Alcohol:  reports no history of alcohol use. Tobacco:  reports that she has never smoked. She has never used smokeless tobacco. Social: Ms. Blakley  reports that she has never smoked. She has never used smokeless tobacco. She reports that she does not drink alcohol and does not use drugs. Medical:  has a past medical history of Arthritis, History of hiatal hernia, Hypothyroidism, Lupus ([place]), Rheumatoid arthritis ([place]), and Rosacea. Surgical: Ms. Aul  has a past surgical history that includes Joint replacement (Left); Cholesteatoma excision (Right); vericose; Colonoscopy; Breast enhancement  surgery; Cholecystectomy; Hernia repair; Tubal ligation; Total laparoscopic hysterectomy with bilateral salpingo oophorectomy (Bilateral, 10/24/2020); and Cystocele repair (N/A, 10/24/2020). Family: family history is not on file.  Laboratory Chemistry Profile   Renal Lab Results  Component Value Date   BUN 16 10/25/2020   CREATININE 0.87 10/25/2020   GFRNONAA >60 10/25/2020     Hepatic Lab Results  Component Value Date   AST 31 12/17/2020   ALT 22 12/17/2020   ALBUMIN 4.2 12/17/2020   ALKPHOS 71 12/17/2020   LIPASE 28 09/06/2020     Electrolytes Lab Results  Component Value Date   NA 136 10/25/2020   K 4.2 10/25/2020   CL 100 10/25/2020   CALCIUM 8.3 (L) 10/25/2020     Bone No results found for: VD25OH, VD125OH2TOT, SA6301SW1, UX3235TD3, 25OHVITD1, 25OHVITD2, 25OHVITD3, TESTOFREE, TESTOSTERONE   Inflammation (CRP: Acute Phase) (ESR: Chronic Phase) No results found for: CRP, ESRSEDRATE, LATICACIDVEN     Note: Above Lab results reviewed.  Recent Imaging Review  US Pelvis Complete CLINICAL DATA:  70 year old female with vaginal bleeding.  EXAM: TRANSABDOMINAL ULTRASOUND OF PELVIS  TECHNIQUE: Transabdominal ultrasound examination of the pelvis was performed including evaluation of the uterus, ovaries, adnexal regions, and pelvic cul-de-sac.  COMPARISON:  CT abdomen pelvis dated 09/06/2020.  FINDINGS: Uterus  Measurements: 8.0 x 2.7 x 4.7 cm = volume: 53 mL. The uterus is unremarkable as visualized. There is a 10.6 x 4.5 x 6.0 cm hypoechoic collection with no internal vascularity in the endocervical region or vagina corresponding to the collection seen on the prior CT. Evaluation is limited due to suboptimal visualization as the patient refused transvaginal imaging.  This collection likely represents hydrocolpos or hematocolpos and concerning for outflow obstruction.  There is an apparent 4 by 3 x 4 cm echogenic structure along the posterior wall of the  endocervical region or posterior wall of the upper vagina which is poorly visualized. There is associated mass effect and narrowing of the vaginal canal by this mass. Gynecology consult and direct visualization recommended.  Endometrium  Thickness: 3 mm.  No focal abnormality visualized.  Right ovary  Measurements: 3.7 x 2.2 x 2.7 cm = volume: 11 mL. Normal appearance/no adnexal mass.  Left ovary  Measurements: 3.5 x 3.0  x 3.2 cm = volume: 18 mL. There is a 2.4 x 2.4 x 1.9 cm cyst in the left ovary.  Other findings:  No abnormal free fluid.  IMPRESSION: Hypoechoic collection in the endocervical region or upper vagina with findings concerning for a degree of outflow obstruction. Apparent ill-defined structure along the posterior wall of the vagina or cervix with associated mass effect. Gynecology consult and direct visualization recommended.  Electronically Signed   By: Anner Crete M.D.   On: 09/06/2020 19:14 CT ABDOMEN PELVIS W CONTRAST CLINICAL DATA:  70 year old female with abdominal pain. Vaginal bleeding.  EXAM: CT ABDOMEN AND PELVIS WITH CONTRAST  TECHNIQUE: Multidetector CT imaging of the abdomen and pelvis was performed using the standard protocol following bolus administration of intravenous contrast.  CONTRAST:  185m OMNIPAQUE IOHEXOL 300 MG/ML  SOLN  COMPARISON:  None.  FINDINGS: Lower chest: The visualized lung bases are clear.  No intra-abdominal free air or free fluid.  Hepatobiliary: The liver is unremarkable. No intrahepatic biliary ductal dilatation. Cholecystectomy. No retained calcified stone noted in the central CBD.  Pancreas: Unremarkable. No pancreatic ductal dilatation or surrounding inflammatory changes.  Spleen: Normal in size without focal abnormality.  Adrenals/Urinary Tract: The adrenal glands are unremarkable. There is no hydronephrosis on either side. There is symmetric enhancement and excretion of contrast by both  kidneys. The urinary bladder is unremarkable.  Stomach/Bowel: There is moderate stool throughout the colon. There is sigmoid diverticulosis without active inflammatory changes. There is no bowel obstruction or active inflammation. The appendix is normal.  Vascular/Lymphatic: Mild aortoiliac atherosclerotic disease. The IVC is unremarkable. No portal venous gas. There is no adenopathy.  Reproductive: The uterus is anteverted. There is a 4 x 6 cm low attenuating collection in the lower uterus or within the vagina. This is suboptimally visualized due to streak artifact caused by left hip arthroplasty. This likely represents a hydro or hematometra or hydrocolpos. Pelvic ultrasound may provide better evaluation. There is a 2.5 cm left ovarian cyst. Further evaluation with pelvic ultrasound recommended.  Other: Ventral hernia repair mesh.  Musculoskeletal: Degenerative changes of the spine. No acute osseous pathology. Total left hip arthroplasty.  IMPRESSION: 1. Poorly visualized fluid collection in the lower uterus or in the vagina, likely a hydro or hematocolpos. Correlation with clinical exam and further evaluation with pelvic ultrasound recommended. 2. A 2.5 cm left ovarian cyst. 3. Sigmoid diverticulosis. No bowel obstruction. Normal appendix. 4. Aortic Atherosclerosis (ICD10-I70.0).  Electronically Signed   By: AAnner CreteM.D.   On: 09/06/2020 17:30 Note: Reviewed        Physical Exam  General appearance: Well nourished, well developed, and well hydrated. In no apparent acute distress Mental status: Alert, oriented x 3 (person, place, & time)       Respiratory: No evidence of acute respiratory distress Eyes: PERLA Vitals: BP (!) 158/83   Pulse (!) 120   Temp (!) 96.9 F (36.1 C)   Resp 18   Ht _0  (1.575 m)   Wt 240 lb (108.9 kg)   SpO2 100%   BMI 43.90 kg/m  BMI: Estimated body mass index is 43.9 kg/m as calculated from the following:   Height as of this  encounter: _1  (1.575 m).   Weight as of this encounter: 240 lb (108.9 kg). Ideal: Ideal body weight: 50.1 kg (110 lb 7.2 oz) Adjusted ideal body weight: 73.6 kg (162 lb 4.3 oz)   Cervical Spine Area Exam  Skin & Axial Inspection: No masses, redness,  edema, swelling, or associated skin lesions Alignment: Symmetrical Functional ROM: Pain restricted ROM, bilaterally Stability: No instability detected Muscle Tone/Strength: Functionally intact. No obvious neuro-muscular anomalies detected. Sensory (Neurological): Musculoskeletal pain pattern Palpation: No palpable anomalies             Upper Extremity (UE) Exam    Side: Right upper extremity  Side: Left upper extremity  Skin & Extremity Inspection: Skin color, temperature, and hair growth are WNL. No peripheral edema or cyanosis. No masses, redness, swelling, asymmetry, or associated skin lesions. No contractures.  Skin & Extremity Inspection: Skin color, temperature, and hair growth are WNL. No peripheral edema or cyanosis. No masses, redness, swelling, asymmetry, or associated skin lesions. No contractures.  Functional ROM: Pain restricted ROM for shoulder and elbow  Functional ROM: Pain restricted ROM for shoulder and elbow  Muscle Tone/Strength: Functionally intact. No obvious neuro-muscular anomalies detected.  Muscle Tone/Strength: Functionally intact. No obvious neuro-muscular anomalies detected.  Sensory (Neurological): Arthropathic arthralgia          Sensory (Neurological): Arthropathic arthralgia          Palpation: No palpable anomalies              Palpation: No palpable anomalies              Provocative Test(s):  Phalen's test: deferred Tinel's test: deferred Apley's scratch test (touch opposite shoulder):  Action 1 (Across chest): Decreased ROM Action 2 (Overhead): Decreased ROM Action 3 (LB reach): Decreased ROM   Provocative Test(s):  Phalen's test: deferred Tinel's test: deferred Apley's scratch test (touch opposite  shoulder):  Action 1 (Across chest): Decreased ROM Action 2 (Overhead): Decreased ROM Action 3 (LB reach): Decreased ROM    Lumbar Exam  Skin & Axial Inspection:No masses, redness, or swelling Alignment:Symmetrical Functional WLN:[id number] ROM Stability:No instability detected Muscle Tone/Strength:Functionally intact. No obvious neuro-muscular anomalies detected. Sensory (Neurological):Musculoskeletal pain pattern   Gait & Posture Assessment  Ambulation:Unassisted Gait:Relatively normal for age and body habitus Posture:WNL  Lower Extremity Exam    Side:Right lower extremity  Side:Left lower extremity  Stability:No instability observed  Stability:No instability observed  Skin & Extremity Inspection:Skin color, temperature, and hair growth are WNL. No peripheral edema or cyanosis. No masses, redness, swelling, asymmetry, or associated skin lesions. No contractures.  Skin & Extremity Inspection:Evidence of prior arthroplastic surgery  Functional CXK:GYJE restricted ROMfor hip and knee joints   Functional HUD:[id number] ROM   Muscle Tone/Strength:Functionally intact. No obvious neuro-muscular anomalies detected.  Muscle Tone/Strength:Functionally intact. No obvious neuro-muscular anomalies detected.  Sensory (Neurological):Unimpaired  Sensory (Neurological):Arthropathic arthralgiaknee  DTR: Patellar:deferred today Achilles:deferred today Plantar:deferred today  DTR: Patellar:deferred today Achilles:deferred today Plantar:deferred today  Palpation:No palpable anomalies  Palpation:No palpable anomalies    Assessment   Status Diagnosis  Controlled Having a Flare-up Having a Flare-up 1. Rheumatoid arthritis involving multiple sites, unspecified whether rheumatoid factor present ([place])   2. Primary osteoarthritis of both shoulders   3. Chronic pain of both shoulders   4.  Polyarthralgia   5. History of left hip replacement   6. H/O right knee surgery   7. Pain management contract signed   8. Chronic pain of both knees   9. Chronic pain syndrome      Updated Problems: Problem  Pain Management Contract Signed  Polyarthralgia  History of Left Hip Replacement  H/O Right Knee Surgery  Chronic Pain of Both Knees  Chronic Pain of Both Hips  Chronic Pain Syndrome  Primary Osteoarthritis of Both Shoulders  Chronic Pain  of Both Shoulders    Plan of Care   Ms. Keniah Klemmer has a current medication list which includes the following long-term medication(s): abatacept, cetirizine, diphenhydramine, doxepin, estradiol, fluticasone, levothyroxine, and venlafaxine xr.  Pharmacotherapy (Medications Ordered): Meds ordered this encounter  Medications  . HYDROcodone-acetaminophen (NORCO) 7.5-325 MG tablet    Sig: Take 1 tablet by mouth 3 (three) times daily as needed for severe pain. Must last 30 days.    Dispense:  90 tablet    Refill:  0    Chronic Pain. (STOP Act - Not applicable). Fill one day early if closed on scheduled refill date.  Marland Kitchen HYDROcodone-acetaminophen (NORCO) 7.5-325 MG tablet    Sig: Take 1 tablet by mouth 3 (three) times daily as needed for severe pain. Must last 30 days.    Dispense:  90 tablet    Refill:  0    Chronic Pain. (STOP Act - Not applicable). Fill one day early if closed on scheduled refill date.   Orders:  Orders Placed This Encounter  Procedures  . SHOULDER INJECTION    Standing Status:   Future    Standing Expiration Date:   06/25/2021    Scheduling Instructions:     Side: Bilateral     Sedation: Patient's choice.     Timeframe: As soon as schedule allows    Order Specific Question:   Where will this procedure be performed?    Answer:   [facility]    Comments:   Dandy Lazaro   Follow-up plan:   Return in about 1 week (around 04/01/2021) for B/L Shoulder injection PO Valium 5 mg.    Recent Visits Date Type Provider  Dept  01/28/21 Office Visit Gillis Santa, MD [facility]  Showing recent visits within past 90 days and meeting all other requirements Today's Visits Date Type Provider Dept  03/25/21 Office Visit Gillis Santa, MD [facility]  Showing today's visits and meeting all other requirements Future Appointments Date Type Provider Dept  05/13/21 Appointment Gillis Santa, MD [facility]  Showing future appointments within next 90 days and meeting all other requirements  I discussed the assessment and treatment plan with the patient. The patient was provided an opportunity to ask questions and all were answered. The patient agreed with the plan and demonstrated an understanding of the instructions.  Patient advised to call back or seek an in-person evaluation if the symptoms or condition worsens.  Duration of encounter: 30 minutes.  Note by: Gillis Santa, MD Date: 03/25/2021; Time: 3:38 PM

## 2021-04-16 ENCOUNTER — Ambulatory Visit
Admission: RE | Admit: 2021-04-16 | Discharge: 2021-04-16 | Disposition: A | Discharge status: Home / Self Care | Payer: Medicare Other | Source: Ambulatory Visit | Attending: Student in an Organized Health Care Education/Training Program | Admitting: Student in an Organized Health Care Education/Training Program

## 2021-04-16 ENCOUNTER — Other Ambulatory Visit: Payer: Self-pay

## 2021-04-16 ENCOUNTER — Ambulatory Visit (HOSPITAL_BASED_OUTPATIENT_CLINIC_OR_DEPARTMENT_OTHER): Payer: Medicare Other | Admitting: Student in an Organized Health Care Education/Training Program

## 2021-04-16 ENCOUNTER — Encounter: Payer: Self-pay | Admitting: Student in an Organized Health Care Education/Training Program

## 2021-04-16 VITALS — BP 136/96 | HR 102 | Temp 97.1°F | Resp 20 | Ht 62.0 in | Wt 238.0 lb

## 2021-04-16 DIAGNOSIS — G8929 Other chronic pain: Secondary | ICD-10-CM

## 2021-04-16 DIAGNOSIS — M19012 Primary osteoarthritis, left shoulder: Secondary | ICD-10-CM

## 2021-04-16 DIAGNOSIS — M25512 Pain in left shoulder: Secondary | ICD-10-CM | POA: Diagnosis present

## 2021-04-16 DIAGNOSIS — M25511 Pain in right shoulder: Secondary | ICD-10-CM | POA: Insufficient documentation

## 2021-04-16 DIAGNOSIS — M19011 Primary osteoarthritis, right shoulder: Secondary | ICD-10-CM

## 2021-04-16 MED ORDER — METHYLPREDNISOLONE ACETATE 80 MG/ML IJ SUSP
80.0000 mg | Freq: Once | INTRAMUSCULAR | Status: AC
  Administered 2021-04-16: 80 mg via INTRA_ARTICULAR
  Filled 2021-04-16: qty 1

## 2021-04-16 MED ORDER — IOHEXOL 180 MG/ML  SOLN
10.0000 mL | Freq: Once | INTRAMUSCULAR | Status: AC
  Administered 2021-04-16: 10 mL via INTRA_ARTICULAR
  Filled 2021-04-16: qty 20

## 2021-04-16 MED ORDER — ROPIVACAINE HCL 2 MG/ML IJ SOLN
9.0000 mL | Freq: Once | INTRAMUSCULAR | Status: AC
  Administered 2021-04-16: 20 mL via INTRA_ARTICULAR

## 2021-04-16 MED ORDER — DIAZEPAM 5 MG PO TABS
5.0000 mg | ORAL_TABLET | Freq: Once | ORAL | Status: AC
  Administered 2021-04-16: 5 mg via ORAL
  Filled 2021-04-16: qty 1

## 2021-04-16 MED ORDER — ROPIVACAINE HCL 2 MG/ML IJ SOLN
INTRAMUSCULAR | Status: AC
  Filled 2021-04-16: qty 20

## 2021-04-16 NOTE — Progress Notes (Signed)
PROVIDER NOTE: Information contained herein reflects review and annotations entered in association with encounter. Interpretation of such information and data should be left to medically-trained personnel. Information provided to patient can be located elsewhere in the medical record under "Patient Instructions". Document created using STT-dictation technology, any transcriptional errors that may result from process are unintentional.    Patient: Candace Juarez  Service Category: Procedure  Provider: Edward Jolly, MD  DOB: January 28, 1951  DOS: 04/16/2021  Location: [facility]  MRN: [id number]  Setting: Ambulatory - outpatient  Referring Provider: Enid Baas, MD  Type: Established Patient  Specialty: Interventional Pain Management  PCP: Enid Baas, MD   Primary Reason for Visit: Interventional Pain Management Treatment. CC: Shoulder Pain (bilateral)  Procedure:          Anesthesia, Analgesia, Anxiolysis:  Type: Diagnostic Glenohumeral Joint (shoulder) Injection          Primary Purpose: Diagnostic Region: Superior Shoulder Area Level:  Shoulder Target Area: Glenohumeral Joint (shoulder) Approach: Anterior approach. Laterality: Bilateral  Type: Local Anesthesia with PO Valium  Local Anesthetic: Lidocaine 1-2%  Position: Supine   Indications: 1. Primary osteoarthritis of both shoulders   2. Chronic pain of both shoulders    Pain Score: Pre-procedure: 5 /10 Post-procedure: 3/10, significantly improved range of motion primarily shoulder abduction  Pre-op H&P Assessment:  Candace Juarez is a 70 y.o. (year old), female patient, seen today for interventional treatment. She  has a past surgical history that includes Joint replacement (Left); Cholesteatoma excision (Right); vericose; Colonoscopy; Breast enhancement surgery; Cholecystectomy; Hernia repair; Tubal ligation; Total laparoscopic hysterectomy with bilateral salpingo oophorectomy (Bilateral, 10/24/2020); and  Cystocele repair (N/A, 10/24/2020). Candace Juarez has a current medication list which includes the following prescription(s): abatacept, alprazolam, aluminum-magnesium hydroxide-simethicone, cyclosporine, diphenhydramine, diphenoxylate-atropine, estradiol, fluticasone, hydrocodone-acetaminophen, [START ON 04/24/2021] hydrocodone-acetaminophen, hydroxychloroquine, levothyroxine, nystatin, polyethyl glycol-propyl glycol, solifenacin, venlafaxine xr, cetirizine, and doxepin. Her primarily concern today is the Shoulder Pain (bilateral)  Initial Vital Signs:  Pulse/HCG Rate: (!) 102ECG Heart Rate: 99 Temp: (!) 97.1 F (36.2 C) Resp: 16 BP: (!) 154/82 SpO2: 97 %  BMI: Estimated body mass index is 43.53 kg/m as calculated from the following:   Height as of this encounter: 5\' 2"  (1.575 m).   Weight as of this encounter: 238 lb (108 kg).  Risk Assessment: Allergies: Reviewed. She is allergic to rifampin, sulfa antibiotics, nsaids, and sulfur.  Allergy Precautions: None required Coagulopathies: Reviewed. None identified.  Blood-thinner therapy: None at this time Active Infection(s): Reviewed. None identified. Candace Juarez is afebrile  Site Confirmation: Candace Juarez was asked to confirm the procedure and laterality before marking the site Procedure checklist: Completed Consent: Before the procedure and under the influence of no sedative(s), amnesic(s), or anxiolytics, the patient was informed of the treatment options, risks and possible complications. To fulfill our ethical and legal obligations, as recommended by the American Medical Association's Code of Ethics, I have informed the patient of my clinical impression; the nature and purpose of the treatment or procedure; the risks, benefits, and possible complications of the intervention; the alternatives, including doing nothing; the risk(s) and benefit(s) of the alternative treatment(s) or procedure(s); and the risk(s) and benefit(s) of doing nothing. The patient  was provided information about the general risks and possible complications associated with the procedure. These may include, but are not limited to: failure to achieve desired goals, infection, bleeding, organ or nerve damage, allergic reactions, paralysis, and death. In addition, the patient was informed of those risks and complications associated  to the procedure, such as failure to decrease pain; infection; bleeding; organ or nerve damage with subsequent damage to sensory, motor, and/or autonomic systems, resulting in permanent pain, numbness, and/or weakness of one or several areas of the body; allergic reactions; (i.e.: anaphylactic reaction); and/or death. Furthermore, the patient was informed of those risks and complications associated with the medications. These include, but are not limited to: allergic reactions (i.e.: anaphylactic or anaphylactoid reaction(s)); adrenal axis suppression; blood sugar elevation that in diabetics may result in ketoacidosis or comma; water retention that in patients with history of congestive heart failure may result in shortness of breath, pulmonary edema, and decompensation with resultant heart failure; weight gain; swelling or edema; medication-induced neural toxicity; particulate matter embolism and blood vessel occlusion with resultant organ, and/or nervous system infarction; and/or aseptic necrosis of one or more joints. Finally, the patient was informed that Medicine is not an exact science; therefore, there is also the possibility of unforeseen or unpredictable risks and/or possible complications that may result in a catastrophic outcome. The patient indicated having understood very clearly. We have given the patient no guarantees and we have made no promises. Enough time was given to the patient to ask questions, all of which were answered to the patient's satisfaction. Candace Juarez has indicated that she wanted to continue with the procedure. Attestation: I, the  ordering provider, attest that I have discussed with the patient the benefits, risks, side-effects, alternatives, likelihood of achieving goals, and potential problems during recovery for the procedure that I have provided informed consent. Date  Time: 04/16/2021 12:58 PM  Pre-Procedure Preparation:  Monitoring: As per clinic protocol. Respiration, ETCO2, SpO2, BP, heart rate and rhythm monitor placed and checked for adequate function Safety Precautions: Patient was assessed for positional comfort and pressure points before starting the procedure. Time-out: I initiated and conducted the "Time-out" before starting the procedure, as per protocol. The patient was asked to participate by confirming the accuracy of the "Time Out" information. Verification of the correct person, site, and procedure were performed and confirmed by me, the nursing staff, and the patient. "Time-out" conducted as per Joint Commission's Universal Protocol (UP.01.01.01). Time: 1329  Description of Procedure:          Area Prepped: Entire shoulder Area DuraPrep (Iodine Povacrylex [0.7% available iodine] and Isopropyl Alcohol, 74% w/w) Safety Precautions: Aspiration looking for blood return was conducted prior to all injections. At no point did we inject any substances, as a needle was being advanced. No attempts were made at seeking any paresthesias. Safe injection practices and needle disposal techniques used. Medications properly checked for expiration dates. SDV (single dose vial) medications used. Description of the Procedure: Protocol guidelines were followed. The patient was placed in position over the procedure table. The target area was identified and the area prepped in the usual manner. Skin & deeper tissues infiltrated with local anesthetic. Appropriate amount of time allowed to pass for local anesthetics to take effect. The procedure needles were then advanced to the target area. Proper needle placement secured. Negative  aspiration confirmed. Solution injected in intermittent fashion, asking for systemic symptoms every 0.5cc of injectate. The needles were then removed and the area cleansed, making sure to leave some of the prepping solution back to take advantage of its long term bactericidal properties.         Vitals:   04/16/21 1304 04/16/21 1329 04/16/21 1334 04/16/21 1339  BP: (!) 154/82 (!) 155/81 (!) 163/77   Pulse: (!) 102  Resp: Temp: (!) 97.1 F (36.2 C)     TempSrc: Temporal     SpO2: 97% 97% 96% 96%  Weight: 238 lb (108 kg)     Height:  (1.575 m)       Start Time: 1329 hrs. End Time: 1339 hrs. Materials:  Needle(s) Type: Spinal Needle Gauge: 22G Length: 3.5-in Medication(s): Please see orders for medications and dosing details. 10 cc solution made of 9 cc of 0.2 Ropivacaine and 1 cc of methylprednisolone (80 mg/cc).  5 cc injected in each shoulder Imaging Guidance (Non-Spinal):          Type of Imaging Technique: Fluoroscopy Guidance (Non-Spinal) Indication(s): Assistance in needle guidance and placement for procedures requiring needle placement in or near specific anatomical locations not easily accessible without such assistance. Exposure Time: Please see nurses notes. Contrast: Before injecting any contrast, we confirmed that the patient did not have an allergy to iodine, shellfish, or radiological contrast. Once satisfactory needle placement was completed at the desired level, radiological contrast was injected. Contrast injected under live fluoroscopy. No contrast complications. See chart for type and volume of contrast used. Fluoroscopic Guidance: I was personally present during the use of fluoroscopy. "Tunnel Vision Technique" used to obtain the best possible view of the target area. Parallax error corrected before commencing the procedure. "Direction-depth-direction" technique used to introduce the needle under continuous pulsed fluoroscopy. Once target was  reached, antero-posterior, oblique, and lateral fluoroscopic projection used confirm needle placement in all planes. Images permanently stored in EMR. Interpretation: I personally interpreted the imaging intraoperatively. Adequate needle placement confirmed in multiple planes. Appropriate spread of contrast into desired area was observed. No evidence of afferent or efferent intravascular uptake. Permanent images saved into the patient's record.   Post-operative Assessment:  Post-procedure Vital Signs:  Pulse/HCG Rate: (!) 10294 Temp: (!) 97.1 F (36.2 C) Resp: 20 BP: (!) 163/77 SpO2: 96 %  EBL: None  Complications: No immediate post-treatment complications observed by team, or reported by patient.  Note: The patient tolerated the entire procedure well. A repeat set of vitals were taken after the procedure and the patient was kept under observation following institutional policy, for this type of procedure. Post-procedural neurological assessment was performed, showing return to baseline, prior to discharge. The patient was provided with post-procedure discharge instructions, including a section on how to identify potential problems. Should any problems arise concerning this procedure, the patient was given instructions to immediately contact us, at any time, without hesitation. In any case, we plan to contact the patient by telephone for a follow-up status report regarding this interventional procedure.  Comments:  No additional relevant information.  Plan of Care  Orders:  Orders Placed This Encounter  Procedures  . DG PAIN [facility] C-ARM 1-60 MIN NO REPORT    Intraoperative interpretation by procedural physician at [facility].    Standing Status:   Standing    Number of Occurrences:   1    Order Specific Question:   Reason for exam:    Answer:   Assistance in needle guidance and placement for procedures requiring needle placement in or near specific anatomical locations not  easily accessible without such assistance.   Chronic Opioid Analgesic:  Norco 7.5 mg 3 times daily as needed, quantity 90/month; MME equals 22.5     Medications ordered for procedure: Meds ordered this encounter  Medications  . iohexol (OMNIPAQUE) 180 MG/ML injection 10 mL    Must be Myelogram-compatible. If  not available, you may substitute with a water-soluble, non-ionic, hypoallergenic, myelogram-compatible radiological contrast medium.  . methylPREDNISolone acetate (DEPO-MEDROL) injection 80 mg  . ropivacaine (PF) 2 mg/mL (0.2%) (NAROPIN) injection 9 mL  . diazepam (VALIUM) tablet 5 mg   Medications administered: We administered iohexol, methylPREDNISolone acetate, ropivacaine (PF) 2 mg/mL (0.2%), and diazepam.  See the medical record for exact dosing, route, and time of administration.  Follow-up plan:   Return for Keep sch. appt.    Recent Visits Date Type Provider Dept  03/25/21 Office Visit Edward Jolly, MD [facility]  01/28/21 Office Visit Edward Jolly, MD [facility]  Showing recent visits within past 90 days and meeting all other requirements Today's Visits Date Type Provider Dept  04/16/21 Procedure visit Edward Jolly, MD [facility]  Showing today's visits and meeting all other requirements Future Appointments Date Type Provider Dept  05/13/21 Appointment Edward Jolly, MD [facility]  Showing future appointments within next 90 days and meeting all other requirements  Disposition: Discharge home  Discharge (Date  Time): 04/16/2021;   hrs.   Primary Care Physician: Enid Baas, MD Location: [facility] Note by: Edward Jolly, MD Date: 04/16/2021; Time: 1:51 PM  Disclaimer:  Medicine is not an exact science. The only guarantee in medicine is that nothing is guaranteed. It is important to note that the decision to proceed with this intervention was based on the information collected from  the patient. The Data and conclusions were drawn from the patient's questionnaire, the interview, and the physical examination. Because the information was provided in large part by the patient, it cannot be guaranteed that it has not been purposely or unconsciously manipulated. Every effort has been made to obtain as much relevant data as possible for this evaluation. It is important to note that the conclusions that lead to this procedure are derived in large part from the available data. Always take into account that the treatment will also be dependent on availability of resources and existing treatment guidelines, considered by other Pain Management Practitioners as being common knowledge and practice, at the time of the intervention. For Medico-Legal purposes, it is also important to point out that variation in procedural techniques and pharmacological choices are the acceptable norm. The indications, contraindications, technique, and results of the above procedure should only be interpreted and judged by a Board-Certified Interventional Pain Specialist with extensive familiarity and expertise in the same exact procedure and technique.

## 2021-04-16 NOTE — Progress Notes (Signed)
Safety precautions to be maintained throughout the outpatient stay will include: orient to surroundings, keep bed in low position, maintain call bell within reach at all times, provide assistance with transfer out of bed and ambulation.  

## 2021-04-16 NOTE — Patient Instructions (Signed)

## 2021-04-17 ENCOUNTER — Telehealth: Payer: Self-pay | Admitting: *Deleted

## 2021-04-17 NOTE — Telephone Encounter (Signed)
No answer. LVM for her to call for any post procedure issues. 

## 2021-05-13 ENCOUNTER — Encounter: Payer: Medicare Other | Admitting: Student in an Organized Health Care Education/Training Program

## 2021-05-15 ENCOUNTER — Encounter: Payer: Self-pay | Admitting: Student in an Organized Health Care Education/Training Program

## 2021-05-15 ENCOUNTER — Ambulatory Visit
Payer: Medicare Other | Attending: Student in an Organized Health Care Education/Training Program | Admitting: Student in an Organized Health Care Education/Training Program

## 2021-05-15 ENCOUNTER — Other Ambulatory Visit: Payer: Self-pay

## 2021-05-15 VITALS — BP 151/68 | HR 80 | Temp 97.7°F | Resp 16 | Ht 62.0 in | Wt 235.0 lb

## 2021-05-15 DIAGNOSIS — M19011 Primary osteoarthritis, right shoulder: Secondary | ICD-10-CM

## 2021-05-15 DIAGNOSIS — M25512 Pain in left shoulder: Secondary | ICD-10-CM | POA: Diagnosis present

## 2021-05-15 DIAGNOSIS — M069 Rheumatoid arthritis, unspecified: Secondary | ICD-10-CM

## 2021-05-15 DIAGNOSIS — M2550 Pain in unspecified joint: Secondary | ICD-10-CM

## 2021-05-15 DIAGNOSIS — G8929 Other chronic pain: Secondary | ICD-10-CM

## 2021-05-15 DIAGNOSIS — M19012 Primary osteoarthritis, left shoulder: Secondary | ICD-10-CM | POA: Diagnosis present

## 2021-05-15 DIAGNOSIS — M25511 Pain in right shoulder: Secondary | ICD-10-CM | POA: Diagnosis present

## 2021-05-15 DIAGNOSIS — G894 Chronic pain syndrome: Secondary | ICD-10-CM | POA: Diagnosis present

## 2021-05-15 DIAGNOSIS — Z9889 Other specified postprocedural states: Secondary | ICD-10-CM

## 2021-05-15 DIAGNOSIS — Z96642 Presence of left artificial hip joint: Secondary | ICD-10-CM

## 2021-05-15 MED ORDER — HYDROCODONE-ACETAMINOPHEN 7.5-325 MG PO TABS: 1.0000 | ORAL_TABLET | Freq: Three times a day (TID) | ORAL | 0 refills | Status: AC | PRN

## 2021-05-15 NOTE — Progress Notes (Signed)
PROVIDER NOTE: Information contained herein reflects review and annotations entered in association with encounter. Interpretation of such information and data should be left to medically-trained personnel. Information provided to patient can be located elsewhere in the medical record under "Patient Instructions". Document created using STT-dictation technology, any transcriptional errors that may result from process are unintentional.    Patient: Candace Juarez  Service Category: E/M  Provider: Gillis Santa, MD  DOB: 06/26/1951  DOS: 05/15/2021  Specialty: Interventional Pain Management  MRN: [id number]  Setting: Ambulatory outpatient  PCP: Gladstone Lighter, MD  Type: Established Patient    Referring Provider: Gladstone Lighter, MD  Location: Office  Delivery: Face-to-face     HPI  [name], a 70 y.o. year old female, is here today because of her Primary osteoarthritis of both shoulders [M19.011, M19.012]. [name] primary complain today is Back Pain and Arm Pain (bilateral) Last encounter: My last encounter with her was on 04/16/2021. Pertinent problems: [name] has Polyarthralgia; History of left hip replacement; H/O right knee surgery; Chronic pain of both knees; Chronic pain of both hips; Chronic pain syndrome; and Pain management contract signed on their pertinent problem list. Pain Assessment: Severity of Chronic pain is reported as a 2 /10. Location: Arm Right, Left/denies, pain was going down arm before procedure. Onset: More than a month ago. Quality: Discomfort, Sore. Timing: Intermittent. Modifying factor(s): ice topical. Vitals:  height is $RemoveB'5\' 2"'cyeNrsra$  (1.575 m) and weight is 235 lb (106.6 kg). Her temperature is 97.7 F (36.5 C). Her blood pressure is 151/68 (abnormal) and her pulse is 80. Her respiration is 16 and oxygen saturation is 98%.   Reason for encounter: both, medication management and post-procedure assessment.     Post-Procedure Evaluation  Procedure (04/16/2021):   ype:  Diagnostic Glenohumeral Joint (shoulder) Injection          Primary Purpose: Diagnostic Region: Superior Shoulder Area Level:  Shoulder Target Area: Glenohumeral Joint (shoulder) Approach: Anterior approach. Laterality: Bilateral  Anxiolysis: Please see nurses note.  Effectiveness during initial hour after procedure (Ultra-Short Term Relief): 50 %   Local anesthetic used: Long-acting (4-6 hours) Effectiveness: Defined as any analgesic benefit obtained secondary to the administration of local anesthetics. This carries significant diagnostic value as to the etiological location, or anatomical origin, of the pain. Duration of benefit is expected to coincide with the duration of the local anesthetic used.  Effectiveness during initial 4-6 hours after procedure (Short-Term Relief): 50 %   Long-term benefit: Defined as any relief past the pharmacologic duration of the local anesthetics.  Effectiveness past the initial 6 hours after procedure (Long-Term Relief): 100 % (current)   Benefits, current: Defined as benefit present at the time of this evaluation.   Analgesia:  90-100% better Function: [name] reports improvement in function ROM: [name] reports improvement in ROM  Significant pain relief and improvement in range of motion after bilateral shoulder steroid injection, repeat as needed  Pharmacotherapy Assessment   Analgesic: Norco 7.5 mg 3 times daily as needed, quantity 90/month; MME equals 22.5     Monitoring: [place] PMP: PDMP reviewed during this encounter.       Pharmacotherapy: No side-effects or adverse reactions reported. Compliance: No problems identified. Effectiveness: Clinically acceptable.  Ignatius Specking, RN  05/15/2021  1:17 PM  Sign when Signing Visit Nursing Pain Medication Assessment:  Safety precautions to be maintained throughout the outpatient stay will include: orient to surroundings, keep bed in low position, maintain call bell within reach at all  times,  provide assistance with transfer out of bed and ambulation.  Medication Inspection Compliance: Pill count conducted under aseptic conditions, in front of the patient. Neither the pills nor the bottle was removed from the patient's sight at any time. Once count was completed pills were immediately returned to the patient in their original bottle.  Medication: Hydrocodone/APAP Pill/Patch Count:  30 of 90 pills remain Pill/Patch Appearance: Markings consistent with prescribed medication Bottle Appearance: Standard pharmacy container. Clearly labeled. Filled Date: 6 / 32 / 2022 Last Medication intake:  Today      UDS:  Summary  Date Value Ref Range Status  11/21/2020 Note  Final    Comment:    ==================================================================== Compliance Drug Analysis, Ur ==================================================================== Test                             Result       Flag       Units  Drug Present and Declared for Prescription Verification   Alprazolam                     137          EXPECTED   ng/mg creat   Alpha-hydroxyalprazolam        123          EXPECTED   ng/mg creat    Source of alprazolam is a scheduled prescription medication. Alpha-    hydroxyalprazolam is an expected metabolite of alprazolam.    Hydrocodone                    1206         EXPECTED   ng/mg creat   Hydromorphone                  540          EXPECTED   ng/mg creat   Dihydrocodeine                 246          EXPECTED   ng/mg creat   Norhydrocodone                 1460         EXPECTED   ng/mg creat    Sources of hydrocodone include scheduled prescription medications.    Hydromorphone, dihydrocodeine and norhydrocodone are expected    metabolites of hydrocodone. Hydromorphone and dihydrocodeine are    also available as scheduled prescription medications.    Venlafaxine                    PRESENT      EXPECTED   Desmethylvenlafaxine           PRESENT      EXPECTED     Desmethylvenlafaxine is an expected metabolite of venlafaxine.    Acetaminophen                  PRESENT      EXPECTED  Drug Present not Declared for Prescription Verification   Gabapentin                     PRESENT      UNEXPECTED  Drug Absent but Declared for Prescription Verification   Diphenhydramine                Not Detected UNEXPECTED ==================================================================== Test  Result    Flag   Units      Ref Range   Creatinine              35               mg/dL      >=20 ==================================================================== Declared Medications:  The flagging and interpretation on this report are based on the  following declared medications.  Unexpected results may arise from  inaccuracies in the declared medications.   **Note: The testing scope of this panel includes these medications:   Alprazolam  Diphenhydramine (Sominex)  Hydrocodone  Venlafaxine   **Note: The testing scope of this panel does not include small to  moderate amounts of these reported medications:   Acetaminophen   **Note: The testing scope of this panel does not include the  following reported medications:   Abatacept  Aluminum Hydroxide (Maalox)  Atropine (Lomotil)  Cetirizine (Zyrtec)  Diphenoxylate (Lomotil)  Docusate (Colace)  Estradiol (Estrace)  Eye Drop  Fluticasone  Hydroxychloroquine  Levothyroxine  Magnesium Hydroxide (Maalox)  Nystatin  Polyethylene Glycol  Solifenacin  Vitamin D2 ==================================================================== For clinical consultation, please call [phone]. ====================================================================      ROS  Constitutional: Denies any fever or chills Gastrointestinal: No reported hemesis, hematochezia, vomiting, or acute GI distress Musculoskeletal:  bilateral shoulder and arm pain, significant improvement after bilateral  glenohumeral joint injection Neurological: No reported episodes of acute onset apraxia, aphasia, dysarthria, agnosia, amnesia, paralysis, loss of coordination, or loss of consciousness  Medication Review  ALPRAZolam, Abatacept, HYDROcodone-acetaminophen, Polyethyl Glycol-Propyl Glycol, aluminum-magnesium hydroxide-simethicone, cetirizine, cycloSPORINE, diphenhydrAMINE, diphenoxylate-atropine, doxepin, estradiol, fluticasone, hydroxychloroquine, levothyroxine, nystatin, solifenacin, and venlafaxine XR  History Review  Allergy: [name] is allergic to rifampin, sulfa antibiotics, nsaids, and sulfur. Drug: [name]  reports no history of drug use. Alcohol:  reports no history of alcohol use. Tobacco:  reports that she has never smoked. She has never used smokeless tobacco. Social: [name]  reports that she has never smoked. She has never used smokeless tobacco. She reports that she does not drink alcohol and does not use drugs. Medical:  has a past medical history of Arthritis, History of hiatal hernia, Hypothyroidism, Lupus ([place]), Rheumatoid arthritis ([place]), and Rosacea. Surgical: [name]  has a past surgical history that includes Joint replacement (Left); Cholesteatoma excision (Right); vericose; Colonoscopy; Breast enhancement surgery; Cholecystectomy; Hernia repair; Tubal ligation; Total laparoscopic hysterectomy with bilateral salpingo oophorectomy (Bilateral, 10/24/2020); and Cystocele repair (N/A, 10/24/2020). Family: family history is not on file.  Laboratory Chemistry Profile   Renal Lab Results  Component Value Date   BUN 16 10/25/2020   CREATININE 0.87 10/25/2020   GFRNONAA >60 10/25/2020     Hepatic Lab Results  Component Value Date   AST 31 12/17/2020   ALT 22 12/17/2020   ALBUMIN 4.2 12/17/2020   ALKPHOS 71 12/17/2020   LIPASE 28 09/06/2020     Electrolytes Lab Results  Component Value Date   NA 136 10/25/2020   K 4.2 10/25/2020   CL 100 10/25/2020   CALCIUM 8.3  (L) 10/25/2020     Bone No results found for: VD25OH, VD125OH2TOT, MV7846NG2, XB2841LK4, 25OHVITD1, 25OHVITD2, 25OHVITD3, TESTOFREE, TESTOSTERONE   Inflammation (CRP: Acute Phase) (ESR: Chronic Phase) No results found for: CRP, ESRSEDRATE, LATICACIDVEN     Note: Above Lab results reviewed.   Physical Exam  General appearance: Well nourished, well developed, and well hydrated. In no apparent acute distress Mental status: Alert, oriented x 3 (person, place, & time)  Respiratory: No evidence of acute respiratory distress Eyes: PERLA Vitals: BP (!) 151/68   Pulse 80   Temp 97.7 F (36.5 C)   Resp 16   Ht $R'5\' 2"'sO$  (1.575 m)   Wt 235 lb (106.6 kg)   SpO2 98%   BMI 42.98 kg/m  BMI: Estimated body mass index is 42.98 kg/m as calculated from the following:   Height as of this encounter: $RemoveBeforeD'5\' 2"'QZTnCdyGjVbNCI$  (1.575 m).   Weight as of this encounter: 235 lb (106.6 kg). Ideal: Ideal body weight: 50.1 kg (110 lb 7.2 oz) Adjusted ideal body weight: 72.7 kg (160 lb 4.3 oz)   Cervical Spine Area Exam  Skin & Axial Inspection: No masses, redness, edema, swelling, or associated skin lesions Alignment: Symmetrical Functional ROM: Pain restricted ROM, bilaterally, improved after injection Stability: No instability detected Muscle Tone/Strength: Functionally intact. No obvious neuro-muscular anomalies detected. Sensory (Neurological): Musculoskeletal pain pattern improved after injection Palpation: No palpable anomalies             Upper Extremity (UE) Exam    Side: Right upper extremity  Side: Left upper extremity  Skin & Extremity Inspection: Skin color, temperature, and hair growth are WNL. No peripheral edema or cyanosis. No masses, redness, swelling, asymmetry, or associated skin lesions. No contractures.  Skin & Extremity Inspection: Skin color, temperature, and hair growth are WNL. No peripheral edema or cyanosis. No masses, redness, swelling, asymmetry, or associated skin lesions. No contractures.   Functional ROM: Pain restricted ROM for shoulder and elbow  Functional ROM: Pain restricted ROM for shoulder and elbow  Muscle Tone/Strength: Functionally intact. No obvious neuro-muscular anomalies detected.  Muscle Tone/Strength: Functionally intact. No obvious neuro-muscular anomalies detected.  Sensory (Neurological): Arthropathic arthralgia          Sensory (Neurological): Arthropathic arthralgia          Palpation: No palpable anomalies              Palpation: No palpable anomalies              Provocative Test(s):  Phalen's test: deferred Tinel's test: deferred Apley's scratch test (touch opposite shoulder):  Action 1 (Across chest): Improved ROM Action 2 (Overhead): Improved ROM Action 3 (LB reach): Improved ROM   Provocative Test(s):  Phalen's test: deferred Tinel's test: deferred Apley's scratch test (touch opposite shoulder):  Action 1 (Across chest): Improved ROM Action 2 (Overhead): Improved ROM Action 3 (LB reach): Improved ROM    Lumbar Exam  Skin & Axial Inspection: No masses, redness, or swelling Alignment: Symmetrical Functional ROM: Unrestricted ROM       Stability: No instability detected Muscle Tone/Strength: Functionally intact. No obvious neuro-muscular anomalies detected. Sensory (Neurological): Musculoskeletal pain pattern     Gait & Posture Assessment  Ambulation: Unassisted Gait: Relatively normal for age and body habitus Posture: WNL    Lower Extremity Exam      Side: Right lower extremity   Side: Left lower extremity  Stability: No instability observed           Stability: No instability observed          Skin & Extremity Inspection: Skin color, temperature, and hair growth are WNL. No peripheral edema or cyanosis. No masses, redness, swelling, asymmetry, or associated skin lesions. No contractures.   Skin & Extremity Inspection: Evidence of prior arthroplastic surgery  Functional ROM: Pain restricted ROM for hip and knee joints            Functional ROM: Unrestricted ROM  Muscle Tone/Strength: Functionally intact. No obvious neuro-muscular anomalies detected.   Muscle Tone/Strength: Functionally intact. No obvious neuro-muscular anomalies detected.  Sensory (Neurological): Unimpaired         Sensory (Neurological): Arthropathic arthralgia knee        DTR: Patellar: deferred today Achilles: deferred today Plantar: deferred today   DTR: Patellar: deferred today Achilles: deferred today Plantar: deferred today  Palpation: No palpable anomalies   Palpation: No palpable anomalies      Assessment   Status Diagnosis  Controlled Improving Persistent 1. Primary osteoarthritis of both shoulders   2. Chronic pain of both shoulders   3. Rheumatoid arthritis involving multiple sites, unspecified whether rheumatoid factor present ([place])   4. Polyarthralgia   5. History of left hip replacement   6. H/O right knee surgery   7. Chronic pain syndrome        Plan of Care   [name] has a current medication list which includes the following long-term medication(s): abatacept, cetirizine, diphenhydramine, estradiol, fluticasone, levothyroxine, venlafaxine xr, and doxepin.  Pharmacotherapy (Medications Ordered): Meds ordered this encounter  Medications   HYDROcodone-acetaminophen (NORCO) 7.5-325 MG tablet    Sig: Take 1 tablet by mouth 3 (three) times daily as needed for severe pain. Must last 30 days.    Dispense:  90 tablet    Refill:  0    Chronic Pain. (STOP Act - Not applicable). Fill one day early if closed on scheduled refill date.   HYDROcodone-acetaminophen (NORCO) 7.5-325 MG tablet    Sig: Take 1 tablet by mouth 3 (three) times daily as needed for severe pain. Must last 30 days.    Dispense:  90 tablet    Refill:  0    Chronic Pain. (STOP Act - Not applicable). Fill one day early if closed on scheduled refill date.   HYDROcodone-acetaminophen (NORCO) 7.5-325 MG tablet    Sig: Take 1 tablet by  mouth 3 (three) times daily as needed for severe pain. Must last 30 days.    Dispense:  90 tablet    Refill:  0    Chronic Pain. (STOP Act - Not applicable). Fill one day early if closed on scheduled refill date.    Follow-up plan:   Return in about 3 months (around 08/15/2021) for Medication Management, in person.    Recent Visits Date Type Provider Dept  04/16/21 Procedure visit Gillis Santa, MD [facility]  03/25/21 Office Visit Gillis Santa, MD [facility]  Showing recent visits within past 90 days and meeting all other requirements Today's Visits Date Type Provider Dept  05/15/21 Office Visit Gillis Santa, MD [facility]  Showing today's visits and meeting all other requirements Future Appointments No visits were found meeting these conditions. Showing future appointments within next 90 days and meeting all other requirements I discussed the assessment and treatment plan with the patient. The patient was provided an opportunity to ask questions and all were answered. The patient agreed with the plan and demonstrated an understanding of the instructions.  Patient advised to call back or seek an in-person evaluation if the symptoms or condition worsens.  Duration of encounter: 30 minutes.  Note by: Gillis Santa, MD Date: 05/15/2021; Time: 1:59 PM

## 2021-05-15 NOTE — Progress Notes (Signed)
Nursing Pain Medication Assessment:  Safety precautions to be maintained throughout the outpatient stay will include: orient to surroundings, keep bed in low position, maintain call bell within reach at all times, provide assistance with transfer out of bed and ambulation.  Medication Inspection Compliance: Pill count conducted under aseptic conditions, in front of the patient. Neither the pills nor the bottle was removed from the patient's sight at any time. Once count was completed pills were immediately returned to the patient in their original bottle.  Medication: Hydrocodone/APAP Pill/Patch Count:  30 of 90 pills remain Pill/Patch Appearance: Markings consistent with prescribed medication Bottle Appearance: Standard pharmacy container. Clearly labeled. Filled Date: 6 / 70 / 2022 Last Medication intake:  Today

## 2021-08-05 ENCOUNTER — Other Ambulatory Visit: Payer: Self-pay | Admitting: Physician Assistant

## 2021-08-05 ENCOUNTER — Ambulatory Visit
Admission: RE | Admit: 2021-08-05 | Discharge: 2021-08-05 | Disposition: A | Discharge status: Home / Self Care | Payer: Medicare Other | Source: Ambulatory Visit | Attending: Physician Assistant | Admitting: Physician Assistant

## 2021-08-05 ENCOUNTER — Other Ambulatory Visit
Admission: RE | Admit: 2021-08-05 | Discharge: 2021-08-05 | Disposition: A | Discharge status: Home / Self Care | Payer: Medicare Other | Source: Ambulatory Visit | Attending: Physician Assistant | Admitting: Physician Assistant

## 2021-08-05 ENCOUNTER — Other Ambulatory Visit: Payer: Self-pay

## 2021-08-05 DIAGNOSIS — R0602 Shortness of breath: Secondary | ICD-10-CM | POA: Insufficient documentation

## 2021-08-05 DIAGNOSIS — R000 Tachycardia, unspecified: Secondary | ICD-10-CM

## 2021-08-05 DIAGNOSIS — M7989 Other specified soft tissue disorders: Secondary | ICD-10-CM | POA: Diagnosis present

## 2021-08-05 LAB — TROPONIN I (HIGH SENSITIVITY): Troponin I (High Sensitivity): 28 ng/L — ABNORMAL HIGH (ref <18)

## 2021-08-05 MED ORDER — IOHEXOL 350 MG/ML SOLN
80.0000 mL | Freq: Once | INTRAVENOUS | Status: AC | PRN
  Administered 2021-08-05: 80 mL via INTRAVENOUS

## 2021-08-14 ENCOUNTER — Other Ambulatory Visit: Payer: Self-pay

## 2021-08-14 ENCOUNTER — Encounter: Payer: Self-pay | Admitting: Student in an Organized Health Care Education/Training Program

## 2021-08-14 ENCOUNTER — Ambulatory Visit
Payer: Medicare Other | Attending: Student in an Organized Health Care Education/Training Program | Admitting: Student in an Organized Health Care Education/Training Program

## 2021-08-14 VITALS — BP 130/78 | HR 90 | Temp 97.2°F | Resp 15 | Ht 62.0 in | Wt 233.0 lb

## 2021-08-14 DIAGNOSIS — G8929 Other chronic pain: Secondary | ICD-10-CM

## 2021-08-14 DIAGNOSIS — M069 Rheumatoid arthritis, unspecified: Secondary | ICD-10-CM

## 2021-08-14 DIAGNOSIS — Z96642 Presence of left artificial hip joint: Secondary | ICD-10-CM

## 2021-08-14 DIAGNOSIS — M19012 Primary osteoarthritis, left shoulder: Secondary | ICD-10-CM

## 2021-08-14 DIAGNOSIS — Z0289 Encounter for other administrative examinations: Secondary | ICD-10-CM

## 2021-08-14 DIAGNOSIS — G894 Chronic pain syndrome: Secondary | ICD-10-CM

## 2021-08-14 DIAGNOSIS — Z9889 Other specified postprocedural states: Secondary | ICD-10-CM

## 2021-08-14 DIAGNOSIS — M2550 Pain in unspecified joint: Secondary | ICD-10-CM

## 2021-08-14 DIAGNOSIS — M19011 Primary osteoarthritis, right shoulder: Secondary | ICD-10-CM

## 2021-08-14 DIAGNOSIS — M25511 Pain in right shoulder: Secondary | ICD-10-CM

## 2021-08-14 DIAGNOSIS — M25512 Pain in left shoulder: Secondary | ICD-10-CM

## 2021-08-14 NOTE — Progress Notes (Signed)
Patient unaware that she still has 2 prescriptions remaining at her pharmacy that can be filled.  No appointment needed today.  Follow-up in 2 months for medication refill.

## 2021-08-14 NOTE — Patient Instructions (Signed)
Okay thank you

## 2021-08-14 NOTE — Progress Notes (Signed)
Nursing Pain Medication Assessment:  Safety precautions to be maintained throughout the outpatient stay will include: orient to surroundings, keep bed in low position, maintain call bell within reach at all times, provide assistance with transfer out of bed and ambulation.  Medication Inspection Compliance: Pill count conducted under aseptic conditions, in front of the patient. Neither the pills nor the bottle was removed from the patient's sight at any time. Once count was completed pills were immediately returned to the patient in their original bottle.  Medication: Hydrocodone/APAP Pill/Patch Count:  0 of 90 pills remain Pill/Patch Appearance: Markings consistent with prescribed medication Bottle Appearance: Standard pharmacy container. Clearly labeled. Filled Date: 09 / 01 / 2022 Last Medication intake:  "ran out within the last week"

## 2021-08-19 ENCOUNTER — Other Ambulatory Visit: Payer: Self-pay | Admitting: Internal Medicine

## 2021-08-19 ENCOUNTER — Ambulatory Visit
Admission: RE | Admit: 2021-08-19 | Discharge: 2021-08-19 | Disposition: A | Discharge status: Home / Self Care | Payer: Medicare Other | Source: Ambulatory Visit | Attending: Internal Medicine | Admitting: Internal Medicine

## 2021-08-19 ENCOUNTER — Other Ambulatory Visit: Payer: Self-pay

## 2021-08-19 DIAGNOSIS — M7989 Other specified soft tissue disorders: Secondary | ICD-10-CM

## 2021-08-27 DIAGNOSIS — I509 Heart failure, unspecified: Secondary | ICD-10-CM

## 2021-08-27 HISTORY — DX: Heart failure, unspecified: I50.9

## 2021-09-04 ENCOUNTER — Other Ambulatory Visit
Admission: RE | Admit: 2021-09-04 | Discharge: 2021-09-04 | Disposition: A | Discharge status: Home / Self Care | Payer: Medicare Other | Source: Ambulatory Visit | Attending: Cardiology | Admitting: Cardiology

## 2021-09-04 DIAGNOSIS — I5020 Unspecified systolic (congestive) heart failure: Secondary | ICD-10-CM | POA: Insufficient documentation

## 2021-09-04 DIAGNOSIS — I5082 Biventricular heart failure: Secondary | ICD-10-CM | POA: Diagnosis present

## 2021-09-04 LAB — BRAIN NATRIURETIC PEPTIDE: B Natriuretic Peptide: 439.4 pg/mL — ABNORMAL HIGH (ref 0.0–100.0)

## 2021-10-08 DIAGNOSIS — I2510 Atherosclerotic heart disease of native coronary artery without angina pectoris: Secondary | ICD-10-CM

## 2021-10-08 HISTORY — DX: Atherosclerotic heart disease of native coronary artery without angina pectoris: I25.10

## 2021-10-09 ENCOUNTER — Ambulatory Visit
Payer: Medicare Other | Attending: Student in an Organized Health Care Education/Training Program | Admitting: Student in an Organized Health Care Education/Training Program

## 2021-10-09 ENCOUNTER — Other Ambulatory Visit: Payer: Self-pay

## 2021-10-09 ENCOUNTER — Encounter: Payer: Self-pay | Admitting: Student in an Organized Health Care Education/Training Program

## 2021-10-09 VITALS — BP 114/69 | HR 65 | Temp 97.7°F | Resp 16 | Ht 62.0 in | Wt 220.0 lb

## 2021-10-09 DIAGNOSIS — Z0289 Encounter for other administrative examinations: Secondary | ICD-10-CM | POA: Diagnosis present

## 2021-10-09 DIAGNOSIS — Z96642 Presence of left artificial hip joint: Secondary | ICD-10-CM | POA: Diagnosis present

## 2021-10-09 DIAGNOSIS — Z9889 Other specified postprocedural states: Secondary | ICD-10-CM | POA: Insufficient documentation

## 2021-10-09 DIAGNOSIS — M069 Rheumatoid arthritis, unspecified: Secondary | ICD-10-CM | POA: Diagnosis present

## 2021-10-09 DIAGNOSIS — G894 Chronic pain syndrome: Secondary | ICD-10-CM | POA: Diagnosis present

## 2021-10-09 DIAGNOSIS — M25512 Pain in left shoulder: Secondary | ICD-10-CM | POA: Insufficient documentation

## 2021-10-09 DIAGNOSIS — M19012 Primary osteoarthritis, left shoulder: Secondary | ICD-10-CM | POA: Diagnosis present

## 2021-10-09 DIAGNOSIS — G8929 Other chronic pain: Secondary | ICD-10-CM | POA: Diagnosis present

## 2021-10-09 DIAGNOSIS — M19011 Primary osteoarthritis, right shoulder: Secondary | ICD-10-CM | POA: Diagnosis present

## 2021-10-09 DIAGNOSIS — M2550 Pain in unspecified joint: Secondary | ICD-10-CM | POA: Insufficient documentation

## 2021-10-09 DIAGNOSIS — M25511 Pain in right shoulder: Secondary | ICD-10-CM | POA: Diagnosis present

## 2021-10-09 MED ORDER — HYDROCODONE-ACETAMINOPHEN 7.5-325 MG PO TABS: 1.0000 | ORAL_TABLET | Freq: Three times a day (TID) | ORAL | 0 refills | Status: DC | PRN

## 2021-10-09 MED ORDER — HYDROCODONE-ACETAMINOPHEN 7.5-325 MG PO TABS: 1.0000 | ORAL_TABLET | Freq: Three times a day (TID) | ORAL | 0 refills | Status: AC | PRN

## 2021-10-09 NOTE — Progress Notes (Signed)
PROVIDER NOTE: Information contained herein reflects review and annotations entered in association with encounter. Interpretation of such information and data should be left to medically-trained personnel. Information provided to patient can be located elsewhere in the medical record under "Patient Instructions". Document created using STT-dictation technology, any transcriptional errors that may result from process are unintentional.    Patient: Candace Juarez  Service Category: E/M  Provider: Gillis Santa, MD  DOB: 02/15/1951  DOS: 10/09/2021  Specialty: Interventional Pain Management  MRN: [id number]  Setting: Ambulatory outpatient  PCP: Gladstone Lighter, MD  Type: Established Patient    Referring Provider: Gladstone Lighter, MD  Location: Office  Delivery: Face-to-face     HPI  [name], a 70 y.o. year old female, is here today because of her Primary osteoarthritis of both shoulders [M19.011, M19.012]. [name] primary complain today is Joint Pain Last encounter: My last encounter with her was on 05/15/21 Pertinent problems: [name] has Polyarthralgia; History of left hip replacement; H/O right knee surgery; Chronic pain of both knees; Chronic pain of both hips; Chronic pain syndrome; and Pain management contract signed on their pertinent problem list. Pain Assessment: Severity of Chronic pain is reported as a 4 /10. Location: Other (Comment) (joint)  / . Onset: More than a month ago. Quality: Aching, Sharp. Timing: Constant. Modifying factor(s): meds. Vitals:  height is $RemoveB'5\' 2"'NcMacdgU$  (1.575 m) and weight is 220 lb (99.8 kg). Her temporal temperature is 97.7 F (36.5 C). Her blood pressure is 114/69 and her pulse is 65. Her respiration is 16 and oxygen saturation is 100%.   Reason for encounter: medication management.    -since her last visit with me, patient was diagnosed with CHF. She was having sx of SOB, decreased appetite, abdominal bloating  with exertion. Referred by PCP to  Cardiologist -increased edema of right leg  -ECHO showing 35-50% with mild RV dysfnc- placed on Torsemide, on Losartan and Metoprolol -Has Lupus, RFA -Is complaining of increased fatigue -Presents today for refill of hydrocodone, renew urine toxicology screen today.   Pharmacotherapy Assessment   Analgesic: Norco 7.5 mg 3 times daily as needed, quantity 90/month; MME equals 22.5     Monitoring: [place] PMP: PDMP reviewed during this encounter.       Pharmacotherapy: No side-effects or adverse reactions reported. Compliance: No problems identified. Effectiveness: Clinically acceptable.  Rise Patience, RN  10/09/2021  2:23 PM  Sign when Signing Visit Nursing Pain Medication Assessment:  Safety precautions to be maintained throughout the outpatient stay will include: orient to surroundings, keep bed in low position, maintain call bell within reach at all times, provide assistance with transfer out of bed and ambulation.  Medication Inspection Compliance: Pill count conducted under aseptic conditions, in front of the patient. Neither the pills nor the bottle was removed from the patient's sight at any time. Once count was completed pills were immediately returned to the patient in their original bottle.  Medication: Hydrocodone/APAP Pill/Patch Count:  0 of 90 pills remain Pill/Patch Appearance: Markings consistent with prescribed medication Bottle Appearance: Standard pharmacy container. Clearly labeled. Filled Date: 17 / 08 / 2022 Last Medication intake:  Today      UDS:  Summary  Date Value Ref Range Status  11/21/2020 Note  Final    Comment:    ==================================================================== Compliance Drug Analysis, Ur ==================================================================== Test  Result       Flag       Units  Drug Present and Declared for Prescription Verification   Alprazolam                     137          EXPECTED    ng/mg creat   Alpha-hydroxyalprazolam        123          EXPECTED   ng/mg creat    Source of alprazolam is a scheduled prescription medication. Alpha-    hydroxyalprazolam is an expected metabolite of alprazolam.    Hydrocodone                    1206         EXPECTED   ng/mg creat   Hydromorphone                  540          EXPECTED   ng/mg creat   Dihydrocodeine                 246          EXPECTED   ng/mg creat   Norhydrocodone                 1460         EXPECTED   ng/mg creat    Sources of hydrocodone include scheduled prescription medications.    Hydromorphone, dihydrocodeine and norhydrocodone are expected    metabolites of hydrocodone. Hydromorphone and dihydrocodeine are    also available as scheduled prescription medications.    Venlafaxine                    PRESENT      EXPECTED   Desmethylvenlafaxine           PRESENT      EXPECTED    Desmethylvenlafaxine is an expected metabolite of venlafaxine.    Acetaminophen                  PRESENT      EXPECTED  Drug Present not Declared for Prescription Verification   Gabapentin                     PRESENT      UNEXPECTED  Drug Absent but Declared for Prescription Verification   Diphenhydramine                Not Detected UNEXPECTED ==================================================================== Test                      Result    Flag   Units      Ref Range   Creatinine              35               mg/dL      >=20 ==================================================================== Declared Medications:  The flagging and interpretation on this report are based on the  following declared medications.  Unexpected results may arise from  inaccuracies in the declared medications.   **Note: The testing scope of this panel includes these medications:   Alprazolam  Diphenhydramine (Sominex)  Hydrocodone  Venlafaxine   **Note: The testing scope of this panel does not include small to  moderate amounts of these  reported medications:   Acetaminophen   **Note: The testing  scope of this panel does not include the  following reported medications:   Abatacept  Aluminum Hydroxide (Maalox)  Atropine (Lomotil)  Cetirizine (Zyrtec)  Diphenoxylate (Lomotil)  Docusate (Colace)  Estradiol (Estrace)  Eye Drop  Fluticasone  Hydroxychloroquine  Levothyroxine  Magnesium Hydroxide (Maalox)  Nystatin  Polyethylene Glycol  Solifenacin  Vitamin D2 ==================================================================== For clinical consultation, please call [phone]. ====================================================================      ROS  Constitutional: Denies any fever or chills Gastrointestinal: No reported hemesis, hematochezia, vomiting, or acute GI distress Musculoskeletal:  bilateral shoulder and arm pain, significant improvement after bilateral glenohumeral joint injection Neurological: No reported episodes of acute onset apraxia, aphasia, dysarthria, agnosia, amnesia, paralysis, loss of coordination, or loss of consciousness  Medication Review  ALPRAZolam, Abatacept, HYDROcodone-acetaminophen, Metoprolol Succinate, Polyethyl Glycol-Propyl Glycol, aluminum-magnesium hydroxide-simethicone, cetirizine, cycloSPORINE, denosumab, diphenhydrAMINE, diphenoxylate-atropine, estradiol, fluticasone, furosemide, hydroxychloroquine, levothyroxine, nystatin, torsemide, and venlafaxine XR  History Review  Allergy: [name] is allergic to rifampin, sulfa antibiotics, nsaids, and sulfur. Drug: [name]  reports no history of drug use. Alcohol:  reports no history of alcohol use. Tobacco:  reports that she has never smoked. She has never used smokeless tobacco. Social: [name]  reports that she has never smoked. She has never used smokeless tobacco. She reports that she does not drink alcohol and does not use drugs. Medical:  has a past medical history of Arthritis, CHF (congestive heart failure)  ([place]), History of hiatal hernia, Hypothyroidism, Lupus ([place]), Rheumatoid arthritis ([place]), and Rosacea. Surgical: [name]  has a past surgical history that includes Joint replacement (Left); Cholesteatoma excision (Right); vericose; Colonoscopy; Breast enhancement surgery; Cholecystectomy; Hernia repair; Tubal ligation; Total laparoscopic hysterectomy with bilateral salpingo oophorectomy (Bilateral, 10/24/2020); and Cystocele repair (N/A, 10/24/2020). Family: family history is not on file.  Laboratory Chemistry Profile   Renal Lab Results  Component Value Date   BUN 16 10/25/2020   CREATININE 0.87 10/25/2020   GFRNONAA >60 10/25/2020     Hepatic Lab Results  Component Value Date   AST 31 12/17/2020   ALT 22 12/17/2020   ALBUMIN 4.2 12/17/2020   ALKPHOS 71 12/17/2020   LIPASE 28 09/06/2020     Electrolytes Lab Results  Component Value Date   NA 136 10/25/2020   K 4.2 10/25/2020   CL 100 10/25/2020   CALCIUM 8.3 (L) 10/25/2020     Bone No results found for: VD25OH, VD125OH2TOT, BH4193XT0, WI0973ZH2, 25OHVITD1, 25OHVITD2, 25OHVITD3, TESTOFREE, TESTOSTERONE   Inflammation (CRP: Acute Phase) (ESR: Chronic Phase) No results found for: CRP, ESRSEDRATE, LATICACIDVEN     Note: Above Lab results reviewed.   Physical Exam  General appearance: Well nourished, well developed, and well hydrated. In no apparent acute distress Mental status: Alert, oriented x 3 (person, place, & time)       Respiratory: No evidence of acute respiratory distress Eyes: PERLA Vitals: BP 114/69   Pulse 65   Temp 97.7 F (36.5 C) (Temporal)   Resp 16   Ht $R'5\' 2"'wh$  (1.575 m)   Wt 220 lb (99.8 kg)   SpO2 100%   BMI 40.24 kg/m  BMI: Estimated body mass index is 40.24 kg/m as calculated from the following:   Height as of this encounter: $RemoveBeforeD'5\' 2"'PrDNZOBHBrpBIN$  (1.575 m).   Weight as of this encounter: 220 lb (99.8 kg). Ideal: Ideal body weight: 50.1 kg (110 lb 7.2 oz) Adjusted ideal body weight: 70 kg (154 lb 4.3 oz)    Cervical Spine Area Exam  Skin & Axial Inspection: No masses, redness,  edema, swelling, or associated skin lesions Alignment: Symmetrical Functional ROM: Pain restricted ROM, bilaterally, improved after injection Stability: No instability detected Muscle Tone/Strength: Functionally intact. No obvious neuro-muscular anomalies detected. Sensory (Neurological): Musculoskeletal pain pattern improved after injection Palpation: No palpable anomalies             Upper Extremity (UE) Exam    Side: Right upper extremity  Side: Left upper extremity  Skin & Extremity Inspection: Skin color, temperature, and hair growth are WNL. No peripheral edema or cyanosis. No masses, redness, swelling, asymmetry, or associated skin lesions. No contractures.  Skin & Extremity Inspection: Skin color, temperature, and hair growth are WNL. No peripheral edema or cyanosis. No masses, redness, swelling, asymmetry, or associated skin lesions. No contractures.  Functional ROM: Pain restricted ROM for shoulder and elbow  Functional ROM: Pain restricted ROM for shoulder and elbow  Muscle Tone/Strength: Functionally intact. No obvious neuro-muscular anomalies detected.  Muscle Tone/Strength: Functionally intact. No obvious neuro-muscular anomalies detected.  Sensory (Neurological): Arthropathic arthralgia          Sensory (Neurological): Arthropathic arthralgia          Palpation: No palpable anomalies              Palpation: No palpable anomalies              Provocative Test(s):  Phalen's test: deferred Tinel's test: deferred Apley's scratch test (touch opposite shoulder):  Action 1 (Across chest): Improved ROM Action 2 (Overhead): Improved ROM Action 3 (LB reach): Improved ROM   Provocative Test(s):  Phalen's test: deferred Tinel's test: deferred Apley's scratch test (touch opposite shoulder):  Action 1 (Across chest): Improved ROM Action 2 (Overhead): Improved ROM Action 3 (LB reach): Improved ROM    Lumbar Exam   Skin & Axial Inspection: No masses, redness, or swelling Alignment: Symmetrical Functional ROM: Unrestricted ROM       Stability: No instability detected Muscle Tone/Strength: Functionally intact. No obvious neuro-muscular anomalies detected. Sensory (Neurological): Musculoskeletal pain pattern     Gait & Posture Assessment  Ambulation: Unassisted Gait: Relatively normal for age and body habitus Posture: WNL    Lower Extremity Exam      Side: Right lower extremity   Side: Left lower extremity  Stability: No instability observed           Stability: No instability observed          Skin & Extremity Inspection: Skin color, temperature, and hair growth are WNL. No peripheral edema or cyanosis. No masses, redness, swelling, asymmetry, or associated skin lesions. No contractures.   Skin & Extremity Inspection: Evidence of prior arthroplastic surgery  Functional ROM: Pain restricted ROM for hip and knee joints           Functional ROM: Unrestricted ROM                  Muscle Tone/Strength: Functionally intact. No obvious neuro-muscular anomalies detected.   Muscle Tone/Strength: Functionally intact. No obvious neuro-muscular anomalies detected.  Sensory (Neurological): Unimpaired         Sensory (Neurological): Arthropathic arthralgia knee        DTR: Patellar: deferred today Achilles: deferred today Plantar: deferred today   DTR: Patellar: deferred today Achilles: deferred today Plantar: deferred today  Palpation: No palpable anomalies   Palpation: No palpable anomalies      Assessment   Status Diagnosis  Controlled Controlled Persistent 1. Primary osteoarthritis of both shoulders   2. Chronic pain of both shoulders  3. Rheumatoid arthritis involving multiple sites, unspecified whether rheumatoid factor present ([place])   4. History of left hip replacement   5. Pain management contract signed   6. Polyarthralgia   7. H/O right knee surgery   8. Chronic pain syndrome          Plan of Care   [name] has a current medication list which includes the following long-term medication(s): abatacept, cetirizine, diphenhydramine, estradiol, fluticasone, furosemide, levothyroxine, metoprolol succinate, torsemide, and venlafaxine xr.  Pharmacotherapy (Medications Ordered): Meds ordered this encounter  Medications   HYDROcodone-acetaminophen (NORCO) 7.5-325 MG tablet    Sig: Take 1 tablet by mouth 3 (three) times daily as needed for severe pain. Must last 30 days    Dispense:  90 tablet    Refill:  0    Chronic Pain: STOP Act (Not applicable) Fill 1 day early if closed on refill date. Avoid benzodiazepines within 8 hours of opioids   HYDROcodone-acetaminophen (NORCO) 7.5-325 MG tablet    Sig: Take 1 tablet by mouth 3 (three) times daily as needed for severe pain. Must last 30 days    Dispense:  90 tablet    Refill:  0    Chronic Pain: STOP Act (Not applicable) Fill 1 day early if closed on refill date. Avoid benzodiazepines within 8 hours of opioids   HYDROcodone-acetaminophen (NORCO) 7.5-325 MG tablet    Sig: Take 1 tablet by mouth 3 (three) times daily as needed for severe pain. Must last 30 days    Dispense:  90 tablet    Refill:  0    Chronic Pain: STOP Act (Not applicable) Fill 1 day early if closed on refill date. Avoid benzodiazepines within 8 hours of opioids   Follow-up as needed for repeat shoulder, glenohumeral joint injection.  Follow-up plan:   Return in about 3 months (around 01/21/2022) for Medication Management, in person.    Recent Visits Date Type Provider Dept  08/14/21 Office Visit Gillis Santa, MD [facility]  Showing recent visits within past 90 days and meeting all other requirements Today's Visits Date Type Provider Dept  10/09/21 Office Visit Gillis Santa, MD [facility]  Showing today's visits and meeting all other requirements Future Appointments No visits were found meeting these conditions. Showing  future appointments within next 90 days and meeting all other requirements I discussed the assessment and treatment plan with the patient. The patient was provided an opportunity to ask questions and all were answered. The patient agreed with the plan and demonstrated an understanding of the instructions.  Patient advised to call back or seek an in-person evaluation if the symptoms or condition worsens.  Duration of encounter: 30 minutes.  Note by: Gillis Santa, MD Date: 10/09/2021; Time: 3:00 PM

## 2021-10-09 NOTE — Progress Notes (Signed)
Nursing Pain Medication Assessment:  Safety precautions to be maintained throughout the outpatient stay will include: orient to surroundings, keep bed in low position, maintain call bell within reach at all times, provide assistance with transfer out of bed and ambulation.  Medication Inspection Compliance: Pill count conducted under aseptic conditions, in front of the patient. Neither the pills nor the bottle was removed from the patient's sight at any time. Once count was completed pills were immediately returned to the patient in their original bottle.  Medication: Hydrocodone/APAP Pill/Patch Count:  0 of 90 pills remain Pill/Patch Appearance: Markings consistent with prescribed medication Bottle Appearance: Standard pharmacy container. Clearly labeled. Filled Date: 10 / 08 / 2022 Last Medication intake:  Today

## 2021-10-16 LAB — TOXASSURE SELECT 13 (MW), URINE

## 2021-12-30 ENCOUNTER — Encounter: Payer: Self-pay | Admitting: Student in an Organized Health Care Education/Training Program

## 2021-12-30 ENCOUNTER — Other Ambulatory Visit: Payer: Self-pay

## 2021-12-30 ENCOUNTER — Ambulatory Visit
Payer: Medicare Other | Attending: Student in an Organized Health Care Education/Training Program | Admitting: Student in an Organized Health Care Education/Training Program

## 2021-12-30 VITALS — BP 138/69 | HR 110 | Temp 98.1°F | Resp 18 | Ht 62.0 in | Wt 224.0 lb

## 2021-12-30 DIAGNOSIS — M19012 Primary osteoarthritis, left shoulder: Secondary | ICD-10-CM | POA: Diagnosis present

## 2021-12-30 DIAGNOSIS — M19011 Primary osteoarthritis, right shoulder: Secondary | ICD-10-CM | POA: Diagnosis present

## 2021-12-30 DIAGNOSIS — G894 Chronic pain syndrome: Secondary | ICD-10-CM | POA: Insufficient documentation

## 2021-12-30 DIAGNOSIS — M25511 Pain in right shoulder: Secondary | ICD-10-CM | POA: Diagnosis present

## 2021-12-30 DIAGNOSIS — G8929 Other chronic pain: Secondary | ICD-10-CM | POA: Diagnosis present

## 2021-12-30 DIAGNOSIS — M069 Rheumatoid arthritis, unspecified: Secondary | ICD-10-CM | POA: Diagnosis present

## 2021-12-30 DIAGNOSIS — M25512 Pain in left shoulder: Secondary | ICD-10-CM | POA: Diagnosis present

## 2021-12-30 DIAGNOSIS — Z0289 Encounter for other administrative examinations: Secondary | ICD-10-CM | POA: Insufficient documentation

## 2021-12-30 MED ORDER — HYDROCODONE-ACETAMINOPHEN 7.5-325 MG PO TABS: 1.0000 | ORAL_TABLET | Freq: Three times a day (TID) | ORAL | 0 refills | Status: DC | PRN

## 2021-12-30 MED ORDER — HYDROCODONE-ACETAMINOPHEN 7.5-325 MG PO TABS: 1.0000 | ORAL_TABLET | Freq: Three times a day (TID) | ORAL | 0 refills | Status: AC | PRN

## 2021-12-30 NOTE — Progress Notes (Signed)
PROVIDER NOTE: Information contained herein reflects review and annotations entered in association with encounter. Interpretation of such information and data should be left to medically-trained personnel. Information provided to patient can be located elsewhere in the medical record under "Patient Instructions". Document created using STT-dictation technology, any transcriptional errors that may result from process are unintentional.    Patient: Candace Juarez  Service Category: E/M  Provider: Gillis Santa, MD  DOB: 1951/11/03  DOS: 12/30/2021  Specialty: Interventional Pain Management  MRN: [id number]  Setting: Ambulatory outpatient  PCP: Candace Lighter, MD  Type: Established Patient    Referring Provider: Gladstone Lighter, MD  Location: Office  Delivery: Face-to-face     HPI  [name], a 71 y.o. year old female, is here today because of her Chronic pain syndrome [G89.4]. [name] primary complain today is Knee Pain (right), Shoulder Pain (bialteral), Hand Pain, and Back Pain (lower)  Last encounter: My last encounter with her was on 10/09/21  Pertinent problems: [name] has Polyarthralgia; History of left hip replacement; H/O right knee surgery; Chronic pain of both knees; Chronic pain of both hips; Chronic pain syndrome; and Pain management contract signed on their pertinent problem list. Pain Assessment: Severity of Chronic pain is reported as a 4 /10. Location: Knee Right/denies. Onset: More than a month ago. Quality: Sharp (catching). Timing: Intermittent. Modifying factor(s): sitting, elevating, ice, heat, diclofenac cream. Vitals:  height is _0  (1.575 m) and weight is 224 lb (101.6 kg). Her temperature is 98.1 F (36.7 C). Her blood pressure is 138/69 and her pulse is 110 (abnormal). Her respiration is 18 and oxygen saturation is 97%.   Reason for encounter: medication management.     -doing well from a cardiac standpoint, no resting SOB -Has Lupus, RFA, no recent  flares. -Presents today for refill of hydrocodone   Pharmacotherapy Assessment   Analgesic: Norco 7.5 mg 3 times daily as needed, quantity 90/month; MME equals 22.5     Monitoring:  PMP: PDMP reviewed during this encounter.       Pharmacotherapy: No side-effects or adverse reactions reported. Compliance: No problems identified. Effectiveness: Clinically acceptable.  Candace Shorter, RN  12/30/2021  2:17 PM  Sign when Signing Visit Nursing Pain Medication Assessment:  Safety precautions to be maintained throughout the outpatient stay will include: orient to surroundings, keep bed in low position, maintain call bell within reach at all times, provide assistance with transfer out of bed and ambulation.  Medication Inspection Compliance: Pill count conducted under aseptic conditions, in front of the patient. Neither the pills nor the bottle was removed from the patient's sight at any time. Once count was completed pills were immediately returned to the patient in their original bottle.  Medication: Hydrocodone/APAP Pill/Patch Count:  75 of 90 pills remain Pill/Patch Appearance: Markings consistent with prescribed medication Bottle Appearance: Standard pharmacy container. Clearly labeled. Filled Date: 02 / 15 / 2023 Last Medication intake:  Today     UDS:  Summary  Date Value Ref Range Status  10/09/2021 Note  Final    Comment:    ==================================================================== [facility] (MW) ==================================================================== Test                             Result       Flag       Units  Drug Present and Declared for Prescription Verification   Alprazolam  57           EXPECTED   ng/mg creat   Alpha-hydroxyalprazolam        49           EXPECTED   ng/mg creat    Source of alprazolam is a scheduled prescription medication. Alpha-    hydroxyalprazolam is an expected metabolite of alprazolam.     Hydrocodone                    2406         EXPECTED   ng/mg creat   Hydromorphone                  329          EXPECTED   ng/mg creat   Dihydrocodeine                 187          EXPECTED   ng/mg creat   Norhydrocodone                 2395         EXPECTED   ng/mg creat    Sources of hydrocodone include scheduled prescription medications.    Hydromorphone, dihydrocodeine and norhydrocodone are expected    metabolites of hydrocodone. Hydromorphone and dihydrocodeine are    also available as scheduled prescription medications.  ==================================================================== Test                      Result    Flag   Units      Ref Range   Creatinine              130              mg/dL      >=20 ==================================================================== Declared Medications:  The flagging and interpretation on this report are based on the  following declared medications.  Unexpected results may arise from  inaccuracies in the declared medications.   **Note: The testing scope of this panel includes these medications:   Alprazolam (Xanax)  Hydrocodone (Norco)   **Note: The testing scope of this panel does not include the  following reported medications:   Abatacept  Acetaminophen (Norco)  Aluminum Hydroxide (Maalox)  Atropine (Lomotil)  Cetirizine (Zyrtec)  Denosumab (Prolia)  Diphenhydramine (Sominex)  Diphenoxylate (Lomotil)  Estradiol (Estrace)  Eye Drop  Fluticasone (Flonase)  Furosemide (Lasix)  Hydroxychloroquine (Plaquenil)  Levothyroxine (Synthroid)  Magnesium Hydroxide (Maalox)  Metoprolol  Nystatin (Mycostatin)  Polyethylene Glycol  Torsemide (Demadex)  Venlafaxine (Effexor) ==================================================================== For clinical consultation, please call [phone]. ====================================================================      ROS  Constitutional: Denies any fever or  chills Gastrointestinal: No reported hemesis, hematochezia, vomiting, or acute GI distress Musculoskeletal:  bilateral shoulder  pain Neurological: No reported episodes of acute onset apraxia, aphasia, dysarthria, agnosia, amnesia, paralysis, loss of coordination, or loss of consciousness  Medication Review  ALPRAZolam, Abatacept, HYDROcodone-acetaminophen, Metoprolol Succinate, Polyethyl Glycol-Propyl Glycol, aluminum-magnesium hydroxide-simethicone, cetirizine, cycloSPORINE, denosumab, diphenhydrAMINE, diphenoxylate-atropine, estradiol, fluticasone, furosemide, hydroxychloroquine, levothyroxine, nystatin, torsemide, and venlafaxine XR  History Review  Allergy: [name] is allergic to rifampin, sulfa antibiotics, nsaids, and sulfur. Drug: [name]  reports no history of drug use. Alcohol:  reports no history of alcohol use. Tobacco:  reports that she has never smoked. She has never used smokeless tobacco. Social: [name]  reports that she has never smoked. She has never used smokeless tobacco. She reports  that she does not drink alcohol and does not use drugs. Medical:  has a past medical history of Arthritis, CHF (congestive heart failure) ([employer]), History of hiatal hernia, Hypothyroidism, Lupus ([place]), Rheumatoid arthritis ([place]), and Rosacea. Surgical: [name]  has a past surgical history that includes Joint replacement (Left); Cholesteatoma excision (Right); vericose; Colonoscopy; Breast enhancement surgery; Cholecystectomy; Hernia repair; Tubal ligation; Total laparoscopic hysterectomy with bilateral salpingo oophorectomy (Bilateral, 10/24/2020); and Cystocele repair (N/A, 10/24/2020). Family: family history is not on file.  Laboratory Chemistry Profile   Renal Lab Results  Component Value Date   BUN 16 10/25/2020   CREATININE 0.87 10/25/2020   GFRNONAA >60 10/25/2020     Hepatic Lab Results  Component Value Date   AST 31 12/17/2020   ALT 22 12/17/2020   ALBUMIN 4.2 12/17/2020    ALKPHOS 71 12/17/2020   LIPASE 28 09/06/2020     Electrolytes Lab Results  Component Value Date   NA 136 10/25/2020   K 4.2 10/25/2020   CL 100 10/25/2020   CALCIUM 8.3 (L) 10/25/2020     Bone No results found for: VD25OH, VD125OH2TOT, [id number], GY1749SW9, 25OHVITD1, 25OHVITD2, 25OHVITD3, TESTOFREE, TESTOSTERONE   Inflammation (CRP: Acute Phase) (ESR: Chronic Phase) No results found for: CRP, ESRSEDRATE, LATICACIDVEN     Note: Above Lab results reviewed.   Physical Exam  General appearance: Well nourished, well developed, and well hydrated. In no apparent acute distress Mental status: Alert, oriented x 3 (person, place, & time)       Respiratory: No evidence of acute respiratory distress Eyes: PERLA Vitals: BP 138/69 (BP Location: Left Arm, Patient Position: Sitting, Cuff Size: Normal)    Pulse (!) 110    Temp 98.1 F (36.7 C)    Resp 18    Ht _0  (1.575 m)    Wt 224 lb (101.6 kg)    SpO2 97%    BMI 40.97 kg/m  BMI: Estimated body mass index is 40.97 kg/m as calculated from the following:   Height as of this encounter: _1  (1.575 m).   Weight as of this encounter: 224 lb (101.6 kg). Ideal: Ideal body weight: 50.1 kg (110 lb 7.2 oz) Adjusted ideal body weight: 70.7 kg (155 lb 13.9 oz)   Cervical Spine Area Exam  Skin & Axial Inspection: No masses, redness, edema, swelling, or associated skin lesions Alignment: Symmetrical Functional ROM: Pain restricted ROM, bilaterally,  Stability: No instability detected Muscle Tone/Strength: Functionally intact. No obvious neuro-muscular anomalies detected. Sensory (Neurological): Musculoskeletal pain pattern  Palpation: No palpable anomalies             Upper Extremity (UE) Exam    Side: Right upper extremity  Side: Left upper extremity  Skin & Extremity Inspection: Skin color, temperature, and hair growth are WNL. No peripheral edema or cyanosis. No masses, redness, swelling, asymmetry, or associated skin lesions. No  contractures.  Skin & Extremity Inspection: Skin color, temperature, and hair growth are WNL. No peripheral edema or cyanosis. No masses, redness, swelling, asymmetry, or associated skin lesions. No contractures.  Functional ROM: Pain restricted ROM for shoulder and elbow  Functional ROM: Pain restricted ROM for shoulder and elbow  Muscle Tone/Strength: Functionally intact. No obvious neuro-muscular anomalies detected.  Muscle Tone/Strength: Functionally intact. No obvious neuro-muscular anomalies detected.  Sensory (Neurological): Arthropathic arthralgia          Sensory (Neurological): Arthropathic arthralgia          Palpation: No palpable anomalies  Palpation: No palpable anomalies              Provocative Test(s):  Phalen's test: deferred Tinel's test: deferred Apley's scratch test (touch opposite shoulder):  Action 1 (Across chest): Improved ROM Action 2 (Overhead): Improved ROM Action 3 (LB reach): Improved ROM   Provocative Test(s):  Phalen's test: deferred Tinel's test: deferred Apley's scratch test (touch opposite shoulder):  Action 1 (Across chest): Improved ROM Action 2 (Overhead): Improved ROM Action 3 (LB reach): Improved ROM    Lumbar Exam  Skin & Axial Inspection: No masses, redness, or swelling Alignment: Symmetrical Functional ROM: Unrestricted ROM       Stability: No instability detected Muscle Tone/Strength: Functionally intact. No obvious neuro-muscular anomalies detected. Sensory (Neurological): Musculoskeletal pain pattern     Gait & Posture Assessment  Ambulation: Unassisted Gait: Relatively normal for age and body habitus Posture: WNL    Lower Extremity Exam      Side: Right lower extremity   Side: Left lower extremity  Stability: No instability observed           Stability: No instability observed          Skin & Extremity Inspection: Skin color, temperature, and hair growth are WNL. No peripheral edema or cyanosis. No masses, redness,  swelling, asymmetry, or associated skin lesions. No contractures.   Skin & Extremity Inspection: Evidence of prior arthroplastic surgery  Functional ROM: Pain restricted ROM for hip and knee joints           Functional ROM: Unrestricted ROM                  Muscle Tone/Strength: Functionally intact. No obvious neuro-muscular anomalies detected.   Muscle Tone/Strength: Functionally intact. No obvious neuro-muscular anomalies detected.  Sensory (Neurological): Unimpaired         Sensory (Neurological): Arthropathic arthralgia knee        DTR: Patellar: deferred today Achilles: deferred today Plantar: deferred today   DTR: Patellar: deferred today Achilles: deferred today Plantar: deferred today  Palpation: No palpable anomalies   Palpation: No palpable anomalies      Assessment   Status Diagnosis  Controlled Controlled Controlled 1. Chronic pain syndrome   2. Rheumatoid arthritis involving multiple sites, unspecified whether rheumatoid factor present ([place])   3. Primary osteoarthritis of both shoulders   4. Chronic pain of both shoulders   5. Pain management contract signed          Plan of Care   [name] has a current medication list which includes the following long-term medication(s): abatacept, cetirizine, estradiol, fluticasone, furosemide, levothyroxine, metoprolol succinate, torsemide, venlafaxine xr, and diphenhydramine.  Pharmacotherapy (Medications Ordered): Meds ordered this encounter  Medications   HYDROcodone-acetaminophen (NORCO) 7.5-325 MG tablet    Sig: Take 1 tablet by mouth 3 (three) times daily as needed for severe pain. Must last 30 days    Dispense:  90 tablet    Refill:  0    Chronic Pain: STOP Act (Not applicable) Fill 1 day early if closed on refill date. Avoid benzodiazepines within 8 hours of opioids   HYDROcodone-acetaminophen (NORCO) 7.5-325 MG tablet    Sig: Take 1 tablet by mouth 3 (three) times daily as needed for severe pain. Must  last 30 days    Dispense:  90 tablet    Refill:  0    Chronic Pain: STOP Act (Not applicable) Fill 1 day early if closed on refill date. Avoid benzodiazepines within 8 hours of opioids  HYDROcodone-acetaminophen (NORCO) 7.5-325 MG tablet    Sig: Take 1 tablet by mouth 3 (three) times daily as needed for severe pain. Must last 30 days    Dispense:  90 tablet    Refill:  0    Chronic Pain: STOP Act (Not applicable) Fill 1 day early if closed on refill date. Avoid benzodiazepines within 8 hours of opioids   Follow-up as needed for repeat shoulder, glenohumeral joint injection.  Follow-up plan:   Return in about 4 months (around 04/16/2022) for Medication Management, in person.    Recent Visits Date Type Provider Dept  10/09/21 Office Visit Candace Santa, MD [facility]  Showing recent visits within past 90 days and meeting all other requirements Today's Visits Date Type Provider Dept  12/30/21 Office Visit Candace Santa, MD [facility]  Showing today's visits and meeting all other requirements Future Appointments No visits were found meeting these conditions. Showing future appointments within next 90 days and meeting all other requirements  I discussed the assessment and treatment plan with the patient. The patient was provided an opportunity to ask questions and all were answered. The patient agreed with the plan and demonstrated an understanding of the instructions.  Patient advised to call back or seek an in-person evaluation if the symptoms or condition worsens.  Duration of encounter: 30 minutes.  Note by: Candace Santa, MD Date: 12/30/2021; Time: 2:27 PM

## 2021-12-30 NOTE — Progress Notes (Signed)
Nursing Pain Medication Assessment:  Safety precautions to be maintained throughout the outpatient stay will include: orient to surroundings, keep bed in low position, maintain call bell within reach at all times, provide assistance with transfer out of bed and ambulation.  Medication Inspection Compliance: Pill count conducted under aseptic conditions, in front of the patient. Neither the pills nor the bottle was removed from the patient's sight at any time. Once count was completed pills were immediately returned to the patient in their original bottle.  Medication: Hydrocodone/APAP Pill/Patch Count:  75 of 90 pills remain Pill/Patch Appearance: Markings consistent with prescribed medication Bottle Appearance: Standard pharmacy container. Clearly labeled. Filled Date: 02 / 15 / 2023 Last Medication intake:  Today

## 2022-04-13 ENCOUNTER — Other Ambulatory Visit: Payer: Self-pay | Admitting: Ophthalmology

## 2022-04-13 DIAGNOSIS — G453 Amaurosis fugax: Secondary | ICD-10-CM

## 2022-04-16 ENCOUNTER — Encounter: Payer: Medicare Other | Admitting: Student in an Organized Health Care Education/Training Program

## 2022-04-21 ENCOUNTER — Ambulatory Visit (HOSPITAL_BASED_OUTPATIENT_CLINIC_OR_DEPARTMENT_OTHER): Payer: Medicare Other | Admitting: Student in an Organized Health Care Education/Training Program

## 2022-04-21 ENCOUNTER — Ambulatory Visit
Admission: RE | Admit: 2022-04-21 | Discharge: 2022-04-21 | Disposition: A | Discharge status: Home / Self Care | Payer: Medicare Other | Source: Ambulatory Visit | Attending: Ophthalmology | Admitting: Ophthalmology

## 2022-04-21 ENCOUNTER — Encounter: Payer: Self-pay | Admitting: Student in an Organized Health Care Education/Training Program

## 2022-04-21 VITALS — BP 148/62 | HR 74 | Temp 97.2°F | Resp 16 | Ht 62.0 in | Wt 233.0 lb

## 2022-04-21 DIAGNOSIS — G453 Amaurosis fugax: Secondary | ICD-10-CM | POA: Diagnosis present

## 2022-04-21 DIAGNOSIS — M19012 Primary osteoarthritis, left shoulder: Secondary | ICD-10-CM

## 2022-04-21 DIAGNOSIS — G894 Chronic pain syndrome: Secondary | ICD-10-CM | POA: Insufficient documentation

## 2022-04-21 DIAGNOSIS — M19011 Primary osteoarthritis, right shoulder: Secondary | ICD-10-CM

## 2022-04-21 DIAGNOSIS — Z0289 Encounter for other administrative examinations: Secondary | ICD-10-CM | POA: Insufficient documentation

## 2022-04-21 DIAGNOSIS — M069 Rheumatoid arthritis, unspecified: Secondary | ICD-10-CM

## 2022-04-21 MED ORDER — HYDROCODONE-ACETAMINOPHEN 7.5-325 MG PO TABS: 1.0000 | ORAL_TABLET | Freq: Three times a day (TID) | ORAL | 0 refills | Status: AC | PRN

## 2022-04-21 MED ORDER — HYDROCODONE-ACETAMINOPHEN 7.5-325 MG PO TABS: 1.0000 | ORAL_TABLET | Freq: Three times a day (TID) | ORAL | 0 refills | Status: DC | PRN

## 2022-04-21 NOTE — Progress Notes (Signed)
PROVIDER NOTE: Information contained herein reflects review and annotations entered in association with encounter. Interpretation of such information and data should be left to medically-trained personnel. Information provided to patient can be located elsewhere in the medical record under "Patient Instructions". Document created using STT-dictation technology, any transcriptional errors that may result from process are unintentional.    Patient: Candace Juarez  Service Category: E/M  Provider: Gillis Santa, MD  DOB: 10/04/1951  DOS: 04/21/2022  Specialty: Interventional Pain Management  MRN: [id number]  Setting: Ambulatory outpatient  PCP: Gladstone Lighter, MD  Type: Established Patient    Referring Provider: Gladstone Lighter, MD  Location: Office  Delivery: Face-to-face     HPI  [name], a 71 y.o. year old female, is here today because of her Chronic pain syndrome [G89.4]. [name] primary complain today is Joint Pain (generalized)  Last encounter: My last encounter with her was on 12/30/21  Pertinent problems: [name] has Polyarthralgia; History of left hip replacement; H/O right knee surgery; Chronic pain of both knees; Chronic pain of both hips; Chronic pain syndrome; and Pain management contract signed on their pertinent problem list. Pain Assessment: Severity of Chronic pain is reported as a 2 /10. Location: Other (Comment) (joint)  / . Onset: More than a month ago. Quality: Constant. Timing: Constant. Modifying factor(s): meds, heat, ice. Vitals:  height is 5\' 2"  (1.575 m) and weight is 233 lb (105.7 kg). Her temporal temperature is 97.2 F (36.2 C) (abnormal). Her blood pressure is 148/62 (abnormal) and her pulse is 74. Her respiration is 16 and oxygen saturation is 100%.   Reason for encounter: medication management.     -doing well from a cardiac standpoint, no resting SOB or chest pain -Has Lupus, continues with [place] infusion, currently experiencing a flare for which  she is on steroids for, has 3 days left, (finishing out 9 day course) -Presents today for refill of hydrocodone -mild constipation, recommended stool softener   Pharmacotherapy Assessment   Analgesic: Norco 7.5 mg 3 times daily as needed, quantity 90/month; MME equals 22.5     Monitoring: [place] PMP: PDMP reviewed during this encounter.       Pharmacotherapy: No side-effects or adverse reactions reported. Compliance: No problems identified. Effectiveness: Clinically acceptable.  Rise Patience, RN  04/21/2022  1:12 PM  Sign when Signing Visit Nursing Pain Medication Assessment:  Safety precautions to be maintained throughout the outpatient stay will include: orient to surroundings, keep bed in low position, maintain call bell within reach at all times, provide assistance with transfer out of bed and ambulation.  Medication Inspection Compliance: Pill count conducted under aseptic conditions, in front of the patient. Neither the pills nor the bottle was removed from the patient's sight at any time. Once count was completed pills were immediately returned to the patient in their original bottle.  Medication: Hydrocodone/APAP Pill/Patch Count:  16 of 90 pills remain Pill/Patch Appearance: Markings consistent with prescribed medication Bottle Appearance: Standard pharmacy container. Clearly labeled. Filled Date: 05 / 17 / 2023 Last Medication intake:  Today     UDS:  Summary  Date Value Ref Range Status  10/09/2021 Note  Final    Comment:    ==================================================================== [employer] (MW) ==================================================================== Test                             Result       Flag       Units  Drug Present and Declared for Prescription Verification   Alprazolam                     57           EXPECTED   ng/mg creat   Alpha-hydroxyalprazolam        49           EXPECTED   ng/mg creat    Source of alprazolam is a  scheduled prescription medication. Alpha-    hydroxyalprazolam is an expected metabolite of alprazolam.    Hydrocodone                    2406         EXPECTED   ng/mg creat   Hydromorphone                  329          EXPECTED   ng/mg creat   Dihydrocodeine                 187          EXPECTED   ng/mg creat   Norhydrocodone                 2395         EXPECTED   ng/mg creat    Sources of hydrocodone include scheduled prescription medications.    Hydromorphone, dihydrocodeine and norhydrocodone are expected    metabolites of hydrocodone. Hydromorphone and dihydrocodeine are    also available as scheduled prescription medications.  ==================================================================== Test                      Result    Flag   Units      Ref Range   Creatinine              130              mg/dL      >=20 ==================================================================== Declared Medications:  The flagging and interpretation on this report are based on the  following declared medications.  Unexpected results may arise from  inaccuracies in the declared medications.   **Note: The testing scope of this panel includes these medications:   Alprazolam (Xanax)  Hydrocodone (Norco)   **Note: The testing scope of this panel does not include the  following reported medications:   Abatacept  Acetaminophen (Norco)  Aluminum Hydroxide (Maalox)  Atropine (Lomotil)  Cetirizine (Zyrtec)  Denosumab (Prolia)  Diphenhydramine (Sominex)  Diphenoxylate (Lomotil)  Estradiol (Estrace)  Eye Drop  Fluticasone (Flonase)  Furosemide (Lasix)  Hydroxychloroquine (Plaquenil)  Levothyroxine (Synthroid)  Magnesium Hydroxide (Maalox)  Metoprolol  Nystatin (Mycostatin)  Polyethylene Glycol  Torsemide (Demadex)  Venlafaxine (Effexor) ==================================================================== For clinical consultation, please call [phone]. ====================================================================      ROS  Constitutional: Denies any fever or chills Gastrointestinal: No reported hemesis, hematochezia, vomiting, or acute GI distress Musculoskeletal:  bilateral shoulder  pain Neurological: No reported episodes of acute onset apraxia, aphasia, dysarthria, agnosia, amnesia, paralysis, loss of coordination, or loss of consciousness  Medication Review  ALPRAZolam, Abatacept, HYDROcodone-acetaminophen, Metoprolol Succinate, Polyethyl Glycol-Propyl Glycol, aluminum-magnesium hydroxide-simethicone, amoxicillin-clavulanate, cetirizine, cycloSPORINE, denosumab, diphenoxylate-atropine, estradiol, fluticasone, hydroxychloroquine, levothyroxine, nystatin, predniSONE, torsemide, and venlafaxine XR  History Review  Allergy: [name] is allergic to rifampin, sulfa antibiotics, nsaids, and sulfur. Drug: [name]  reports no history of drug use. Alcohol:  reports no history of alcohol use. Tobacco:  reports that she has never smoked. She has never used smokeless tobacco. Social: [name]  reports that she has never smoked. She has never used smokeless tobacco. She reports that she does not drink alcohol and does not use drugs. Medical:  has a past medical history of Arthritis, CHF (congestive heart failure) ([place]), History of hiatal hernia, Hypothyroidism, Lupus ([place]), Rheumatoid arthritis ([place]), and Rosacea. Surgical: [name]  has a past surgical history that includes Joint replacement (Left); Cholesteatoma excision (Right); vericose; Colonoscopy; Breast enhancement surgery; Cholecystectomy; Hernia repair; Tubal ligation; Total laparoscopic hysterectomy with bilateral salpingo oophorectomy (Bilateral, 10/24/2020); and Cystocele repair (N/A, 10/24/2020). Family: family history is not on file.  Laboratory Chemistry Profile   Renal Lab Results  Component Value Date   BUN 16 10/25/2020   CREATININE 0.87 10/25/2020    GFRNONAA >60 10/25/2020     Hepatic Lab Results  Component Value Date   AST 31 12/17/2020   ALT 22 12/17/2020   ALBUMIN 4.2 12/17/2020   ALKPHOS 71 12/17/2020   LIPASE 28 09/06/2020     Electrolytes Lab Results  Component Value Date   NA 136 10/25/2020   K 4.2 10/25/2020   CL 100 10/25/2020   CALCIUM 8.3 (L) 10/25/2020     Bone No results found for: "VD25OH", "VD125OH2TOT", "EH2094BS9", "GG8366QH4", "25OHVITD1", "25OHVITD2", "25OHVITD3", "TESTOFREE", "TESTOSTERONE"   Inflammation (CRP: Acute Phase) (ESR: Chronic Phase) No results found for: "CRP", "ESRSEDRATE", "LATICACIDVEN"     Note: Above Lab results reviewed.   Physical Exam  General appearance: Well nourished, well developed, and well hydrated. In no apparent acute distress Mental status: Alert, oriented x 3 (person, place, & time)       Respiratory: No evidence of acute respiratory distress Eyes: PERLA Vitals: BP (!) 148/62   Pulse 74   Temp (!) 97.2 F (36.2 C) (Temporal)   Resp 16   Ht $R'5\' 2"'fo$  (1.575 m)   Wt 233 lb (105.7 kg)   SpO2 100%   BMI 42.62 kg/m  BMI: Estimated body mass index is 42.62 kg/m as calculated from the following:   Height as of this encounter: $RemoveBeforeD'5\' 2"'[id number]$  (1.575 m).   Weight as of this encounter: 233 lb (105.7 kg). Ideal: Ideal body weight: 50.1 kg (110 lb 7.2 oz) Adjusted ideal body weight: 72.3 kg (159 lb 7.5 oz)   Cervical Spine Area Exam  Skin & Axial Inspection: No masses, redness, edema, swelling, or associated skin lesions Alignment: Symmetrical Functional ROM: Pain restricted ROM, bilaterally,  Stability: No instability detected Muscle Tone/Strength: Functionally intact. No obvious neuro-muscular anomalies detected. Sensory (Neurological): Musculoskeletal pain pattern  Palpation: No palpable anomalies              Lumbar Exam  Skin & Axial Inspection: No masses, redness, or swelling Alignment: Symmetrical Functional ROM: Unrestricted ROM       Stability: No instability  detected Muscle Tone/Strength: Functionally intact. No obvious neuro-muscular anomalies detected. Sensory (Neurological): Musculoskeletal pain pattern     Gait & Posture Assessment  Ambulation: Unassisted Gait: Relatively normal for age and body habitus Posture: WNL    Lower Extremity Exam      Side: Right lower extremity   Side: Left lower extremity  Stability: No instability observed           Stability: No instability observed          Skin & Extremity Inspection: Skin color, temperature, and hair growth are WNL. No peripheral edema or cyanosis. No masses, redness, swelling, asymmetry, or  associated skin lesions. No contractures.   Skin & Extremity Inspection: Evidence of prior arthroplastic surgery  Functional ROM: Pain restricted ROM for hip and knee joints           Functional ROM: Unrestricted ROM                  Muscle Tone/Strength: Functionally intact. No obvious neuro-muscular anomalies detected.   Muscle Tone/Strength: Functionally intact. No obvious neuro-muscular anomalies detected.  Sensory (Neurological): Unimpaired         Sensory (Neurological): Arthropathic arthralgia knee        DTR: Patellar: deferred today Achilles: deferred today Plantar: deferred today   DTR: Patellar: deferred today Achilles: deferred today Plantar: deferred today  Palpation: No palpable anomalies   Palpation: No palpable anomalies      Assessment   Status Diagnosis  Controlled Controlled Controlled 1. Chronic pain syndrome   2. Rheumatoid arthritis involving multiple sites, unspecified whether rheumatoid factor present ([place])   3. Primary osteoarthritis of both shoulders   4. Pain management contract signed          Plan of Care   [name] has a current medication list which includes the following long-term medication(s): abatacept, cetirizine, estradiol, fluticasone, levothyroxine, metoprolol succinate, torsemide, and venlafaxine xr.  Pharmacotherapy (Medications  Ordered): Meds ordered this encounter  Medications   HYDROcodone-acetaminophen (NORCO) 7.5-325 MG tablet    Sig: Take 1 tablet by mouth 3 (three) times daily as needed for severe pain. Must last 30 days    Dispense:  90 tablet    Refill:  0    Chronic Pain: STOP Act (Not applicable) Fill 1 day early if closed on refill date. Avoid benzodiazepines within 8 hours of opioids   HYDROcodone-acetaminophen (NORCO) 7.5-325 MG tablet    Sig: Take 1 tablet by mouth 3 (three) times daily as needed for severe pain. Must last 30 days    Dispense:  90 tablet    Refill:  0    Chronic Pain: STOP Act (Not applicable) Fill 1 day early if closed on refill date. Avoid benzodiazepines within 8 hours of opioids   HYDROcodone-acetaminophen (NORCO) 7.5-325 MG tablet    Sig: Take 1 tablet by mouth 3 (three) times daily as needed for severe pain. Must last 30 days    Dispense:  90 tablet    Refill:  0    Chronic Pain: STOP Act (Not applicable) Fill 1 day early if closed on refill date. Avoid benzodiazepines within 8 hours of opioids   Follow-up as needed for repeat shoulder, glenohumeral joint injection.  Follow-up plan:   Return in about 3 months (around 07/22/2022) for Medication Management, in person.    Recent Visits No visits were found meeting these conditions. Showing recent visits within past 90 days and meeting all other requirements Today's Visits Date Type Provider Dept  04/21/22 Office Visit Gillis Santa, MD [facility]  Showing today's visits and meeting all other requirements Future Appointments No visits were found meeting these conditions. Showing future appointments within next 90 days and meeting all other requirements  I discussed the assessment and treatment plan with the patient. The patient was provided an opportunity to ask questions and all were answered. The patient agreed with the plan and demonstrated an understanding of the instructions.  Patient advised to call back  or seek an in-person evaluation if the symptoms or condition worsens.  Duration of encounter: 30 minutes.  Note by: Gillis Santa, MD Date: 04/21/2022;  Time: 1:53 PM

## 2022-04-21 NOTE — Progress Notes (Signed)
Nursing Pain Medication Assessment:  Safety precautions to be maintained throughout the outpatient stay will include: orient to surroundings, keep bed in low position, maintain call bell within reach at all times, provide assistance with transfer out of bed and ambulation.  Medication Inspection Compliance: Pill count conducted under aseptic conditions, in front of the patient. Neither the pills nor the bottle was removed from the patient's sight at any time. Once count was completed pills were immediately returned to the patient in their original bottle.  Medication: Hydrocodone/APAP Pill/Patch Count:  16 of 90 pills remain Pill/Patch Appearance: Markings consistent with prescribed medication Bottle Appearance: Standard pharmacy container. Clearly labeled. Filled Date: 05 / 17 / 2023 Last Medication intake:  Today

## 2022-07-12 IMAGING — US US PELVIS COMPLETE
1 series · 13 of 25 positions shown · non-contrast
Comparison: CT abdomen pelvis dated 09/06/2020.

CLINICAL DATA: 68-year-old female with vaginal bleeding.

EXAM:
TRANSABDOMINAL ULTRASOUND OF PELVIS
TECHNIQUE: Transabdominal ultrasound examination of the pelvis was performed
including evaluation of the uterus, ovaries, adnexal regions, and
pelvic cul-de-sac.

[Series 1: us pelvis (transabdominal only) · 56 acquisitions, 13 frames shown]
[im 1/56]
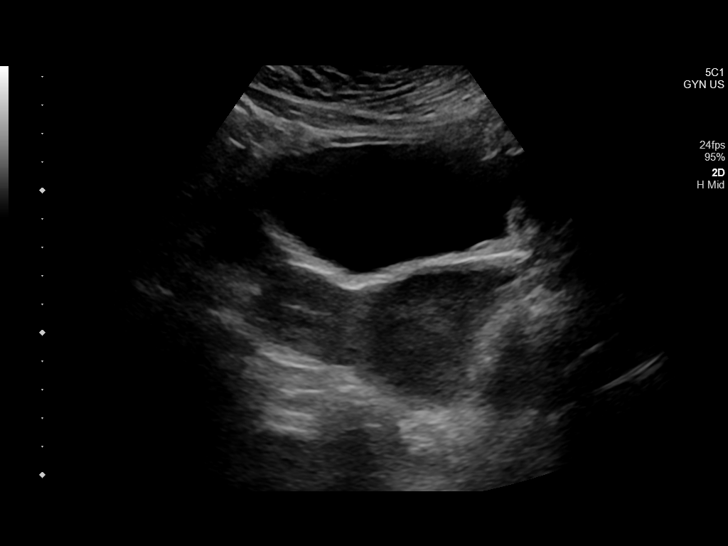
[im 5/56]
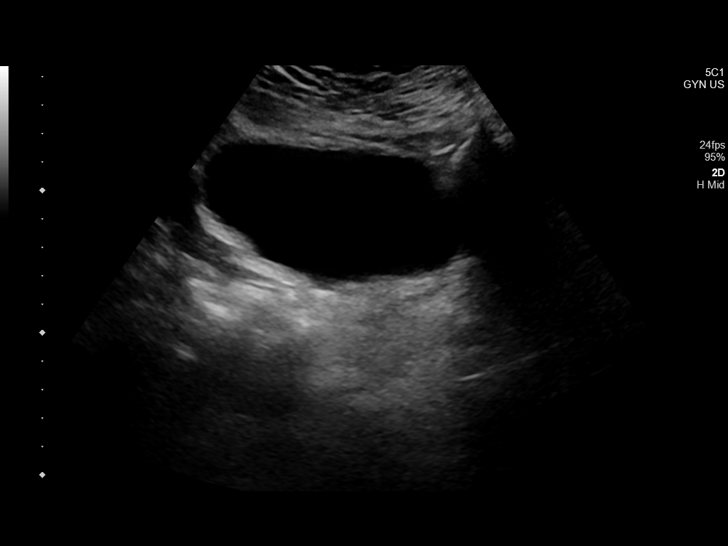
[im 10/56]
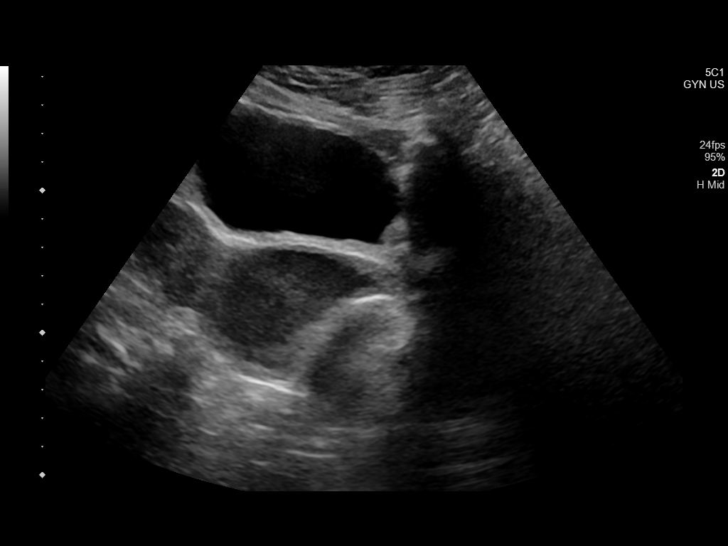
[im 14/56]
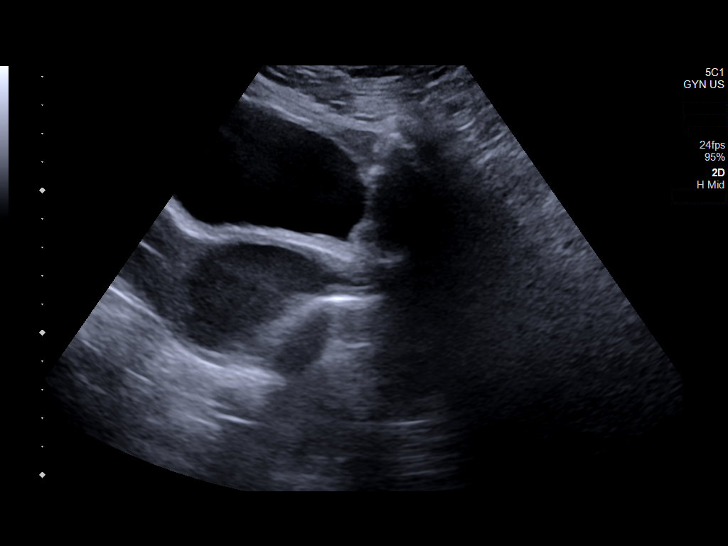
[im 19/56]
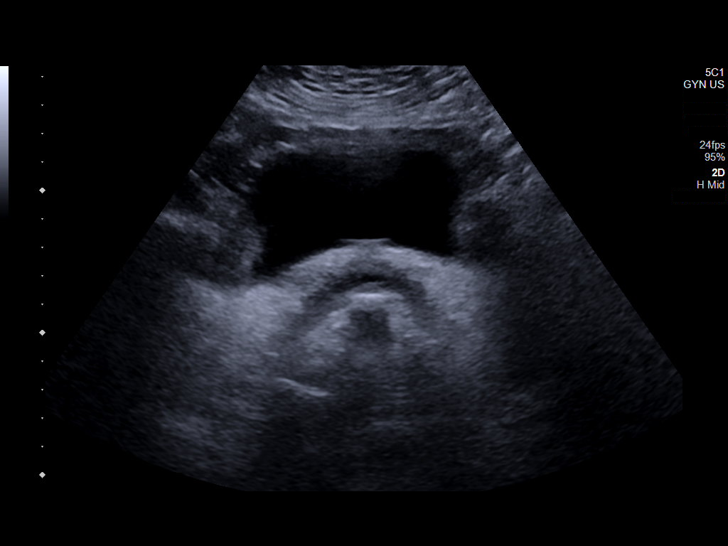
[im 23/56]
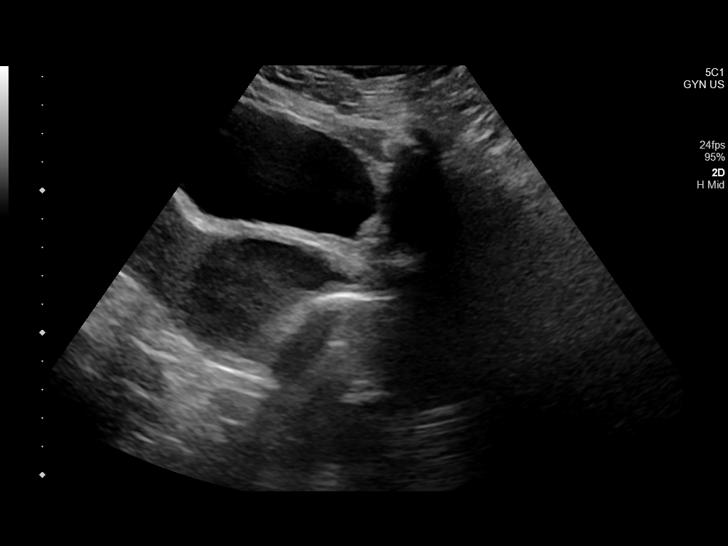
[im 28/56]
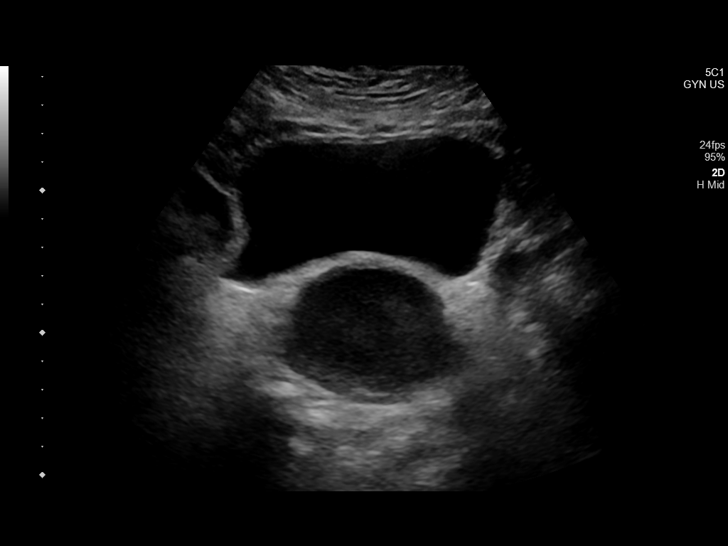
[im 33/56]
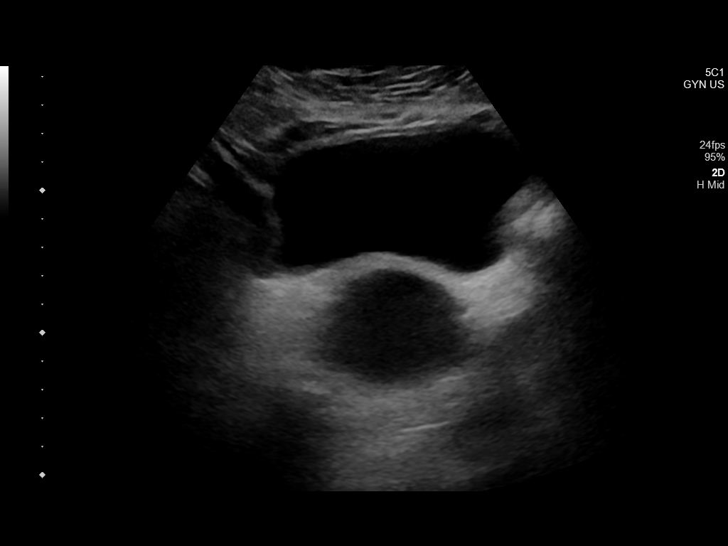
[im 37/56]
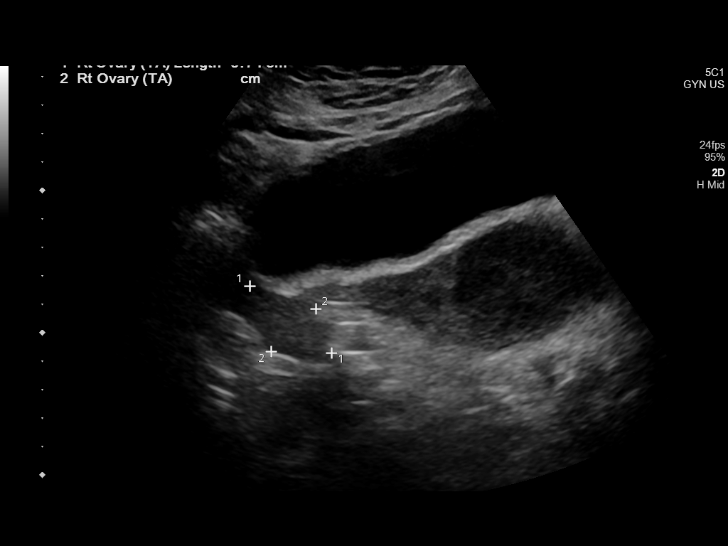
[im 42/56]
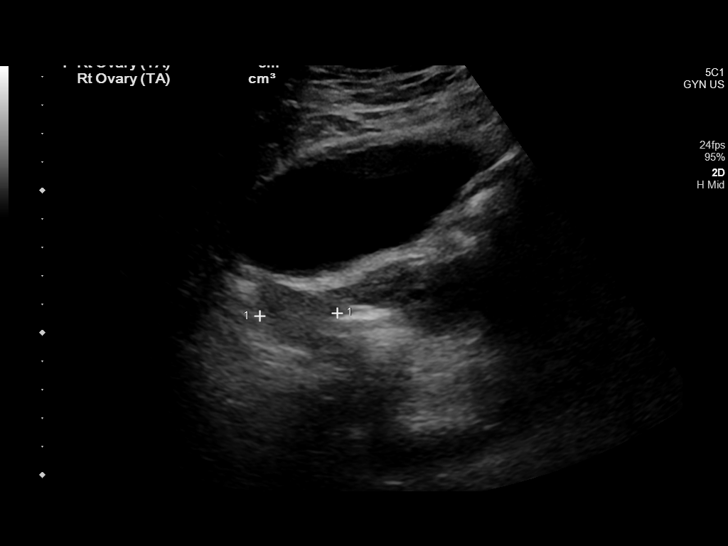
[im 46/56]
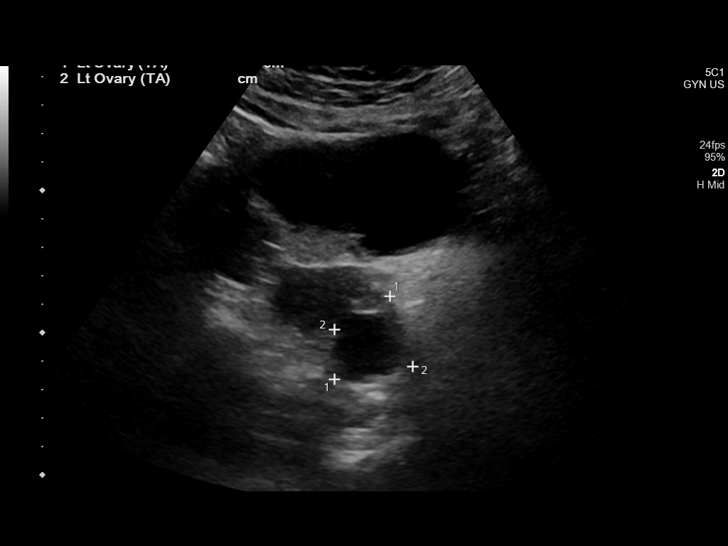
[im 51/56]
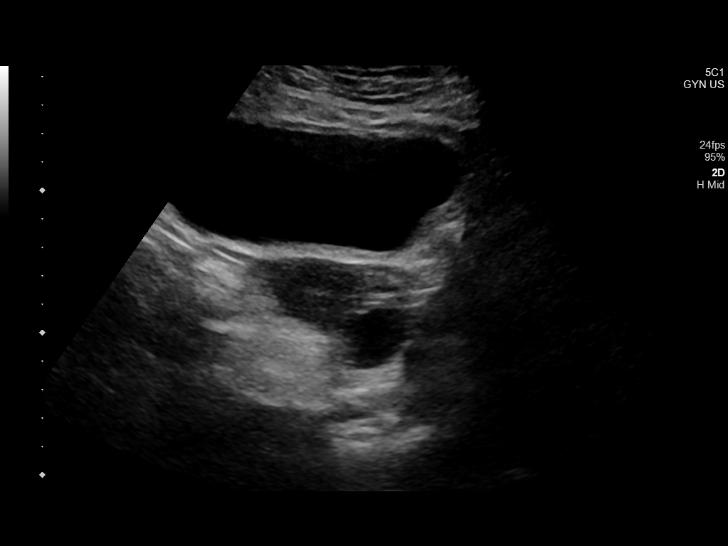
[im 56/56]
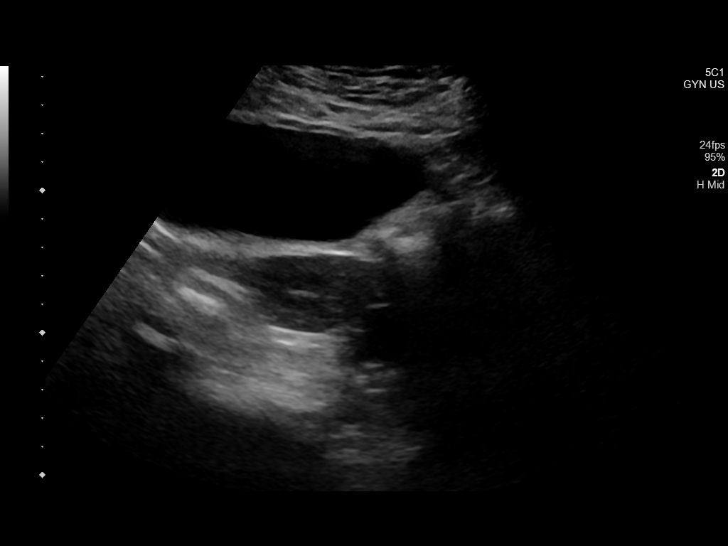

[13 of 25 positions shown; findings below may reference images not displayed]

FINDINGS: Uterus

Measurements: 8.0 x 2.7 x 4.7 cm = volume: 53 mL. The uterus is
unremarkable as visualized. There is a 10.6 x 4.5 x 6.0 cm
hypoechoic collection with no internal vascularity in the
endocervical region or vagina corresponding to the collection seen
on the prior CT. Evaluation is limited due to suboptimal
visualization as the patient refused transvaginal imaging.

This collection likely represents hydrocolpos or hematocolpos and
concerning for outflow obstruction.

There is an apparent 4 by 3 x 4 cm echogenic structure along the
posterior wall of the endocervical region or posterior wall of the
upper vagina which is poorly visualized. There is associated mass
effect and narrowing of the vaginal canal by this mass. Gynecology
consult and direct visualization recommended.

Endometrium

Thickness: 3 mm.  No focal abnormality visualized.

Right ovary

Measurements: 3.7 x 2.2 x 2.7 cm = volume: 11 mL. Normal
appearance/no adnexal mass.

Left ovary

Measurements: 3.5 x 3.0 x 3.2 cm = volume: 18 mL. There is a 2.4 x
2.4 x 1.9 cm cyst in the left ovary.

Other findings:  No abnormal free fluid.
IMPRESSION: Hypoechoic collection in the endocervical region or upper vagina
with findings concerning for a degree of outflow obstruction.
Apparent ill-defined structure along the posterior wall of the
vagina or cervix with associated mass effect. Gynecology consult and
direct visualization recommended.

## 2022-07-21 ENCOUNTER — Encounter: Payer: Self-pay | Admitting: Student in an Organized Health Care Education/Training Program

## 2022-07-21 ENCOUNTER — Ambulatory Visit
Payer: Medicare Other | Attending: Student in an Organized Health Care Education/Training Program | Admitting: Student in an Organized Health Care Education/Training Program

## 2022-07-21 VITALS — BP 123/70 | HR 70 | Temp 97.1°F | Resp 18 | Ht 62.0 in | Wt 233.0 lb

## 2022-07-21 DIAGNOSIS — G894 Chronic pain syndrome: Secondary | ICD-10-CM | POA: Insufficient documentation

## 2022-07-21 DIAGNOSIS — Z0289 Encounter for other administrative examinations: Secondary | ICD-10-CM | POA: Diagnosis present

## 2022-07-21 DIAGNOSIS — M19012 Primary osteoarthritis, left shoulder: Secondary | ICD-10-CM | POA: Diagnosis present

## 2022-07-21 DIAGNOSIS — M25511 Pain in right shoulder: Secondary | ICD-10-CM | POA: Diagnosis present

## 2022-07-21 DIAGNOSIS — M069 Rheumatoid arthritis, unspecified: Secondary | ICD-10-CM | POA: Diagnosis present

## 2022-07-21 DIAGNOSIS — M19011 Primary osteoarthritis, right shoulder: Secondary | ICD-10-CM | POA: Insufficient documentation

## 2022-07-21 DIAGNOSIS — M25512 Pain in left shoulder: Secondary | ICD-10-CM | POA: Diagnosis present

## 2022-07-21 DIAGNOSIS — G8929 Other chronic pain: Secondary | ICD-10-CM | POA: Insufficient documentation

## 2022-07-21 MED ORDER — HYDROCODONE-ACETAMINOPHEN 7.5-325 MG PO TABS: 1.0000 | ORAL_TABLET | Freq: Three times a day (TID) | ORAL | 0 refills | Status: DC | PRN

## 2022-07-21 MED ORDER — HYDROCODONE-ACETAMINOPHEN 7.5-325 MG PO TABS: 1.0000 | ORAL_TABLET | Freq: Three times a day (TID) | ORAL | 0 refills | Status: AC | PRN

## 2022-07-21 NOTE — Progress Notes (Signed)
PROVIDER NOTE: Information contained herein reflects review and annotations entered in association with encounter. Interpretation of such information and data should be left to medically-trained personnel. Information provided to patient can be located elsewhere in the medical record under "Patient Instructions". Document created using STT-dictation technology, any transcriptional errors that may result from process are unintentional.    Patient: Candace Juarez  Service Category: E/M  Provider: Gillis Santa, MD  DOB: [birth year]  DOS: 07/21/2022  Specialty: Interventional Pain Management  MRN: [id number]  Setting: Ambulatory outpatient  PCP: Gladstone Lighter, MD  Type: Established Patient    Referring Provider: Gladstone Lighter, MD  Location: Office  Delivery: Face-to-face     HPI  Candace Juarez, a 71 y.o. year old female, is here today because of her Primary osteoarthritis of both shoulders [M19.011, M19.012]. Candace Juarez primary complain today is Shoulder Pain (left), Hand Pain, Knee Pain (right), Hip Pain (right), and Toe Pain (Left foot 2nd toe)  Last encounter: My last encounter with her was on 04/21/22  Pertinent problems: Candace Juarez has Polyarthralgia; History of left hip replacement; H/O right knee surgery; Chronic pain of both knees; Chronic pain of both hips; Chronic pain syndrome; and Pain management contract signed on their pertinent problem list. Pain Assessment: Severity of Chronic pain is reported as a 6 /10. Location: Shoulder Left/denies. Onset: More than a month ago. Quality: Hervey Ard, Aching. Timing: Constant. Modifying factor(s): heat, meds. Vitals:  height is _0  (1.575 m) and weight is 233 lb (105.7 kg). Her temperature is 97.1 F (36.2 C) (abnormal). Her blood pressure is 123/70 and her pulse is 70. Her respiration is 18 and oxygen saturation is 100%.   Reason for encounter: medication management.    Candace Juarez presents today with increased left shoulder pain that is worse with  abduction.  She had a bilateral glenohumeral joint injection performed 04/16/2021.  She had greater than 75% pain relief for almost 12 months.  Given return of left shoulder pain she would like to repeat.  Risk and benefits reviewed and patient would like to proceed.  Otherwise no significant changes in her medical history.  Demonstrates compliance with her current chronic pain regimen and endorses analgesic and functional benefit.  Pharmacotherapy Assessment   Analgesic: Norco 7.5 mg 3 times daily as needed, quantity 90/month; MME equals 22.5     Monitoring: [place] PMP: PDMP reviewed during this encounter.       Pharmacotherapy: No side-effects or adverse reactions reported. Compliance: No problems identified. Effectiveness: Clinically acceptable.  Dewayne Shorter, RN  07/21/2022  1:31 PM  Sign when Signing Visit Nursing Pain Medication Assessment:  Safety precautions to be maintained throughout the outpatient stay will include: orient to surroundings, keep bed in low position, maintain call bell within reach at all times, provide assistance with transfer out of bed and ambulation.  Medication Inspection Compliance: Pill count conducted under aseptic conditions, in front of the patient. Neither the pills nor the bottle was removed from the patient's sight at any time. Once count was completed pills were immediately returned to the patient in their original bottle.  Medication: Hydrocodone/APAP Pill/Patch Count:  18 of 90 pills remain Pill/Patch Appearance: Markings consistent with prescribed medication Bottle Appearance: Standard pharmacy container. Clearly labeled. Filled Date: 08 / 17 / 2023 Last Medication intake:  Today   UDS:  Summary  Date Value Ref Range Status  10/09/2021 Note  Final    Comment:    ==================================================================== [employer] (MW) ==================================================================== Test  Result       Flag       Units  Drug Present and Declared for Prescription Verification   Alprazolam                     57           EXPECTED   ng/mg creat   Alpha-hydroxyalprazolam        49           EXPECTED   ng/mg creat    Source of alprazolam is a scheduled prescription medication. Alpha-    hydroxyalprazolam is an expected metabolite of alprazolam.    Hydrocodone                    2406         EXPECTED   ng/mg creat   Hydromorphone                  329          EXPECTED   ng/mg creat   Dihydrocodeine                 187          EXPECTED   ng/mg creat   Norhydrocodone                 2395         EXPECTED   ng/mg creat    Sources of hydrocodone include scheduled prescription medications.    Hydromorphone, dihydrocodeine and norhydrocodone are expected    metabolites of hydrocodone. Hydromorphone and dihydrocodeine are    also available as scheduled prescription medications.  ==================================================================== Test                      Result    Flag   Units      Ref Range   Creatinine              130              mg/dL      >=20 ==================================================================== Declared Medications:  The flagging and interpretation on this report are based on the  following declared medications.  Unexpected results may arise from  inaccuracies in the declared medications.   **Note: The testing scope of this panel includes these medications:   Alprazolam (Xanax)  Hydrocodone (Norco)   **Note: The testing scope of this panel does not include the  following reported medications:   Abatacept  Acetaminophen (Norco)  Aluminum Hydroxide (Maalox)  Atropine (Lomotil)  Cetirizine (Zyrtec)  Denosumab (Prolia)  Diphenhydramine (Sominex)  Diphenoxylate (Lomotil)  Estradiol (Estrace)  Eye Drop  Fluticasone (Flonase)  Furosemide (Lasix)  Hydroxychloroquine (Plaquenil)  Levothyroxine (Synthroid)  Magnesium Hydroxide  (Maalox)  Metoprolol  Nystatin (Mycostatin)  Polyethylene Glycol  Torsemide (Demadex)  Venlafaxine (Effexor) ==================================================================== For clinical consultation, please call [phone]. ====================================================================      ROS  Constitutional: Denies any fever or chills Gastrointestinal: No reported hemesis, hematochezia, vomiting, or acute GI distress Musculoskeletal:  left shoulder  pain Neurological: No reported episodes of acute onset apraxia, aphasia, dysarthria, agnosia, amnesia, paralysis, loss of coordination, or loss of consciousness  Medication Review  ALPRAZolam, Abatacept, HYDROcodone-acetaminophen, Metoprolol Succinate, Polyethyl Glycol-Propyl Glycol, aluminum-magnesium hydroxide-simethicone, amoxicillin-clavulanate, buPROPion, cetirizine, cycloSPORINE, denosumab, diphenoxylate-atropine, estradiol, fluticasone, hydroxychloroquine, levothyroxine, nystatin, predniSONE, torsemide, and venlafaxine XR  History Review  Allergy: Ms. Rabadan is allergic to rifampin, sulfa antibiotics, nsaids, and sulfur. Drug: Ms. Eickhoff  reports no history of drug use. Alcohol:  reports no history of alcohol use. Tobacco:  reports that she has never smoked. She has never used smokeless tobacco. Social: Ms. Cassel  reports that she has never smoked. She has never used smokeless tobacco. She reports that she does not drink alcohol and does not use drugs. Medical:  has a past medical history of Arthritis, CHF (congestive heart failure) ([place]), History of hiatal hernia, Hypothyroidism, Lupus ([place]), Rheumatoid arthritis ([place]), and Rosacea. Surgical: Ms. Locatelli  has a past surgical history that includes Joint replacement (Left); Cholesteatoma excision (Right); vericose; Colonoscopy; Breast enhancement surgery; Cholecystectomy; Hernia repair; Tubal ligation; Total laparoscopic hysterectomy with bilateral salpingo oophorectomy  (Bilateral, 10/24/2020); and Cystocele repair (N/A, 10/24/2020). Family: family history is not on file.  Laboratory Chemistry Profile   Renal Lab Results  Component Value Date   BUN 16 10/25/2020   CREATININE 0.87 10/25/2020   GFRNONAA >60 10/25/2020     Hepatic Lab Results  Component Value Date   AST 31 12/17/2020   ALT 22 12/17/2020   ALBUMIN 4.2 12/17/2020   ALKPHOS 71 12/17/2020   LIPASE 28 09/06/2020     Electrolytes Lab Results  Component Value Date   NA 136 10/25/2020   K 4.2 10/25/2020   CL 100 10/25/2020   CALCIUM 8.3 (L) 10/25/2020     Bone No results found for: "VD25OH", "VD125OH2TOT", "QM5784ON6", "EX5284XL2", "25OHVITD1", "25OHVITD2", "25OHVITD3", "TESTOFREE", "TESTOSTERONE"   Inflammation (CRP: Acute Phase) (ESR: Chronic Phase) No results found for: "CRP", "ESRSEDRATE", "LATICACIDVEN"     Note: Above Lab results reviewed.   Physical Exam  General appearance: Well nourished, well developed, and well hydrated. In no apparent acute distress Mental status: Alert, oriented x 3 (person, place, & time)       Respiratory: No evidence of acute respiratory distress Eyes: PERLA Vitals: BP 123/70   Pulse 70   Temp (!) 97.1 F (36.2 C)   Resp 18   Ht _0  (1.575 m)   Wt 233 lb (105.7 kg)   SpO2 100%   BMI 42.62 kg/m  BMI: Estimated body mass index is 42.62 kg/m as calculated from the following:   Height as of this encounter: _1  (1.575 m).   Weight as of this encounter: 233 lb (105.7 kg). Ideal: Ideal body weight: 50.1 kg (110 lb 7.2 oz) Adjusted ideal body weight: 72.3 kg (159 lb 7.5 oz)   Cervical Spine Area Exam  Skin & Axial Inspection: No masses, redness, edema, swelling, or associated skin lesions Alignment: Symmetrical Functional ROM: Pain restricted ROM, bilaterally Stability: No instability detected Muscle Tone/Strength: Functionally intact. No obvious neuro-muscular anomalies detected. Sensory (Neurological): Musculoskeletal pain  pattern Palpation: No palpable anomalies              Upper Extremity (UE) Exam      Side: Right upper extremity   Side: Left upper extremity  Skin & Extremity Inspection: Skin color, temperature, and hair growth are WNL. No peripheral edema or cyanosis. No masses, redness, swelling, asymmetry, or associated skin lesions. No contractures.   Skin & Extremity Inspection: Skin color, temperature, and hair growth are WNL. No peripheral edema or cyanosis. No masses, redness, swelling, asymmetry, or associated skin lesions. No contractures.  Functional ROM: Pain restricted ROM for shoulder and elbow   Functional ROM: Pain restricted ROM for shoulder and elbow  Muscle Tone/Strength: Functionally intact. No obvious neuro-muscular anomalies detected.   Muscle Tone/Strength: Functionally intact. No obvious neuro-muscular anomalies detected.  Sensory (Neurological):  Arthropathic arthralgia  , mild       Sensory (Neurological): Arthropathic arthralgia          Palpation: No palpable anomalies               Palpation: No palpable anomalies              Provocative Test(s):  Phalen's test: deferred Tinel's test: deferred Apley's scratch test (touch opposite shoulder):  Action 1 (Across chest): WNL ROM Action 2 (Overhead): WNLROM Action 3 (LB reach): WNLROM     Provocative Test(s):  Phalen's test: deferred Tinel's test: deferred Apley's scratch test (touch opposite shoulder):  Action 1 (Across chest): Decreased ROM Action 2 (Overhead): Decreased ROM Action 3 (LB reach): Decreased ROM       Assessment   Status Diagnosis  Having a Flare-up Having a Flare-up Controlled 1. 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 Chronic pain of both shoulders   3. Rheumatoid arthritis involving multiple sites, unspecified whether rheumatoid factor present ([place])   4. Chronic pain syndrome   5. Pain management contract signed          Plan of Care   Ms. Kenady Doxtater has a current medication  list which includes the following long-term medication(s): abatacept, bupropion, cetirizine, estradiol, fluticasone, levothyroxine, metoprolol succinate, torsemide, and venlafaxine xr.  Pharmacotherapy (Medications Ordered): Meds ordered this encounter  Medications   HYDROcodone-acetaminophen (NORCO) 7.5-325 MG tablet    Sig: Take 1 tablet by mouth 3 (three) times daily as needed for severe pain. Must last 30 days    Dispense:  90 tablet    Refill:  0    Chronic Pain: STOP Act (Not applicable) Fill 1 day early if closed on refill date. Avoid benzodiazepines within 8 hours of opioids   HYDROcodone-acetaminophen (NORCO) 7.5-325 MG tablet    Sig: Take 1 tablet by mouth 3 (three) times daily as needed for severe pain. Must last 30 days    Dispense:  90 tablet    Refill:  0    Chronic Pain: STOP Act (Not applicable) Fill 1 day early if closed on refill date. Avoid benzodiazepines within 8 hours of opioids   HYDROcodone-acetaminophen (NORCO) 7.5-325 MG tablet    Sig: Take 1 tablet by mouth 3 (three) times daily as needed for severe pain. Must last 30 days    Dispense:  90 tablet    Refill:  0    Chronic Pain: STOP Act (Not applicable) Fill 1 day early if closed on refill date. Avoid benzodiazepines within 8 hours of opioids   Orders Placed This Encounter  Procedures   SHOULDER INJECTION    Standing Status:   Future    Standing Expiration Date:   10/20/2022    Scheduling Instructions:     LEFT shoulder injection    Order Specific Question:   Where will this procedure be performed?    Answer:   [facility]     Follow-up plan:   Return in about 2 weeks (around 08/04/2022) for left shoulder injection, in clinic [facility].    Recent Visits No visits were found meeting these conditions. Showing recent visits within past 90 days and meeting all other requirements Today's Visits Date Type Provider Dept  07/21/22 Office Visit Gillis Santa, MD [facility]  Showing today's visits  and meeting all other requirements Future Appointments Date Type Provider Dept  08/05/22 Appointment Gillis Santa, MD [facility]  09/22/22 Appointment Gillis Santa, MD [facility]  Showing future appointments within next 90 days and meeting all other requirements  I discussed the assessment and treatment plan with the patient. The patient was provided an opportunity to ask questions and all were answered. The patient agreed with the plan and demonstrated an understanding of the instructions.  Patient advised to call back or seek an in-person evaluation if the symptoms or condition worsens.  Duration of encounter: 30 minutes.  Note by: Gillis Santa, MD Date: 07/21/2022; Time: 2:54 PM

## 2022-07-21 NOTE — Progress Notes (Signed)
Nursing Pain Medication Assessment:  Safety precautions to be maintained throughout the outpatient stay will include: orient to surroundings, keep bed in low position, maintain call bell within reach at all times, provide assistance with transfer out of bed and ambulation.  Medication Inspection Compliance: Pill count conducted under aseptic conditions, in front of the patient. Neither the pills nor the bottle was removed from the patient's sight at any time. Once count was completed pills were immediately returned to the patient in their original bottle.  Medication: Hydrocodone/APAP Pill/Patch Count:  18 of 90 pills remain Pill/Patch Appearance: Markings consistent with prescribed medication Bottle Appearance: Standard pharmacy container. Clearly labeled. Filled Date: 08 / 17 / 2023 Last Medication intake:  Today

## 2022-08-05 ENCOUNTER — Ambulatory Visit: Payer: Medicare Other | Admitting: Student in an Organized Health Care Education/Training Program

## 2022-09-22 ENCOUNTER — Encounter: Payer: Self-pay | Admitting: Student in an Organized Health Care Education/Training Program

## 2022-09-22 ENCOUNTER — Ambulatory Visit
Payer: Medicare Other | Attending: Student in an Organized Health Care Education/Training Program | Admitting: Student in an Organized Health Care Education/Training Program

## 2022-09-22 VITALS — BP 139/78 | HR 69 | Temp 98.2°F | Resp 16 | Ht 62.0 in | Wt 234.0 lb

## 2022-09-22 DIAGNOSIS — G894 Chronic pain syndrome: Secondary | ICD-10-CM | POA: Diagnosis not present

## 2022-09-22 DIAGNOSIS — Z0289 Encounter for other administrative examinations: Secondary | ICD-10-CM | POA: Insufficient documentation

## 2022-09-22 DIAGNOSIS — M542 Cervicalgia: Secondary | ICD-10-CM | POA: Insufficient documentation

## 2022-09-22 DIAGNOSIS — M19011 Primary osteoarthritis, right shoulder: Secondary | ICD-10-CM | POA: Diagnosis not present

## 2022-09-22 DIAGNOSIS — M069 Rheumatoid arthritis, unspecified: Secondary | ICD-10-CM | POA: Diagnosis present

## 2022-09-22 DIAGNOSIS — M19012 Primary osteoarthritis, left shoulder: Secondary | ICD-10-CM | POA: Insufficient documentation

## 2022-09-22 DIAGNOSIS — M5412 Radiculopathy, cervical region: Secondary | ICD-10-CM | POA: Diagnosis not present

## 2022-09-22 DIAGNOSIS — M5481 Occipital neuralgia: Secondary | ICD-10-CM | POA: Insufficient documentation

## 2022-09-22 MED ORDER — HYDROCODONE-ACETAMINOPHEN 7.5-325 MG PO TABS: 1.0000 | ORAL_TABLET | Freq: Three times a day (TID) | ORAL | 0 refills | Status: AC | PRN

## 2022-09-22 MED ORDER — HYDROCODONE-ACETAMINOPHEN 7.5-325 MG PO TABS: 1.0000 | ORAL_TABLET | Freq: Three times a day (TID) | ORAL | 0 refills | Status: DC | PRN

## 2022-09-22 NOTE — Progress Notes (Signed)
Nursing Pain Medication Assessment:  Safety precautions to be maintained throughout the outpatient stay will include: orient to surroundings, keep bed in low position, maintain call bell within reach at all times, provide assistance with transfer out of bed and ambulation.  Medication Inspection Compliance: Pill count conducted under aseptic conditions, in front of the patient. Neither the pills nor the bottle was removed from the patient's sight at any time. Once count was completed pills were immediately returned to the patient in their original bottle.  Medication: Hydrocodone/APAP Pill/Patch Count:  10 of 90 pills remain Pill/Patch Appearance: Markings consistent with prescribed medication Bottle Appearance: Standard pharmacy container. Clearly labeled. Filled Date: 10 / 16 / 2023 Last Medication intake:  Today

## 2022-09-22 NOTE — Progress Notes (Signed)
PROVIDER NOTE: Information contained herein reflects review and annotations entered in association with encounter. Interpretation of such information and data should be left to medically-trained personnel. Information provided to patient can be located elsewhere in the medical record under "Patient Instructions". Document created using STT-dictation technology, any transcriptional errors that may result from process are unintentional.    Patient: Candace Juarez  Service Category: E/M  Provider: Gillis Santa, MD  DOB: 02-20-1951  DOS: 09/22/2022  Specialty: Interventional Pain Management  MRN: [id number]  Setting: Ambulatory outpatient  PCP: Gladstone Lighter, MD  Type: Established Patient    Referring Provider: Gladstone Lighter, MD  Location: Office  Delivery: Face-to-face     HPI  [name], a 71 y.o. year old female, is here today because of her Cervical radicular pain [M54.12]. [name] primary complain today is Hip Pain (Bilateral, right is worse), Shoulder Pain (left), and Knee Pain Last encounter: My last encounter with her was on 01/28/2021. Pertinent problems: [name] has Polyarthralgia; History of left hip replacement; H/O right knee surgery; Chronic pain of both knees; Chronic pain of both hips; Chronic pain syndrome; and Pain management contract signed on their pertinent problem list. Pain Assessment: Severity of Chronic pain is reported as a 5 /10. Location: Shoulder Left/radiates down left arm. Onset: More than a month ago. Quality: Aching, Sore (deep, feels like nerve is running into wrist and hands). Timing: Constant. Modifying factor(s): meds, heat, limit movements. Vitals:  height is _0  (1.575 m) and weight is 234 lb (106.1 kg). Her temperature is 98.2 F (36.8 C). Her blood pressure is 139/78 and her pulse is 69. Her respiration is 16 and oxygen saturation is 100%.   Reason for encounter: medication management.    Having increase left shoulder pain, radiating from neck  to shoulder to bilateral elbows and left hand.  Requesting left steroid injection, previously done 04/2021 which was a bilateral glenohumeral joint injection provided her with 75% pain relief for approx 6 months with gradual return of pain there. Right shoulder is doing well but left shoulder is flaring up. Having issues with fine motor control of left hand- dropping utensils.  She has had a cervical spine x-ray in the past that shows cervical degenerative disc disease at C5-C6.  This is consistent with her C5-C6 dermatome as well in regards to her pain radiation.  She is also endorsing symptoms consistent with occipital neuralgia.  She has tried stretching exercises for her cervical spine with limited response.  She is on multimodal analgesics.  Recommend cervical MRI for further work-up.  I will also refill her hydrocodone today, no change in dose.  We will update our annual urine toxicology screen for medication compliance monitoring.   Pharmacotherapy Assessment   Analgesic: Norco 7.5 mg 3 times daily as needed, quantity 90/month; MME equals 22.5     Monitoring: [place] PMP: PDMP reviewed during this encounter.       Pharmacotherapy: No side-effects or adverse reactions reported. Compliance: No problems identified. Effectiveness: Clinically acceptable.  Dewayne Shorter, RN  09/22/2022  2:04 PM  Sign when Signing Visit Nursing Pain Medication Assessment:  Safety precautions to be maintained throughout the outpatient stay will include: orient to surroundings, keep bed in low position, maintain call bell within reach at all times, provide assistance with transfer out of bed and ambulation.  Medication Inspection Compliance: Pill count conducted under aseptic conditions, in front of the patient. Neither the pills nor the bottle was removed from the patient's sight  at any time. Once count was completed pills were immediately returned to the patient in their original bottle.  Medication:  Hydrocodone/APAP Pill/Patch Count:  10 of 90 pills remain Pill/Patch Appearance: Markings consistent with prescribed medication Bottle Appearance: Standard pharmacy container. Clearly labeled. Filled Date: 10 / 16 / 2023 Last Medication intake:  Today     UDS:  Summary  Date Value Ref Range Status  10/09/2021 Note  Final    Comment:    ==================================================================== [employer] 13 (MW) ==================================================================== Test                             Result       Flag       Units  Drug Present and Declared for Prescription Verification   Alprazolam                     57           EXPECTED   ng/mg creat   Alpha-hydroxyalprazolam        49           EXPECTED   ng/mg creat    Source of alprazolam is a scheduled prescription medication. Alpha-    hydroxyalprazolam is an expected metabolite of alprazolam.    Hydrocodone                    2406         EXPECTED   ng/mg creat   Hydromorphone                  329          EXPECTED   ng/mg creat   Dihydrocodeine                 187          EXPECTED   ng/mg creat   Norhydrocodone                 2395         EXPECTED   ng/mg creat    Sources of hydrocodone include scheduled prescription medications.    Hydromorphone, dihydrocodeine and norhydrocodone are expected    metabolites of hydrocodone. Hydromorphone and dihydrocodeine are    also available as scheduled prescription medications.  ==================================================================== Test                      Result    Flag   Units      Ref Range   Creatinine              130              mg/dL      >=20 ==================================================================== Declared Medications:  The flagging and interpretation on this report are based on the  following declared medications.  Unexpected results may arise from  inaccuracies in the declared medications.   **Note: The  testing scope of this panel includes these medications:   Alprazolam (Xanax)  Hydrocodone (Norco)   **Note: The testing scope of this panel does not include the  following reported medications:   Abatacept  Acetaminophen (Norco)  Aluminum Hydroxide (Maalox)  Atropine (Lomotil)  Cetirizine (Zyrtec)  Denosumab (Prolia)  Diphenhydramine (Sominex)  Diphenoxylate (Lomotil)  Estradiol (Estrace)  Eye Drop  Fluticasone (Flonase)  Furosemide (Lasix)  Hydroxychloroquine (Plaquenil)  Levothyroxine (Synthroid)  Magnesium Hydroxide (Maalox)  Metoprolol  Nystatin (Mycostatin)  Polyethylene Glycol  Torsemide (Demadex)  Venlafaxine (Effexor) ==================================================================== For clinical consultation, please call [phone]. ====================================================================      ROS  Constitutional: Denies any fever or chills Gastrointestinal: No reported hemesis, hematochezia, vomiting, or acute GI distress Musculoskeletal:  bilateral shoulder and arm pain Neurological: No reported episodes of acute onset apraxia, aphasia, dysarthria, agnosia, amnesia, paralysis, loss of coordination, or loss of consciousness  Medication Review  ALPRAZolam, Abatacept, HYDROcodone-acetaminophen, Metoprolol Succinate, Polyethyl Glycol-Propyl Glycol, aluminum-magnesium hydroxide-simethicone, buPROPion, cetirizine, cycloSPORINE, denosumab, diphenoxylate-atropine, estradiol, fluticasone, hydroxychloroquine, levothyroxine, nystatin, predniSONE, torsemide, and venlafaxine XR  History Review  Allergy: [name] is allergic to rifampin, sulfa antibiotics, nsaids, and sulfur. Drug: [name]  reports no history of drug use. Alcohol:  reports no history of alcohol use. Tobacco:  reports that she has never smoked. She has never used smokeless tobacco. Social: [name]  reports that she has never smoked. She has never used smokeless tobacco. She reports that  she does not drink alcohol and does not use drugs. Medical:  has a past medical history of Arthritis, CHF (congestive heart failure) ([place]), History of hiatal hernia, Hypothyroidism, Lupus ([place]), Rheumatoid arthritis ([place]), and Rosacea. Surgical: [name]  has a past surgical history that includes Joint replacement (Left); Cholesteatoma excision (Right); vericose; Colonoscopy; Breast enhancement surgery; Cholecystectomy; Hernia repair; Tubal ligation; Total laparoscopic hysterectomy with bilateral salpingo oophorectomy (Bilateral, 10/24/2020); and Cystocele repair (N/A, 10/24/2020). Family: family history is not on file.  Laboratory Chemistry Profile   Renal Lab Results  Component Value Date   BUN 16 10/25/2020   CREATININE 0.87 10/25/2020   GFRNONAA >60 10/25/2020     Hepatic Lab Results  Component Value Date   AST 31 12/17/2020   ALT 22 12/17/2020   ALBUMIN 4.2 12/17/2020   ALKPHOS 71 12/17/2020   LIPASE 28 09/06/2020     Electrolytes Lab Results  Component Value Date   NA 136 10/25/2020   K 4.2 10/25/2020   CL 100 10/25/2020   CALCIUM 8.3 (L) 10/25/2020     Bone No results found for: "VD25OH", "VD125OH2TOT", "AC1660YT0", "ZS0109NA3", "25OHVITD1", "25OHVITD2", "25OHVITD3", "TESTOFREE", "TESTOSTERONE"   Inflammation (CRP: Acute Phase) (ESR: Chronic Phase) No results found for: "CRP", "ESRSEDRATE", "LATICACIDVEN"     Note: Above Lab results reviewed.  Recent Imaging Review  US Carotid Bilateral CLINICAL DATA:  Amaurosis fugax left eye  EXAM: BILATERAL CAROTID DUPLEX ULTRASOUND  TECHNIQUE: Pearline Cables scale imaging, color Doppler and duplex ultrasound were performed of bilateral carotid and vertebral arteries in the neck.  COMPARISON:  None Available.  FINDINGS: Criteria: Quantification of carotid stenosis is based on velocity parameters that correlate the residual internal carotid diameter with NASCET-based stenosis levels, using the diameter of the distal internal  carotid lumen as the denominator for stenosis measurement.  The following velocity measurements were obtained:  RIGHT  ICA: 90/17 cm/sec  CCA: 557/32 cm/sec  SYSTOLIC ICA/CCA RATIO:  0.8  ECA: 153 cm/sec  LEFT  ICA: 120/36 cm/sec  CCA: 20/25 cm/sec  SYSTOLIC ICA/CCA RATIO:  1.2  ECA: 149 cm/sec  RIGHT CAROTID ARTERY: Minor echogenic shadowing plaque formation. No hemodynamically significant right ICA stenosis, velocity elevation, or turbulent flow. Degree of narrowing less than 50%.  RIGHT VERTEBRAL ARTERY:  Normal antegrade flow  LEFT CAROTID ARTERY: Similar scattered minor echogenic plaque formation. No hemodynamically significant left ICA stenosis, velocity elevation, or turbulent flow.  LEFT VERTEBRAL ARTERY:  Normal antegrade flow  IMPRESSION: Bilateral carotid atherosclerosis. Negative for significant stenosis. Degree of narrowing less than 50% bilaterally by  ultrasound criteria.  Patent antegrade vertebral flow bilaterally  Electronically Signed   By: Jerilynn Mages.  Shick M.D.   On: 04/21/2022 16:52 Note: Reviewed        Physical Exam  General appearance: Well nourished, well developed, and well hydrated. In no apparent acute distress Mental status: Alert, oriented x 3 (person, place, & time)       Respiratory: No evidence of acute respiratory distress Eyes: PERLA Vitals: BP 139/78   Pulse 69   Temp 98.2 F (36.8 C)   Resp 16   Ht _0  (1.575 m)   Wt 234 lb (106.1 kg)   SpO2 100%   BMI 42.80 kg/m  BMI: Estimated body mass index is 42.8 kg/m as calculated from the following:   Height as of this encounter: _1  (1.575 m).   Weight as of this encounter: 234 lb (106.1 kg). Ideal: Ideal body weight: 50.1 kg (110 lb 7.2 oz) Adjusted ideal body weight: 72.5 kg (159 lb 13.9 oz)   Cervical Spine Area Exam  Skin & Axial Inspection: No masses, redness, edema, swelling, or associated skin lesions Alignment: Symmetrical Functional ROM: Pain restricted ROM,  bilaterally left greater than right Stability: No instability detected Muscle Tone/Strength: Functionally intact. No obvious neuro-muscular anomalies detected. Sensory (Neurological): Dermatomal pain pattern left C5-C6 Palpation: No palpable anomalies             Upper Extremity (UE) Exam    Side: Right upper extremity  Side: Left upper extremity  Skin & Extremity Inspection: Skin color, temperature, and hair growth are WNL. No peripheral edema or cyanosis. No masses, redness, swelling, asymmetry, or associated skin lesions. No contractures.  Skin & Extremity Inspection: Skin color, temperature, and hair growth are WNL. No peripheral edema or cyanosis. No masses, redness, swelling, asymmetry, or associated skin lesions. No contractures.  Functional ROM: Pain restricted ROM for shoulder and elbow  Functional ROM: Pain restricted ROM for shoulder and elbow  Muscle Tone/Strength: Functionally intact. No obvious neuro-muscular anomalies detected.  Muscle Tone/Strength: Functionally intact. No obvious neuro-muscular anomalies detected.  Sensory (Neurological): Arthropathic arthralgia          Sensory (Neurological): Arthropathic arthralgia        and possibly neurogenic  Palpation: No palpable anomalies              Palpation: No palpable anomalies              Provocative Test(s):  Phalen's test: deferred Tinel's test: deferred Apley's scratch test (touch opposite shoulder):  Action 1 (Across chest): Decreased ROM Action 2 (Overhead): Decreased ROM Action 3 (LB reach): Decreased ROM   Provocative Test(s):  Phalen's test: deferred Tinel's test: deferred Apley's scratch test (touch opposite shoulder):  Action 1 (Across chest): Decreased ROM Action 2 (Overhead): Decreased ROM Action 3 (LB reach): Decreased ROM    Lumbar Exam  Skin & Axial Inspection: No masses, redness, or swelling Alignment: Symmetrical Functional ROM: Unrestricted ROM       Stability: No instability detected Muscle  Tone/Strength: Functionally intact. No obvious neuro-muscular anomalies detected. Sensory (Neurological): Musculoskeletal pain pattern     Gait & Posture Assessment  Ambulation: Unassisted Gait: Relatively normal for age and body habitus Posture: WNL    Lower Extremity Exam      Side: Right lower extremity   Side: Left lower extremity  Stability: No instability observed           Stability: No instability observed  Skin & Extremity Inspection: Skin color, temperature, and hair growth are WNL. No peripheral edema or cyanosis. No masses, redness, swelling, asymmetry, or associated skin lesions. No contractures.   Skin & Extremity Inspection: Evidence of prior arthroplastic surgery  Functional ROM: Pain restricted ROM for hip and knee joints           Functional ROM: Unrestricted ROM                  Muscle Tone/Strength: Functionally intact. No obvious neuro-muscular anomalies detected.   Muscle Tone/Strength: Functionally intact. No obvious neuro-muscular anomalies detected.  Sensory (Neurological): Unimpaired         Sensory (Neurological): Arthropathic arthralgia knee        DTR: Patellar: deferred today Achilles: deferred today Plantar: deferred today   DTR: Patellar: deferred today Achilles: deferred today Plantar: deferred today  Palpation: No palpable anomalies   Palpation: No palpable anomalies      Assessment   Status Diagnosis  Having a Flare-up Having a Flare-up Having a Flare-up 1. Cervical radicular pain   2. Cervico-occipital neuralgia   3. 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 Chronic pain syndrome   5. Cervicalgia   6. Pain management contract signed   7. Rheumatoid arthritis involving multiple sites, unspecified whether rheumatoid factor present [facility])       Updated Problems: Problem  Cervical Radicular Pain  Cervico-Occipital Neuralgia     Plan of Care   [name] has a current medication list which includes the  following long-term medication(s): abatacept, bupropion, cetirizine, estradiol, fluticasone, levothyroxine, metoprolol succinate, torsemide, and venlafaxine xr.   1. Cervical radicular pain - MR CERVICAL SPINE WO CONTRAST; Future  2. Cervico-occipital neuralgia - MR CERVICAL SPINE WO CONTRAST; Future  3. 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 Chronic pain syndrome - MR CERVICAL SPINE WO CONTRAST; Future - HYDROcodone-acetaminophen (NORCO) 7.5-325 MG tablet; Take 1 tablet by mouth 3 (three) times daily as needed for severe pain. Must last 30 days  Dispense: 90 tablet; Refill: 0 - HYDROcodone-acetaminophen (NORCO) 7.5-325 MG tablet; Take 1 tablet by mouth 3 (three) times daily as needed for severe pain. Must last 30 days  Dispense: 90 tablet; Refill: 0 - HYDROcodone-acetaminophen (NORCO) 7.5-325 MG tablet; Take 1 tablet by mouth 3 (three) times daily as needed for severe pain. Must last 30 days  Dispense: 90 tablet; Refill: 0 - Compliance Drug Analysis, Ur  5. Cervicalgia - MR CERVICAL SPINE WO CONTRAST; Future  6. Pain management contract signed - HYDROcodone-acetaminophen (NORCO) 7.5-325 MG tablet; Take 1 tablet by mouth 3 (three) times daily as needed for severe pain. Must last 30 days  Dispense: 90 tablet; Refill: 0 - HYDROcodone-acetaminophen (NORCO) 7.5-325 MG tablet; Take 1 tablet by mouth 3 (three) times daily as needed for severe pain. Must last 30 days  Dispense: 90 tablet; Refill: 0 - HYDROcodone-acetaminophen (NORCO) 7.5-325 MG tablet; Take 1 tablet by mouth 3 (three) times daily as needed for severe pain. Must last 30 days  Dispense: 90 tablet; Refill: 0  7. Rheumatoid arthritis involving multiple sites, unspecified whether rheumatoid factor present ([place]) - HYDROcodone-acetaminophen (NORCO) 7.5-325 MG tablet; Take 1 tablet by mouth 3 (three) times daily as needed for severe pain. Must last 30 days  Dispense: 90 tablet; Refill:  0 - HYDROcodone-acetaminophen (NORCO) 7.5-325 MG tablet; Take 1 tablet by mouth 3 (three) times daily as needed for severe pain. Must last 30 days  Dispense: 90 tablet;  Refill: 0 - HYDROcodone-acetaminophen (NORCO) 7.5-325 MG tablet; Take 1 tablet by mouth 3 (three) times daily as needed for severe pain. Must last 30 days  Dispense: 90 tablet; Refill: 0    Pharmacotherapy (Medications Ordered): Meds ordered this encounter  Medications   HYDROcodone-acetaminophen (NORCO) 7.5-325 MG tablet    Sig: Take 1 tablet by mouth 3 (three) times daily as needed for severe pain. Must last 30 days    Dispense:  90 tablet    Refill:  0    Chronic Pain: STOP Act (Not applicable) Fill 1 day early if closed on refill date. Avoid benzodiazepines within 8 hours of opioids   HYDROcodone-acetaminophen (NORCO) 7.5-325 MG tablet    Sig: Take 1 tablet by mouth 3 (three) times daily as needed for severe pain. Must last 30 days    Dispense:  90 tablet    Refill:  0    Chronic Pain: STOP Act (Not applicable) Fill 1 day early if closed on refill date. Avoid benzodiazepines within 8 hours of opioids   HYDROcodone-acetaminophen (NORCO) 7.5-325 MG tablet    Sig: Take 1 tablet by mouth 3 (three) times daily as needed for severe pain. Must last 30 days    Dispense:  90 tablet    Refill:  0    Chronic Pain: STOP Act (Not applicable) Fill 1 day early if closed on refill date. Avoid benzodiazepines within 8 hours of opioids   Orders:  Orders Placed This Encounter  Procedures   SHOULDER INJECTION    Standing Status:   Future    Standing Expiration Date:   12/23/2022    Scheduling Instructions:     Procedure: Intra-articular shoulder (Glenohumeral) joint injection     Side: LEFT     Level: Glenohumeral joint               Sedation: PO Valium     Timeframe: As permitted by the schedule    Order Specific Question:   Where will this procedure be performed?    Answer:   [facility]   MR CERVICAL SPINE WO  CONTRAST    Patient presents with axial pain with possible radicular component. Please assist [place] in identifying specific level(s) and laterality of any additional findings such as: 1. Facet (Zygapophyseal) joint DJD (Hypertrophy, space narrowing, subchondral sclerosis, and/or osteophyte formation) 2. DDD and/or IVDD (Loss of disc height, desiccation, gas patterns, osteophytes, endplate sclerosis, or "Black disc disease") 3. Pars defects 4. Spondylolisthesis, spondylosis, and/or spondyloarthropathies (include Degree/Grade of displacement in mm) (stability) 5. Vertebral body Fractures (acute/chronic) (state percentage of collapse) 6. Demineralization (osteopenia/osteoporotic) 7. Bone pathology 8. Foraminal narrowing  9. Surgical changes 10. Central, Lateral Recess, and/or Foraminal Stenosis (include AP diameter of stenosis in mm) 11. Surgical changes (hardware type, status, and presence of fibrosis) 12. Modic Type Changes (MRI only) 13. IVDD (Disc bulge, protrusion, herniation, extrusion) (Level, laterality, extent)    Standing Status:   Future    Standing Expiration Date:   10/22/2022    Scheduling Instructions:     Imaging must be done as soon as possible. Inform patient that order will expire within 30 days and I will not renew it.    Order Specific Question:   What is the patient's sedation requirement?    Answer:   No Sedation    Order Specific Question:   Does the patient have a pacemaker or implanted devices?    Answer:   No    Order Specific Question:  Preferred imaging location?    Answer:   [facility] (table limit-350lbs)    Order Specific Question:   Call Results- Best Contact Number?    Answer:   [phone] ([facility])    Order Specific Question:   Radiology Contrast Protocol - do NOT remove file path    Answer:   [email or web address]   Compliance Drug Analysis, Ur    Volume: 30 ml(s). Minimum 3 ml of urine is needed. Document  temperature of fresh sample. Indications: Long term (current) use of opiate analgesic (X41.287) Test#: [id number] (Comprehensive Profile)    Order Specific Question:   Release to patient    Answer:   Immediate   Follow-up plan:   Return in about 2 weeks (around 10/06/2022) for left shoulder injection , in clinic (PO Valium).    Recent Visits Date Type Provider Dept  07/21/22 Office Visit Gillis Santa, MD [facility]  Showing recent visits within past 90 days and meeting all other requirements Today's Visits Date Type Provider Dept  09/22/22 Office Visit Gillis Santa, MD [facility]  Showing today's visits and meeting all other requirements Future Appointments Date Type Provider Dept  10/05/22 Appointment Gillis Santa, MD [facility]  Showing future appointments within next 90 days and meeting all other requirements  I discussed the assessment and treatment plan with the patient. The patient was provided an opportunity to ask questions and all were answered. The patient agreed with the plan and demonstrated an understanding of the instructions.  Patient advised to call back or seek an in-person evaluation if the symptoms or condition worsens.  Duration of encounter: 30 minutes.  Note by: Gillis Santa, MD Date: 09/22/2022; Time: 2:44 PM

## 2022-09-24 LAB — COMPLIANCE DRUG ANALYSIS, UR

## 2022-10-05 ENCOUNTER — Ambulatory Visit
Admission: RE | Admit: 2022-10-05 | Discharge: 2022-10-05 | Disposition: A | Discharge status: Home / Self Care | Payer: Medicare Other | Source: Ambulatory Visit | Attending: Student in an Organized Health Care Education/Training Program | Admitting: Student in an Organized Health Care Education/Training Program

## 2022-10-05 ENCOUNTER — Ambulatory Visit
Payer: Medicare Other | Attending: Student in an Organized Health Care Education/Training Program | Admitting: Student in an Organized Health Care Education/Training Program

## 2022-10-05 ENCOUNTER — Encounter: Payer: Self-pay | Admitting: Student in an Organized Health Care Education/Training Program

## 2022-10-05 VITALS — BP 119/73 | HR 76 | Temp 97.3°F | Resp 21 | Ht 62.0 in | Wt 234.0 lb

## 2022-10-05 DIAGNOSIS — M19012 Primary osteoarthritis, left shoulder: Secondary | ICD-10-CM | POA: Insufficient documentation

## 2022-10-05 DIAGNOSIS — G894 Chronic pain syndrome: Secondary | ICD-10-CM | POA: Diagnosis present

## 2022-10-05 DIAGNOSIS — M19011 Primary osteoarthritis, right shoulder: Secondary | ICD-10-CM | POA: Insufficient documentation

## 2022-10-05 MED ORDER — DIAZEPAM 5 MG PO TABS
5.0000 mg | ORAL_TABLET | ORAL | Status: AC
  Administered 2022-10-05: 5 mg via ORAL

## 2022-10-05 MED ORDER — LIDOCAINE HCL 2 % IJ SOLN
20.0000 mL | Freq: Once | INTRAMUSCULAR | Status: AC
  Administered 2022-10-05: 400 mg
  Filled 2022-10-05: qty 20

## 2022-10-05 MED ORDER — IOHEXOL 180 MG/ML  SOLN
10.0000 mL | Freq: Once | INTRAMUSCULAR | Status: AC
  Administered 2022-10-05: 10 mL via INTRA_ARTICULAR
  Filled 2022-10-05: qty 20

## 2022-10-05 MED ORDER — ROPIVACAINE HCL 2 MG/ML IJ SOLN
4.0000 mL | Freq: Once | INTRAMUSCULAR | Status: AC
  Administered 2022-10-05: 4 mL via INTRA_ARTICULAR
  Filled 2022-10-05: qty 20

## 2022-10-05 MED ORDER — METHYLPREDNISOLONE ACETATE 40 MG/ML IJ SUSP
40.0000 mg | Freq: Once | INTRAMUSCULAR | Status: AC
  Administered 2022-10-05: 40 mg via INTRA_ARTICULAR
  Filled 2022-10-05: qty 1

## 2022-10-05 MED ORDER — DIAZEPAM 5 MG PO TABS
ORAL_TABLET | ORAL | Status: AC
  Filled 2022-10-05: qty 1

## 2022-10-05 NOTE — Progress Notes (Signed)
Safety precautions to be maintained throughout the outpatient stay will include: orient to surroundings, keep bed in low position, maintain call bell within reach at all times, provide assistance with transfer out of bed and ambulation.  

## 2022-10-05 NOTE — Patient Instructions (Signed)

## 2022-10-05 NOTE — Progress Notes (Signed)
PROVIDER NOTE: Information contained herein reflects review and annotations entered in association with encounter. Interpretation of such information and data should be left to medically-trained personnel. Information provided to patient can be located elsewhere in the medical record under "Patient Instructions". Document created using STT-dictation technology, any transcriptional errors that may result from process are unintentional.    Patient: Candace Juarez  Service Category: Procedure  Provider: Edward Jolly, MD  DOB: [birth year]  DOS: 10/05/2022  Location: [facility]  MRN: [id number]  Setting: Ambulatory - outpatient  Referring Provider: Enid Baas, MD  Type: Established Patient  Specialty: Interventional Pain Management  PCP: Enid Baas, MD   Primary Reason for Visit: Interventional Pain Management Treatment. CC: Shoulder Pain (left)  Procedure:          Anesthesia, Analgesia, Anxiolysis:  Type: Therapeutic Glenohumeral Joint (shoulder) Injection #2  Primary Purpose: Therapeutic Region: Superior Shoulder Area Level:  Shoulder Target Area: Glenohumeral Joint (shoulder) Approach: Anterior approach. Laterality: Left  Type: Local Anesthesia with PO Valium  Local Anesthetic: Lidocaine 1-2%  Position: Supine   Indications: 1. 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 Chronic pain syndrome     Pain Score: Pre-procedure: 5 /10 Post-procedure: 3/10  Pre-op H&P Assessment:  Candace Juarez is a 71 y.o. (year old), female patient, seen today for interventional treatment. She  has a past surgical history that includes Joint replacement (Left); Cholesteatoma excision (Right); vericose; Colonoscopy; Breast enhancement surgery; Cholecystectomy; Hernia repair; Tubal ligation; Total laparoscopic hysterectomy with bilateral salpingo oophorectomy (Bilateral, 10/24/2020); and Cystocele repair (N/A, 10/24/2020). Candace Juarez has a current medication list which  includes the following prescription(s): abatacept, alprazolam, aluminum-magnesium hydroxide-simethicone, bupropion, cetirizine, cyclosporine, denosumab, diphenoxylate-atropine, estradiol, fluticasone, [START ON 11/21/2022] hydrocodone-acetaminophen, [START ON 10/22/2022] hydrocodone-acetaminophen, [START ON 12/21/2022] hydrocodone-acetaminophen, hydroxychloroquine, levothyroxine, metoprolol succinate, nystatin, polyethyl glycol-propyl glycol, prednisone, torsemide, and venlafaxine xr. Her primarily concern today is the Shoulder Pain (left)  Initial Vital Signs:  Pulse/HCG Rate: 76ECG Heart Rate: 77 Temp: (!) 97.3 F (36.3 C) Resp: (!) 21 BP: (!) 166/75 SpO2: 99 %  BMI: Estimated body mass index is 42.8 kg/m as calculated from the following:   Height as of this encounter: 5\' 2"  (1.575 m).   Weight as of this encounter: 234 lb (106.1 kg).  Risk Assessment: Allergies: Reviewed. She is allergic to rifampin, sulfa antibiotics, nsaids, and sulfur.  Allergy Precautions: None required Coagulopathies: Reviewed. None identified.  Blood-thinner therapy: None at this time Active Infection(s): Reviewed. None identified. Candace Juarez is afebrile  Site Confirmation: Candace Juarez was asked to confirm the procedure and laterality before marking the site Procedure checklist: Completed Consent: Before the procedure and under the influence of no sedative(s), amnesic(s), or anxiolytics, the patient was informed of the treatment options, risks and possible complications. To fulfill our ethical and legal obligations, as recommended by the American Medical Association's Code of Ethics, I have informed the patient of my clinical impression; the nature and purpose of the treatment or procedure; the risks, benefits, and possible complications of the intervention; the alternatives, including doing nothing; the risk(s) and benefit(s) of the alternative treatment(s) or procedure(s); and the risk(s) and benefit(s) of doing  nothing. The patient was provided information about the general risks and possible complications associated with the procedure. These may include, but are not limited to: failure to achieve desired goals, infection, bleeding, organ or nerve damage, allergic reactions, paralysis, and death. In addition, the patient was informed of those risks and complications associated to the procedure, such as  failure to decrease pain; infection; bleeding; organ or nerve damage with subsequent damage to sensory, motor, and/or autonomic systems, resulting in permanent pain, numbness, and/or weakness of one or several areas of the body; allergic reactions; (i.e.: anaphylactic reaction); and/or death. Furthermore, the patient was informed of those risks and complications associated with the medications. These include, but are not limited to: allergic reactions (i.e.: anaphylactic or anaphylactoid reaction(s)); adrenal axis suppression; blood sugar elevation that in diabetics may result in ketoacidosis or comma; water retention that in patients with history of congestive heart failure may result in shortness of breath, pulmonary edema, and decompensation with resultant heart failure; weight gain; swelling or edema; medication-induced neural toxicity; particulate matter embolism and blood vessel occlusion with resultant organ, and/or nervous system infarction; and/or aseptic necrosis of one or more joints. Finally, the patient was informed that Medicine is not an exact science; therefore, there is also the possibility of unforeseen or unpredictable risks and/or possible complications that may result in a catastrophic outcome. The patient indicated having understood very clearly. We have given the patient no guarantees and we have made no promises. Enough time was given to the patient to ask questions, all of which were answered to the patient's satisfaction. Candace Juarez has indicated that she wanted to continue with the  procedure. Attestation: I, the ordering provider, attest that I have discussed with the patient the benefits, risks, side-effects, alternatives, likelihood of achieving goals, and potential problems during recovery for the procedure that I have provided informed consent. Date  Time: 10/05/2022  9:33 AM  Pre-Procedure Preparation:  Monitoring: As per clinic protocol. Respiration, ETCO2, SpO2, BP, heart rate and rhythm monitor placed and checked for adequate function Safety Precautions: Patient was assessed for positional comfort and pressure points before starting the procedure. Time-out: I initiated and conducted the "Time-out" before starting the procedure, as per protocol. The patient was asked to participate by confirming the accuracy of the "Time Out" information. Verification of the correct person, site, and procedure were performed and confirmed by me, the nursing staff, and the patient. "Time-out" conducted as per Joint Commission's Universal Protocol (UP.01.01.01). Time: 1010  Description of Procedure:          Area Prepped: Entire shoulder Area DuraPrep (Iodine Povacrylex [0.7% available iodine] and Isopropyl Alcohol, 74% w/w) Safety Precautions: Aspiration looking for blood return was conducted prior to all injections. At no point did we inject any substances, as a needle was being advanced. No attempts were made at seeking any paresthesias. Safe injection practices and needle disposal techniques used. Medications properly checked for expiration dates. SDV (single dose vial) medications used. Description of the Procedure: Protocol guidelines were followed. The patient was placed in position over the procedure table. The target area was identified and the area prepped in the usual manner. Skin & deeper tissues infiltrated with local anesthetic. Appropriate amount of time allowed to pass for local anesthetics to take effect. The procedure needles were then advanced to the target area. Proper  needle placement secured. Negative aspiration confirmed. Solution injected in intermittent fashion, asking for systemic symptoms every 0.5cc of injectate. The needles were then removed and the area cleansed, making sure to leave some of the prepping solution back to take advantage of its long term bactericidal properties.         Vitals:   10/05/22 0937 10/05/22 1007 10/05/22 1011 10/05/22 1013  BP: (!) 166/75 (!) 144/64 132/64 119/73  Pulse: 76     Resp:  (!) 21 15 (!)  21  Temp: (!) 97.3 F (36.3 C)     TempSrc: Temporal     SpO2: 99% 97% 97% 97%  Weight: 234 lb (106.1 kg)     Height: 5\' 2"  (1.575 m)       Start Time: 1010 hrs. End Time: 1012 hrs. Materials:  Needle(s) Type: Spinal Needle Gauge: 22G Length: 3.5-in Medication(s): Please see orders for medications and dosing details. 5cc solution made of 4 cc of 0.2 Ropivacaine and 1 cc of methylprednisolone (40 mg/cc).  5 cc injected in left shoulder Imaging Guidance (Non-Spinal):          Type of Imaging Technique: Fluoroscopy Guidance (Non-Spinal) Indication(s): Assistance in needle guidance and placement for procedures requiring needle placement in or near specific anatomical locations not easily accessible without such assistance. Exposure Time: Please see nurses notes. Contrast: Before injecting any contrast, we confirmed that the patient did not have an allergy to iodine, shellfish, or radiological contrast. Once satisfactory needle placement was completed at the desired level, radiological contrast was injected. Contrast injected under live fluoroscopy. No contrast complications. See chart for type and volume of contrast used. Fluoroscopic Guidance: I was personally present during the use of fluoroscopy. "Tunnel Vision Technique" used to obtain the best possible view of the target area. Parallax error corrected before commencing the procedure. "Direction-depth-direction" technique used to introduce the needle under continuous  pulsed fluoroscopy. Once target was reached, antero-posterior, oblique, and lateral fluoroscopic projection used confirm needle placement in all planes. Images permanently stored in EMR. Interpretation: I personally interpreted the imaging intraoperatively. Adequate needle placement confirmed in multiple planes. Appropriate spread of contrast into desired area was observed. No evidence of afferent or efferent intravascular uptake. Permanent images saved into the patient's record.   Post-operative Assessment:  Post-procedure Vital Signs:  Pulse/HCG Rate: 7678 Temp: (!) 97.3 F (36.3 C) Resp: (!) 21 BP: 119/73 SpO2: 97 %  EBL: None  Complications: No immediate post-treatment complications observed by team, or reported by patient.  Note: The patient tolerated the entire procedure well. A repeat set of vitals were taken after the procedure and the patient was kept under observation following institutional policy, for this type of procedure. Post-procedural neurological assessment was performed, showing return to baseline, prior to discharge. The patient was provided with post-procedure discharge instructions, including a section on how to identify potential problems. Should any problems arise concerning this procedure, the patient was given instructions to immediately contact , at any time, without hesitation. In any case, we plan to contact the patient by telephone for a follow-up status report regarding this interventional procedure.  Comments:  No additional relevant information.  Plan of Care  Orders:  Orders Placed This Encounter  Procedures   DG PAIN [facility] C-ARM 1-60 MIN NO REPORT    Intraoperative interpretation by procedural physician at [facility].    Standing Status:   Standing    Number of Occurrences:   1    Order Specific Question:   Reason for exam:    Answer:   Assistance in needle guidance and placement for procedures requiring needle placement in or near  specific anatomical locations not easily accessible without such assistance.   Chronic Opioid Analgesic:  Norco 7.5 mg 3 times daily as needed, quantity 90/month; MME equals 22.5     Medications ordered for procedure: Meds ordered this encounter  Medications   iohexol (OMNIPAQUE) 180 MG/ML injection 10 mL    Must be Myelogram-compatible. If not available, you may substitute with  a water-soluble, non-ionic, hypoallergenic, myelogram-compatible radiological contrast medium.   lidocaine (XYLOCAINE) 2 % (with pres) injection 400 mg   diazepam (VALIUM) tablet 5 mg    Make sure Flumazenil is available in the pyxis when using this medication. If oversedation occurs, administer 0.2 mg IV over 15 sec. If after 45 sec no response, administer 0.2 mg again over 1 min; may repeat at 1 min intervals; not to exceed 4 doses (1 mg)   ropivacaine (PF) 2 mg/mL (0.2%) (NAROPIN) injection 4 mL   methylPREDNISolone acetate (DEPO-MEDROL) injection 40 mg   Medications administered: We administered iohexol, lidocaine, diazepam, ropivacaine (PF) 2 mg/mL (0.2%), and methylPREDNISolone acetate.  See the medical record for exact dosing, route, and time of administration.  Follow-up plan:   Return in about 5 weeks (around 11/09/2022) for Post Procedure Evaluation, virtual.    Recent Visits Date Type Provider Dept  09/22/22 Office Visit Edward Jolly, MD [facility]  07/21/22 Office Visit Edward Jolly, MD [facility]  Showing recent visits within past 90 days and meeting all other requirements Today's Visits Date Type Provider Dept  10/05/22 Procedure visit Edward Jolly, MD [facility]  Showing today's visits and meeting all other requirements Future Appointments Date Type Provider Dept  11/11/22 Appointment Edward Jolly, MD [facility]  Showing future appointments within next 90 days and meeting all other requirements  Disposition: Discharge home  Discharge (Date   Time): 10/05/2022; 1017 hrs.   Primary Care Physician: Enid Baas, MD Location: [facility] Note by: Edward Jolly, MD Date: 10/05/2022; Time: 11:17 AM  Disclaimer:  Medicine is not an exact science. The only guarantee in medicine is that nothing is guaranteed. It is important to note that the decision to proceed with this intervention was based on the information collected from the patient. The Data and conclusions were drawn from the patient's questionnaire, the interview, and the physical examination. Because the information was provided in large part by the patient, it cannot be guaranteed that it has not been purposely or unconsciously manipulated. Every effort has been made to obtain as much relevant data as possible for this evaluation. It is important to note that the conclusions that lead to this procedure are derived in large part from the available data. Always take into account that the treatment will also be dependent on availability of resources and existing treatment guidelines, considered by other Pain Management Practitioners as being common knowledge and practice, at the time of the intervention. For Medico-Legal purposes, it is also important to point out that variation in procedural techniques and pharmacological choices are the acceptable norm. The indications, contraindications, technique, and results of the above procedure should only be interpreted and judged by a Board-Certified Interventional Pain Specialist with extensive familiarity and expertise in the same exact procedure and technique.

## 2022-10-06 ENCOUNTER — Telehealth: Payer: Self-pay

## 2022-10-06 NOTE — Telephone Encounter (Signed)
Post procedure phone call.  LM 

## 2022-10-07 ENCOUNTER — Ambulatory Visit
Admission: RE | Admit: 2022-10-07 | Discharge: 2022-10-07 | Disposition: A | Discharge status: Home / Self Care | Payer: Medicare Other | Source: Ambulatory Visit | Attending: Student in an Organized Health Care Education/Training Program | Admitting: Student in an Organized Health Care Education/Training Program

## 2022-10-07 DIAGNOSIS — M542 Cervicalgia: Secondary | ICD-10-CM | POA: Insufficient documentation

## 2022-10-07 DIAGNOSIS — G894 Chronic pain syndrome: Secondary | ICD-10-CM | POA: Insufficient documentation

## 2022-10-07 DIAGNOSIS — M5481 Occipital neuralgia: Secondary | ICD-10-CM | POA: Insufficient documentation

## 2022-10-07 DIAGNOSIS — M5412 Radiculopathy, cervical region: Secondary | ICD-10-CM | POA: Diagnosis present

## 2022-11-10 ENCOUNTER — Telehealth: Payer: Self-pay

## 2022-11-10 NOTE — Telephone Encounter (Signed)
LM for patient to call office for pre virtual appointment questions.  

## 2022-11-11 ENCOUNTER — Encounter: Payer: Self-pay | Admitting: Student in an Organized Health Care Education/Training Program

## 2022-11-11 ENCOUNTER — Ambulatory Visit
Payer: Medicare Other | Attending: Student in an Organized Health Care Education/Training Program | Admitting: Student in an Organized Health Care Education/Training Program

## 2022-11-11 DIAGNOSIS — M5481 Occipital neuralgia: Secondary | ICD-10-CM

## 2022-11-11 DIAGNOSIS — M19011 Primary osteoarthritis, right shoulder: Secondary | ICD-10-CM | POA: Diagnosis not present

## 2022-11-11 DIAGNOSIS — M5412 Radiculopathy, cervical region: Secondary | ICD-10-CM | POA: Diagnosis not present

## 2022-11-11 DIAGNOSIS — G894 Chronic pain syndrome: Secondary | ICD-10-CM

## 2022-11-11 DIAGNOSIS — M25551 Pain in right hip: Secondary | ICD-10-CM

## 2022-11-11 DIAGNOSIS — M542 Cervicalgia: Secondary | ICD-10-CM

## 2022-11-11 DIAGNOSIS — G8929 Other chronic pain: Secondary | ICD-10-CM

## 2022-11-11 DIAGNOSIS — M19012 Primary osteoarthritis, left shoulder: Secondary | ICD-10-CM

## 2022-11-11 NOTE — Progress Notes (Signed)
Patient: Candace Juarez  Service Category: E/M  Provider: Gillis Santa, MD  DOB: 19-Jul-1951  DOS: 11/11/2022  Location: Office  MRN: [id number]  Setting: Ambulatory outpatient  Referring Provider: Gladstone Lighter, MD  Type: Established Patient  Specialty: Interventional Pain Management  PCP: Candace Lighter, MD  Location: Remote location  Delivery: TeleHealth     Virtual Encounter - Pain Management PROVIDER NOTE: Information contained herein reflects review and annotations entered in association with encounter. Interpretation of such information and data should be left to medically-trained personnel. Information provided to patient can be located elsewhere in the medical record under "Patient Instructions". Document created using STT-dictation technology, any transcriptional errors that may result from process are unintentional.    Contact & Pharmacy Preferred: [phone] Home: [phone] (home) Mobile: [phone] (mobile) E-mail: [email or web address]  [id number] - [address], [address] [place] [address] [place] [address] Phone: [phone] Fax: [phone]   Pre-screening  Candace Juarez offered "in-person" vs "virtual" encounter. She indicated preferring virtual for this encounter.   Reason COVID-19*  Social distancing based on CDC and AMA recommendations.   I contacted Candace Juarez on 11/11/2022 via telephone.      I clearly identified myself as Candace Santa, MD. I verified that I was speaking with the correct person using two identifiers (Name: Candace Juarez, and date of birth: 1951-01-18).  Consent I sought verbal advanced consent from Candace Juarez for virtual visit interactions. I informed Candace Juarez of possible security and privacy concerns, risks, and limitations associated with providing "not-in-person" medical evaluation and management services. I also informed Candace Juarez of the availability of "in-person"  appointments. Finally, I informed her that there would be a charge for the virtual visit and that she could be  personally, fully or partially, financially responsible for it. Candace Juarez expressed understanding and agreed to proceed.   Historic Elements   Candace Juarez is a 72 y.o. year old, female patient evaluated today after our last contact on 10/05/2022. Candace Juarez  has a past medical history of Arthritis, CHF (congestive heart failure) (Candace Juarez), History of hiatal hernia, Hypothyroidism, Lupus (Candace Juarez), Rheumatoid arthritis (Candace Juarez), and Rosacea. She also  has a past surgical history that includes Joint replacement (Left); Cholesteatoma excision (Right); vericose; Colonoscopy; Breast enhancement surgery; Cholecystectomy; Hernia repair; Tubal ligation; Total laparoscopic hysterectomy with bilateral salpingo oophorectomy (Bilateral, 10/24/2020); and Cystocele repair (N/A, 10/24/2020). Candace Juarez has a current medication list which includes the following prescription(s): abatacept, alprazolam, bupropion, cetirizine, cyclosporine, denosumab, diphenoxylate-atropine, estradiol, fluticasone, [START ON 11/21/2022] hydrocodone-acetaminophen, hydrocodone-acetaminophen, [START ON 12/21/2022] hydrocodone-acetaminophen, hydroxychloroquine, levothyroxine, losartan, metoprolol succinate, nystatin, polyethyl glycol-propyl glycol, torsemide, venlafaxine xr, aluminum-magnesium hydroxide-simethicone, bupropion, and prednisone. She  reports that she has never smoked. She has never used smokeless tobacco. She reports that she does not drink alcohol and does not use drugs. Candace Juarez is allergic to rifampin, sulfa antibiotics, nsaids, and sulfur.  Estimated body mass index is 42.8 kg/m as calculated from the following:   Height as of 10/05/22: _1  (1.575 m).   Weight as of 10/05/22: 234 lb (106.1 kg).  HPI  Today, she is being contacted for a post-procedure assessment.   Post-procedure evaluation  Type: Therapeutic Glenohumeral  Joint (shoulder) Injection #2  Primary Purpose: Therapeutic Region: Superior Shoulder Area Level:  Shoulder Target Area: Glenohumeral Joint (shoulder) Approach: Anterior approach. Laterality: Left   Effectiveness:  Initial hour after procedure: 90 % (injection had started to calm down the  pain.)  Subsequent 4-6 hours post-procedure: 80 %  Analgesia past initial 6 hours: 100 % (shoulder remains pain free at this point,  ROM is improving.)  Ongoing improvement:  Analgesic: 100% Function: Candace Juarez reports improvement in function ROM: Candace Juarez reports improvement in ROM    Laboratory Chemistry Profile   Renal Lab Results  Component Value Date   BUN 16 10/25/2020   CREATININE 0.87 10/25/2020   GFRNONAA >60 10/25/2020    Hepatic Lab Results  Component Value Date   AST 31 12/17/2020   ALT 22 12/17/2020   ALBUMIN 4.2 12/17/2020   ALKPHOS 71 12/17/2020   LIPASE 28 09/06/2020    Electrolytes Lab Results  Component Value Date   NA 136 10/25/2020   K 4.2 10/25/2020   CL 100 10/25/2020   CALCIUM 8.3 (L) 10/25/2020    Bone No results found for: "VD25OH", "VD125OH2TOT", "NT6144RX5", "QM0867YP9", "25OHVITD1", "25OHVITD2", "25OHVITD3", "TESTOFREE", "TESTOSTERONE"  Inflammation (CRP: Acute Phase) (ESR: Chronic Phase) No results found for: "CRP", "ESRSEDRATE", "LATICACIDVEN"       Note: Above Lab results reviewed.  Imaging  MR CERVICAL SPINE WO CONTRAST CLINICAL DATA:  Neck pain, chronic Neck pain, chronic, degenerative changes on xray Cervical radiculopathy, no red flags left neck and arm pain and left arm weakness  EXAM: MRI CERVICAL SPINE WITHOUT CONTRAST  TECHNIQUE: Multiplanar, multisequence MR imaging of the cervical spine was performed. No intravenous contrast was administered.  COMPARISON:  None Available.  FINDINGS: Alignment: Physiologic.  Vertebrae: No evidence of acute fracture, discitis, or aggressive osseous lesion.  Cord: Normal signal and  morphology.  Posterior Fossa, vertebral arteries, paraspinal tissues: Negative.  Disc levels: The craniocervical junction is unremarkable.  C2-C3: No significant spinal canal or neural foraminal narrowing.  C3-C4: There is no significant disc bulge. Uncovertebral joint hypertrophy and bilateral facet arthropathy. There is no significant spinal canal stenosis. There is moderate bilateral neural foraminal narrowing.  C4-C5: Minimal disc bulging. Uncovertebral joint hypertrophy and bilateral facet arthropathy. There is moderate left and mild right neural foraminal stenosis.Minimal spinal canal narrowing.  C5-C6: Minimal disc bulging and uncovertebral joint hypertrophy with bilateral facet arthropathy. Minimal spinal canal narrowing. There is moderate left and mild right neural foraminal narrowing.  C6-C7: Minimal disc bulging. Uncovertebral joint hypertrophy and bilateral facet arthropathy. No significant spinal canal stenosis. There is moderate left and mild-to-moderate right neural foraminal stenosis.  C7-T1: No significant spinal canal or neural foraminal narrowing.  IMPRESSION: Multilevel degenerative changes of the cervical spine, worst from C3-C7:  C3-C4: Moderate bilateral neural foraminal stenosis. No spinal canal stenosis.  C4-C5: Moderate left and mild right neural foraminal stenosis. Minimal spinal canal narrowing.  C5-C6: Moderate left and mild right neural foraminal stenosis. Minimal spinal canal narrowing.  C6-C7: Moderate left and mild-to-moderate right neural foraminal stenosis. No spinal canal stenosis.  Electronically Signed   By: Maurine Simmering M.D.   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 Diagnoses of Cervical radicular pain, Cervico-occipital neuralgia, Chronic right hip pain, Cervicalgia, and Chronic pain syndrome were also pertinent to  this visit.  Plan of Care  Patient is endorsing significant improvement in her left shoulder pain after glenohumeral joint injection.  She states that it is easier for her to raise her arm and perform ADLs.  She is pleased with the results  We also reviewed her cervical MRI which does show moderate multilevel cervical foraminal stenosis, bilaterally could also be  contributing to her symptoms and cervical occipital neuralgia.  Future considerations could include cervical epidural steroid injection.  Patient is complaining of severe right hip pain that is worse with weightbearing.  She states that she has had a left hip replacement already.  I will obtain a right hip x-ray and we have discussed a right hip intra-articular steroid injection to help with her pain.  Risk and benefits reviewed and patient would like to proceed with that.    Orders:  Orders Placed This Encounter  Procedures   HIP INJECTION    Standing Status:   Future    Standing Expiration Date:   02/10/2023    Scheduling Instructions:     Side: RIGHT     Sedation: po Valium     Timeframe: As soon as schedule allows   DG HIP UNILAT W OR W/O PELVIS 2-3 VIEWS RIGHT    Please describe any evidence of DJD, such as joint narrowing, asymmetry, cysts, or any anomalies in bone density, production, or erosion.    Standing Status:   Future    Standing Expiration Date:   12/12/2022    Scheduling Instructions:     Imaging must be done as soon as possible. Inform patient that order will expire within 30 days and I will not renew it.    Order Specific Question:   Reason for Exam (SYMPTOM  OR DIAGNOSIS REQUIRED)    Answer:   Right hip pain/arthralgia    Order Specific Question:   Preferred imaging location?    Answer:   [facility]    Order Specific Question:   Call Results- Best Contact Number?    Answer:   [phone]) [phone] ([facility])    Order Specific Question:   Release to patient    Answer:   Immediate   Follow-up plan:    Return in about 3 weeks (around 12/02/2022) for right hip IA injection , in clinic (PO Valium).    Recent Visits Date Type Provider Dept  10/05/22 Procedure visit Candace Santa, MD [facility]  09/22/22 Office Visit Candace Santa, MD [facility]  Showing recent visits within past 90 days and meeting all other requirements Today's Visits Date Type Provider Dept  11/11/22 Office Visit Candace Santa, MD [facility]  Showing today's visits and meeting all other requirements Future Appointments Date Type Provider Dept  01/12/23 Appointment Candace Santa, MD [facility]  Showing future appointments within next 90 days and meeting all other requirements  I discussed the assessment and treatment plan with the patient. The patient was provided an opportunity to ask questions and all were answered. The patient agreed with the plan and demonstrated an understanding of the instructions.  Patient advised to call back or seek an in-person evaluation if the symptoms or condition worsens.  Duration of encounter: 79mnutes.  Note by: BGillis Santa MD Date: 11/11/2022; Time: 2:16 PM

## 2022-11-12 ENCOUNTER — Telehealth: Payer: Self-pay | Admitting: *Deleted

## 2022-11-12 NOTE — Telephone Encounter (Signed)
Attempted to call patient to give pre procedure instructions. Left message.

## 2022-11-12 NOTE — Patient Instructions (Signed)
______________________________________________________________________  Preparing for your procedure  During your procedure appointment there will be: No Prescription Refills. No disability issues to discussed. No medication changes or discussions.  Instructions: Food intake: Avoid eating anything solid for at least 8 hours prior to your procedure. Clear liquid intake: You may take clear liquids such as water up to 2 hours prior to your procedure. (No carbonated drinks. No soda.) Transportation: Unless otherwise stated by your physician, bring a driver. Morning Medicines: Except for blood thinners, take all of your other morning medications with a sip of water. Make sure to take your heart and blood pressure medicines. If your blood pressure's lower number is above 100, the case will be rescheduled. Blood thinners: If you take a blood thinner, but were not instructed to stop it, call our office [phone] and ask to talk to a nurse. Not stopping a blood thinner prior to certain procedures could lead to serious complications. Diabetics on insulin: Notify the staff so that you can be scheduled 1st case in the morning. If your diabetes requires high dose insulin, take only  of your normal insulin dose the morning of the procedure and notify the staff that you have done so. Preventing infections: Shower with an antibacterial soap the morning of your procedure.  Build-up your immune system: Take 1000 mg of Vitamin C with every meal (3 times a day) the day prior to your procedure. Antibiotics: Inform the nursing staff if you are taking any antibiotics or if you have any conditions that may require antibiotics prior to procedures. (Example: recent joint implants)   Pregnancy: If you are pregnant make sure to notify the nursing staff. Not doing so may result in injury to the fetus, including death.  Sickness: If you have a cold, fever, or any active infections, call and cancel or reschedule your  procedure. Receiving steroids while having an infection may result in complications. Arrival: You must be in the facility at least 30 minutes prior to your scheduled procedure. Tardiness: Your scheduled time is also the cutoff time. If you do not arrive at least 15 minutes prior to your procedure, you will be rescheduled.  Children: Do not bring any children with you. Make arrangements to keep them home. Dress appropriately: There is always a possibility that your clothing may get soiled. Avoid long dresses. Valuables: Do not bring any jewelry or valuables.  Reasons to call and reschedule or cancel your procedure: (Following these recommendations will minimize the risk of a serious complication.) Surgeries: Avoid having procedures within 2 weeks of any surgery. (Avoid for 2 weeks before or after any surgery). Flu Shots: Avoid having procedures within 2 weeks of a flu shots or . (Avoid for 2 weeks before or after immunizations). Barium: Avoid having a procedure within 7-10 days after having had a radiological study involving the use of radiological contrast. (Myelograms, Barium swallow or enema study). Heart attacks: Avoid any elective procedures or surgeries for the initial 6 months after a "Myocardial Infarction" (Heart Attack). Blood thinners: It is imperative that you stop these medications before procedures. Let us know if you if you take any blood thinner.  Infection: Avoid procedures during or within two weeks of an infection (including chest colds or gastrointestinal problems). Symptoms associated with infections include: Localized redness, fever, chills, night sweats or profuse sweating, burning sensation when voiding, cough, congestion, stuffiness, runny nose, sore throat, diarrhea, nausea, vomiting, cold or Flu symptoms, recent or current infections. It is specially important if the infection is   over the area that we intend to treat. Heart and lung problems: Symptoms that may suggest an  active cardiopulmonary problem include: cough, chest pain, breathing difficulties or shortness of breath, dizziness, ankle swelling, uncontrolled high or unusually low blood pressure, and/or palpitations. If you are experiencing any of these symptoms, cancel your procedure and contact your primary care physician for an evaluation.  Remember:  Regular Business hours are:  Monday to Thursday 8:00 AM to 4:00 PM  Provider's Schedule: Francisco Naveira, MD:  Procedure days: Tuesday and Thursday 7:30 AM to 4:00 PM  Bilal Lateef, MD:  Procedure days: Monday and Wednesday 7:30 AM to 4:00 PM  ______________________________________________________________________    

## 2022-11-13 ENCOUNTER — Ambulatory Visit
Admission: RE | Admit: 2022-11-13 | Discharge: 2022-11-13 | Disposition: A | Discharge status: Home / Self Care | Payer: Medicare Other | Attending: Student in an Organized Health Care Education/Training Program | Admitting: Student in an Organized Health Care Education/Training Program

## 2022-11-13 ENCOUNTER — Ambulatory Visit
Admission: RE | Admit: 2022-11-13 | Discharge: 2022-11-13 | Disposition: A | Discharge status: Home / Self Care | Payer: Medicare Other | Source: Ambulatory Visit | Attending: Student in an Organized Health Care Education/Training Program | Admitting: Student in an Organized Health Care Education/Training Program

## 2022-11-13 DIAGNOSIS — G894 Chronic pain syndrome: Secondary | ICD-10-CM

## 2022-11-13 DIAGNOSIS — G8929 Other chronic pain: Secondary | ICD-10-CM

## 2022-11-13 DIAGNOSIS — M25551 Pain in right hip: Secondary | ICD-10-CM | POA: Diagnosis present

## 2022-12-02 ENCOUNTER — Encounter: Payer: Self-pay | Admitting: Student in an Organized Health Care Education/Training Program

## 2022-12-02 ENCOUNTER — Ambulatory Visit
Payer: Medicare Other | Attending: Student in an Organized Health Care Education/Training Program | Admitting: Student in an Organized Health Care Education/Training Program

## 2022-12-02 ENCOUNTER — Ambulatory Visit
Admission: RE | Admit: 2022-12-02 | Discharge: 2022-12-02 | Disposition: A | Discharge status: Home / Self Care | Payer: Medicare Other | Source: Ambulatory Visit | Attending: Student in an Organized Health Care Education/Training Program | Admitting: Student in an Organized Health Care Education/Training Program

## 2022-12-02 VITALS — BP 112/64 | HR 76 | Temp 97.1°F | Resp 12 | Ht 62.0 in | Wt 234.0 lb

## 2022-12-02 DIAGNOSIS — G8929 Other chronic pain: Secondary | ICD-10-CM | POA: Insufficient documentation

## 2022-12-02 DIAGNOSIS — M1611 Unilateral primary osteoarthritis, right hip: Secondary | ICD-10-CM | POA: Diagnosis present

## 2022-12-02 DIAGNOSIS — M25551 Pain in right hip: Secondary | ICD-10-CM | POA: Insufficient documentation

## 2022-12-02 MED ORDER — DIAZEPAM 5 MG PO TABS
ORAL_TABLET | ORAL | Status: AC
  Filled 2022-12-02: qty 1

## 2022-12-02 MED ORDER — DIAZEPAM 5 MG PO TABS
5.0000 mg | ORAL_TABLET | ORAL | Status: AC
  Administered 2022-12-02: 5 mg via ORAL

## 2022-12-02 MED ORDER — LIDOCAINE HCL 2 % IJ SOLN
INTRAMUSCULAR | Status: AC
  Filled 2022-12-02: qty 20

## 2022-12-02 MED ORDER — METHYLPREDNISOLONE ACETATE 40 MG/ML IJ SUSP
INTRAMUSCULAR | Status: AC
  Filled 2022-12-02: qty 1

## 2022-12-02 MED ORDER — ROPIVACAINE HCL 2 MG/ML IJ SOLN
4.0000 mL | Freq: Once | INTRAMUSCULAR | Status: AC
  Administered 2022-12-02: 4 mL via INTRA_ARTICULAR

## 2022-12-02 MED ORDER — ROPIVACAINE HCL 2 MG/ML IJ SOLN
INTRAMUSCULAR | Status: AC
  Filled 2022-12-02: qty 20

## 2022-12-02 MED ORDER — LIDOCAINE HCL 2 % IJ SOLN
20.0000 mL | Freq: Once | INTRAMUSCULAR | Status: AC
  Administered 2022-12-02: 400 mg

## 2022-12-02 MED ORDER — IOHEXOL 180 MG/ML  SOLN
INTRAMUSCULAR | Status: AC
  Filled 2022-12-02: qty 20

## 2022-12-02 MED ORDER — METHYLPREDNISOLONE ACETATE 40 MG/ML IJ SUSP
40.0000 mg | Freq: Once | INTRAMUSCULAR | Status: AC
  Administered 2022-12-02: 40 mg

## 2022-12-02 NOTE — Progress Notes (Signed)
Safety precautions to be maintained throughout the outpatient stay will include: orient to surroundings, keep bed in low position, maintain call bell within reach at all times, provide assistance with transfer out of bed and ambulation.  

## 2022-12-02 NOTE — Progress Notes (Signed)
PROVIDER NOTE: Interpretation of information contained herein should be left to medically-trained personnel. Specific patient instructions are provided elsewhere under "Patient Instructions" section of medical record. This document was created in part using STT-dictation technology, any transcriptional errors that may result from this process are unintentional.  Patient: Candace Juarez Type: Established DOB: 28-Jun-1951 MRN: [id number] PCP: Enid Baas, MD  Service: Procedure DOS: 12/02/2022 Setting: Ambulatory Location: Ambulatory outpatient facility Delivery: Face-to-face Provider: Edward Jolly, MD Specialty: Interventional Pain Management Specialty designation: 09 Location: Outpatient facility Ref. Prov.: Enid Baas, MD    Procedure:               Type:  Intra-articular hip injection  #1  Laterality: Right (-RT)  Approach: Percutaneous posterolateral approach. Level: Lower pelvic and hip joint level.  Imaging: Fluoroscopy-guided         Anesthesia: Local anesthesia (1-2% Lidocaine) Anxiolysis: Valium 5 mg PO Sedation: Minimal Sedation                       DOS: 12/02/2022  Performed by: Edward Jolly, MD  Purpose: Diagnostic/Therapeutic Indications: Hip pain severe enough to impact quality of life or function. Rationale (medical necessity): procedure needed and proper for the diagnosis and/or treatment of Candace Juarez's medical symptoms and needs. 1. Chronic right hip pain   2. Primary osteoarthritis of right hip    NAS-11 Pain score:   Pre-procedure: 6 /10   Post-procedure: 0-No pain/10      Target: Intra-articular hip joint Region: Hip joint, upper (proximal) femoral region Type of procedure: Percutaneous joint injection   Position / Prep / Materials:  Position: Supine  Prep solution: DuraPrep (Iodine Povacrylex [0.7% available iodine] and Isopropyl Alcohol, 74% w/w) Prep Area:  Entire Posterolateral hip area. Materials:  Tray: Block tray Needle(s):  Type:  Spinal  Gauge (G): 22  Length: 5-in    Pre-op H&P Assessment:  Candace Juarez is a 72 y.o. (year old), female patient, seen today for interventional treatment. She  has a past surgical history that includes Joint replacement (Left); Cholesteatoma excision (Right); vericose; Colonoscopy; Breast enhancement surgery; Cholecystectomy; Hernia repair; Tubal ligation; Total laparoscopic hysterectomy with bilateral salpingo oophorectomy (Bilateral, 10/24/2020); and Cystocele repair (N/A, 10/24/2020). Candace Juarez has a current medication list which includes the following prescription(s): abatacept, alprazolam, bupropion, cetirizine, cyclosporine, denosumab, diphenoxylate-atropine, estradiol, fluticasone, hydrocodone-acetaminophen, [START ON 12/21/2022] hydrocodone-acetaminophen, hydroxychloroquine, levothyroxine, losartan, metoprolol succinate, nystatin, polyethyl glycol-propyl glycol, torsemide, venlafaxine xr, aluminum-magnesium hydroxide-simethicone, bupropion, and prednisone. Her primarily concern today is the Back Pain (lower)  Initial Vital Signs:  Pulse/HCG Rate: 92  Temp: (!) 97.1 F (36.2 C) Resp: 18 BP: 134/70 SpO2: 100 %  BMI: Estimated body mass index is 42.8 kg/m as calculated from the following:   Height as of this encounter: 5\' 2"  (1.575 m).   Weight as of this encounter: 234 lb (106.1 kg).  Risk Assessment: Allergies: Reviewed. She is allergic to rifampin, sulfa antibiotics, nsaids, and sulfur.  Allergy Precautions: None required Coagulopathies: Reviewed. None identified.  Blood-thinner therapy: None at this time Active Infection(s): Reviewed. None identified. Candace Juarez is afebrile  Site Confirmation: Candace Juarez was asked to confirm the procedure and laterality before marking the site Procedure checklist: Completed Consent: Before the procedure and under the influence of no sedative(s), amnesic(s), or anxiolytics, the patient was informed of the treatment options, risks and possible  complications. To fulfill our ethical and legal obligations, as recommended by the American Medical Association's Code of Ethics, I have informed the patient  of my clinical impression; the nature and purpose of the treatment or procedure; the risks, benefits, and possible complications of the intervention; the alternatives, including doing nothing; the risk(s) and benefit(s) of the alternative treatment(s) or procedure(s); and the risk(s) and benefit(s) of doing nothing. The patient was provided information about the general risks and possible complications associated with the procedure. These may include, but are not limited to: failure to achieve desired goals, infection, bleeding, organ or nerve damage, allergic reactions, paralysis, and death. In addition, the patient was informed of those risks and complications associated to the procedure, such as failure to decrease pain; infection; bleeding; organ or nerve damage with subsequent damage to sensory, motor, and/or autonomic systems, resulting in permanent pain, numbness, and/or weakness of one or several areas of the body; allergic reactions; (i.e.: anaphylactic reaction); and/or death. Furthermore, the patient was informed of those risks and complications associated with the medications. These include, but are not limited to: allergic reactions (i.e.: anaphylactic or anaphylactoid reaction(s)); adrenal axis suppression; blood sugar elevation that in diabetics may result in ketoacidosis or comma; water retention that in patients with history of congestive heart failure may result in shortness of breath, pulmonary edema, and decompensation with resultant heart failure; weight gain; swelling or edema; medication-induced neural toxicity; particulate matter embolism and blood vessel occlusion with resultant organ, and/or nervous system infarction; and/or aseptic necrosis of one or more joints. Finally, the patient was informed that Medicine is not an exact  science; therefore, there is also the possibility of unforeseen or unpredictable risks and/or possible complications that may result in a catastrophic outcome. The patient indicated having understood very clearly. We have given the patient no guarantees and we have made no promises. Enough time was given to the patient to ask questions, all of which were answered to the patient's satisfaction. Ms. Scarfone has indicated that she wanted to continue with the procedure. Attestation: I, the ordering provider, attest that I have discussed with the patient the benefits, risks, side-effects, alternatives, likelihood of achieving goals, and potential problems during recovery for the procedure that I have provided informed consent. Date  Time: 12/02/2022 11:16 AM  Pre-Procedure Preparation:  Monitoring: As per clinic protocol. Respiration, ETCO2, SpO2, BP, heart rate and rhythm monitor placed and checked for adequate function Safety Precautions: Patient was assessed for positional comfort and pressure points before starting the procedure. Time-out: I initiated and conducted the "Time-out" before starting the procedure, as per protocol. The patient was asked to participate by confirming the accuracy of the "Time Out" information. Verification of the correct person, site, and procedure were performed and confirmed by me, the nursing staff, and the patient. "Time-out" conducted as per Joint Commission's Universal Protocol (UP.01.01.01). Time: 1206  Description/Narrative of Procedure:          Rationale (medical necessity): procedure needed and proper for the diagnosis and/or treatment of the patient's medical symptoms and needs. Procedural Technique Safety Precautions: Aspiration looking for blood return was conducted prior to all injections. At no point did we inject any substances, as a needle was being advanced. No attempts were made at seeking any paresthesias. Safe injection practices and needle disposal techniques  used. Medications properly checked for expiration dates. SDV (single dose vial) medications used. Description of the Procedure: Protocol guidelines were followed. The patient was assisted into a comfortable position. The target area was identified and the area prepped in the usual manner. Skin & deeper tissues infiltrated with local anesthetic. Appropriate amount of time allowed to  pass for local anesthetics to take effect. The procedure needles were then advanced to the target area. Proper needle placement secured. Negative aspiration confirmed. Solution injected in intermittent fashion, asking for systemic symptoms every 0.5cc of injectate. The needles were then removed and the area cleansed, making sure to leave some of the prepping solution back to take advantage of its long term bactericidal properties.  Technical description of procedure:  Skin & deeper tissues infiltrated with local anesthetic. Appropriate amount of time allowed to pass for local anesthetics to take effect. The procedure needles were then advanced to the target area. Proper needle placement secured. Negative aspiration confirmed. Solution injected in intermittent fashion, asking for systemic symptoms every 0.5cc of injectate. The needles were then removed and the area cleansed, making sure to leave some of the prepping solution back to take advantage of its long term bactericidal properties.  5  cc solution made of 4cc of 0.2% ropivacaine, 1 cc of methylprednisolone, 40 mg/cc injected into right hip under live fluoroscopy after contrast confirmation        Vitals:   12/02/22 1145 12/02/22 1155 12/02/22 1205 12/02/22 1217  BP: (!) 118/98 120/74 111/88 112/64  Pulse: 77 76 75 76  Resp: 18 16 11 12   Temp:      TempSrc:      SpO2: 99% 99% 98% 98%  Weight:      Height:         Start Time: 1206 hrs. End Time: 1214 hrs.  Imaging Guidance (Non-Spinal):          Type of Imaging Technique: Fluoroscopy Guidance  (Non-Spinal) Indication(s): Assistance in needle guidance and placement for procedures requiring needle placement in or near specific anatomical locations not easily accessible without such assistance. Exposure Time: Please see nurses notes. Contrast: Before injecting any contrast, we confirmed that the patient did not have an allergy to iodine, shellfish, or radiological contrast. Once satisfactory needle placement was completed at the desired level, radiological contrast was injected. Contrast injected under live fluoroscopy. No contrast complications. See chart for type and volume of contrast used. Fluoroscopic Guidance: I was personally present during the use of fluoroscopy. "Tunnel Vision Technique" used to obtain the best possible view of the target area. Parallax error corrected before commencing the procedure. "Direction-depth-direction" technique used to introduce the needle under continuous pulsed fluoroscopy. Once target was reached, antero-posterior, oblique, and lateral fluoroscopic projection used confirm needle placement in all planes. Images permanently stored in EMR. Interpretation: I personally interpreted the imaging intraoperatively. Adequate needle placement confirmed in multiple planes. Appropriate spread of contrast into desired area was observed. No evidence of afferent or efferent intravascular uptake. Permanent images saved into the patient's record.  Post-operative Assessment:  Post-procedure Vital Signs:  Pulse/HCG Rate: 76  Temp: (!) 97.1 F (36.2 C) Resp: 12 BP: 112/64 SpO2: 98 %  EBL: None  Complications: No immediate post-treatment complications observed by team, or reported by patient.  Note: The patient tolerated the entire procedure well. A repeat set of vitals were taken after the procedure and the patient was kept under observation following institutional policy, for this type of procedure. Post-procedural neurological assessment was performed, showing return  to baseline, prior to discharge. The patient was provided with post-procedure discharge instructions, including a section on how to identify potential problems. Should any problems arise concerning this procedure, the patient was given instructions to immediately contact , at any time, without hesitation. In any case, we plan to contact the patient by telephone for  a follow-up status report regarding this interventional procedure.  Comments:  No additional relevant information.  Plan of Care  Orders:  Orders Placed This Encounter  Procedures   DG PA[facility] C-ARM 1-60 MIN NO REPORT    Intraoperative interpretation by procedural physician at [facility].    Standing Status:   Standing    Number of Occurrences:   1    Order Specific Question:   Reason for exam:    Answer:   Assistance in needle guidance and placement for procedures requiring needle placement in or near specific anatomical locations not easily accessible without such assistance.   Chronic Opioid Analgesic:  Norco 7.5 mg 3 times daily as needed, quantity 90/month; MME equals 22.5     Medications ordered for procedure: Meds ordered this encounter  Medications   lidocaine (XYLOCAINE) 2 % (with pres) injection 400 mg   diazepam (VALIUM) tablet 5 mg    Make sure Flumazenil is available in the pyxis when using this medication. If oversedation occurs, administer 0.2 mg IV over 15 sec. If after 45 sec no response, administer 0.2 mg again over 1 min; may repeat at 1 min intervals; not to exceed 4 doses (1 mg)   ropivacaine (PF) 2 mg/mL (0.2%) (NAROPIN) injection 4 mL   methylPREDNISolone acetate (DEPO-MEDROL) injection 40 mg   Medications administered: We administered lidocaine, diazepam, ropivacaine (PF) 2 mg/mL (0.2%), and methylPREDNISolone acetate.  See the medical record for exact dosing, route, and time of administration.  Follow-up plan:   Return in about 4 weeks (around 12/30/2022) for Post Procedure  Evaluation, virtual.      Recent Visits Date Type Provider Dept  11/11/22 Office Visit Edward Jolly, MD [facility]  10/05/22 Procedure visit Edward Jolly, MD [facility]  09/22/22 Office Visit Edward Jolly, MD [facility]  Showing recent visits within past 90 days and meeting all other requirements Today's Visits Date Type Provider Dept  12/02/22 Procedure visit Edward Jolly, MD [facility]  Showing today's visits and meeting all other requirements Future Appointments Date Type Provider Dept  12/16/22 Appointment Edward Jolly, MD [facility]  01/12/23 Appointment Edward Jolly, MD [facility]  Showing future appointments within next 90 days and meeting all other requirements  Disposition: Discharge home  Discharge (Date  Time): 12/02/2022; 1221 hrs.   Primary Care Physician: Enid Baas, MD Location: [facility] Note by: Edward Jolly, MD Date: 12/02/2022; Time: 2:05 PM  Disclaimer:  Medicine is not an exact science. The only guarantee in medicine is that nothing is guaranteed. It is important to note that the decision to proceed with this intervention was based on the information collected from the patient. The Data and conclusions were drawn from the patient's questionnaire, the interview, and the physical examination. Because the information was provided in large part by the patient, it cannot be guaranteed that it has not been purposely or unconsciously manipulated. Every effort has been made to obtain as much relevant data as possible for this evaluation. It is important to note that the conclusions that lead to this procedure are derived in large part from the available data. Always take into account that the treatment will also be dependent on availability of resources and existing treatment guidelines, considered by other Pain Management Practitioners as being common knowledge and  practice, at the time of the intervention. For Medico-Legal purposes, it is also important to point out that variation in procedural techniques and  pharmacological choices are the acceptable norm. The indications, contraindications, technique, and results of the above procedure should only be interpreted and judged by a Board-Certified Interventional Pain Specialist with extensive familiarity and expertise in the same exact procedure and technique.

## 2022-12-02 NOTE — Patient Instructions (Signed)

## 2022-12-03 ENCOUNTER — Telehealth: Payer: Self-pay

## 2022-12-03 NOTE — Telephone Encounter (Signed)
Post procedure follow up.  LM 

## 2022-12-16 ENCOUNTER — Encounter: Payer: Self-pay | Admitting: Student in an Organized Health Care Education/Training Program

## 2022-12-16 ENCOUNTER — Ambulatory Visit
Payer: Medicare Other | Attending: Student in an Organized Health Care Education/Training Program | Admitting: Student in an Organized Health Care Education/Training Program

## 2022-12-16 VITALS — BP 140/63 | HR 73 | Temp 98.8°F | Resp 16 | Ht 62.0 in | Wt 234.0 lb

## 2022-12-16 DIAGNOSIS — M25551 Pain in right hip: Secondary | ICD-10-CM | POA: Insufficient documentation

## 2022-12-16 DIAGNOSIS — M1611 Unilateral primary osteoarthritis, right hip: Secondary | ICD-10-CM

## 2022-12-16 DIAGNOSIS — G894 Chronic pain syndrome: Secondary | ICD-10-CM | POA: Insufficient documentation

## 2022-12-16 DIAGNOSIS — G8929 Other chronic pain: Secondary | ICD-10-CM | POA: Insufficient documentation

## 2022-12-16 NOTE — Patient Instructions (Signed)

## 2022-12-16 NOTE — Progress Notes (Signed)
PROVIDER NOTE: Information contained herein reflects review and annotations entered in association with encounter. Interpretation of such information and data should be left to medically-trained personnel. Information provided to patient can be located elsewhere in the medical record under "Patient Instructions". Document created using STT-dictation technology, any transcriptional errors that may result from process are unintentional.    Patient: Candace Juarez  Service Category: E/M  Provider: Gillis Santa, MD  DOB: 18-Jun-1951  DOS: 12/16/2022  Referring Provider: Gladstone Lighter, MD  MRN: [id number]  Specialty: Interventional Pain Management  PCP: Gladstone Lighter, MD  Type: Established Patient  Setting: Ambulatory outpatient    Location: Office  Delivery: Face-to-face     HPI  [name], a 72 y.o. year old female, is here today because of her Chronic right hip pain [M25.551, G89.29]. [name] primary complain today is Hip Pain Last encounter: My last encounter with her was on 12/02/2022. Pertinent problems: [name] has Polyarthralgia; History of left hip replacement; H/O right knee surgery; Chronic pain of both knees; Chronic right hip pain; Chronic pain syndrome; and Pain management contract signed on their pertinent problem list. Pain Assessment: Severity of Chronic pain is reported as a 3 /10. Location: Hip Right/radiates around to groin area, will sometimes radiate down to knee. Patient is able to bend knee better. Onset: More than a month ago. Quality: Aching, Radiating (pinching). Timing: Intermittent. Modifying factor(s): meds, heat, procedure, rest. Vitals:  height is 5\' 2"  (1.575 m) and weight is 234 lb (106.1 kg). Her temperature is 98.8 F (37.1 C). Her blood pressure is 140/63 (abnormal) and her pulse is 73. Her respiration is 16 and oxygen saturation is 99%.  BMI: Estimated body mass index is 42.8 kg/m as calculated from the following:   Height as of this encounter: 5\' 2"   (1.575 m).   Weight as of this encounter: 234 lb (106.1 kg).  Reason for encounter: post-procedure evaluation and assessment.    Post-procedure evaluation   Type:  Intra-articular hip injection  #1  Laterality: Right (-RT)  Approach: Percutaneous posterolateral approach. Level: Lower pelvic and hip joint level.  Imaging: Fluoroscopy-guided         Anesthesia: Local anesthesia (1-2% Lidocaine) Anxiolysis: Valium 5 mg PO Sedation: Minimal Sedation                       DOS: 12/02/2022  Performed by: Gillis Santa, MD  Purpose: Diagnostic/Therapeutic Indications: Hip pain severe enough to impact quality of life or function. Rationale (medical necessity): procedure needed and proper for the diagnosis and/or treatment of [name] medical symptoms and needs. 1. Chronic right hip pain   2. Primary osteoarthritis of right hip    NAS-11 Pain score:   Pre-procedure: 6 /10   Post-procedure: 0-No pain/10       Effectiveness:  Initial hour after procedure: 100 %  Subsequent 4-6 hours post-procedure: 80 %. Analgesia past initial 6 hours: 50 % (current)  Ongoing improvement:  Analgesic:  50% Function: Somewhat improved ROM: Somewhat improved   Pharmacotherapy Assessment  Analgesic: Norco 7.5 mg 3 times daily as needed, quantity 90/month; MME equals 22.5     Monitoring: [place] PMP: PDMP reviewed during this encounter.       Pharmacotherapy: No side-effects or adverse reactions reported. Compliance: No problems identified. Effectiveness: Clinically acceptable.  Ignatius Specking, RN  12/16/2022  1:52 PM  Sign when Signing Visit Safety precautions to be maintained throughout the outpatient stay will include: orient to surroundings,  keep bed in low position, maintain call bell within reach at all times, provide assistance with transfer out of bed and ambulation.     No results found for: "CBDTHCR" No results found for: "D8THCCBX" No results found for: "D9THCCBX"  UDS:  Summary  Date  Value Ref Range Status  09/22/2022 Note  Final    Comment:    ==================================================================== Compliance Drug Analysis, Ur ==================================================================== Test                             Result       Flag       Units  Drug Present and Declared for Prescription Verification   Alprazolam                     148          EXPECTED   ng/mg creat   Alpha-hydroxyalprazolam        130          EXPECTED   ng/mg creat    Source of alprazolam is a scheduled prescription medication. Alpha-    hydroxyalprazolam is an expected metabolite of alprazolam.    Hydrocodone                    3065         EXPECTED   ng/mg creat   Hydromorphone                  93           EXPECTED   ng/mg creat   Dihydrocodeine                 430          EXPECTED   ng/mg creat   Norhydrocodone                 3508         EXPECTED   ng/mg creat    Sources of hydrocodone include scheduled prescription medications.    Hydromorphone, dihydrocodeine and norhydrocodone are expected    metabolites of hydrocodone. Hydromorphone and dihydrocodeine are    also available as scheduled prescription medications.    Bupropion                      PRESENT      EXPECTED   Hydroxybupropion               PRESENT      EXPECTED    Hydroxybupropion is an expected metabolite of bupropion.    Venlafaxine                    PRESENT      EXPECTED   Desmethylvenlafaxine           PRESENT      EXPECTED    Desmethylvenlafaxine is an expected metabolite of venlafaxine.    Acetaminophen                  PRESENT      EXPECTED   Metoprolol                     PRESENT      EXPECTED  Drug Present not Declared for Prescription Verification   Ephedrine/Pseudoephedrine      PRESENT      UNEXPECTED   Phenylpropanolamine  PRESENT      UNEXPECTED    Source of ephedrine/pseudoephedrine is most commonly pseudoephedrine    in over-the-counter or prescription cold and  allergy medications.    Phenylpropanolamine is an expected metabolite of    ephedrine/pseudoephedrine.  ==================================================================== Test                      Result    Flag   Units      Ref Range   Creatinine              60               mg/dL      >=20 ==================================================================== Declared Medications:  The flagging and interpretation on this report are based on the  following declared medications.  Unexpected results may arise from  inaccuracies in the declared medications.   **Note: The testing scope of this panel includes these medications:   Alprazolam (Xanax)  Bupropion (Wellbutrin)  Hydrocodone (Norco)  Metoprolol  Venlafaxine (Effexor)   **Note: The testing scope of this panel does not include small to  moderate amounts of these reported medications:   Acetaminophen (Norco)   **Note: The testing scope of this panel does not include the  following reported medications:   Abatacept  Aluminum Hydroxide (Maalox)  Atropine (Lomotil)  Cetirizine (Zyrtec)  Cyclosporine (Restasis)  Denosumab (Prolia)  Diphenoxylate (Lomotil)  Estradiol (Estrace)  Fluticasone (Flonase)  Hydroxychloroquine (Plaquenil)  Levothyroxine (Synthroid)  Magnesium Hydroxide (Maalox)  Nystatin (Mycostatin)  Polyethylene Glycol  Prednisone  Torsemide (Demadex) ==================================================================== For clinical consultation, please call [phone]. ====================================================================       ROS  Constitutional: Denies any fever or chills Gastrointestinal: No reported hemesis, hematochezia, vomiting, or acute GI distress Musculoskeletal:  Right hip pain, improved after injection Neurological: No reported episodes of acute onset apraxia, aphasia, dysarthria, agnosia, amnesia, paralysis, loss of coordination, or loss of consciousness  Medication  Review  ALPRAZolam, Abatacept, HYDROcodone-acetaminophen, Metoprolol Succinate, Polyethyl Glycol-Propyl Glycol, aluminum-magnesium hydroxide-simethicone, buPROPion, cetirizine, cycloSPORINE, denosumab, diphenoxylate-atropine, estradiol, fluticasone, hydroxychloroquine, levothyroxine, losartan, nystatin, predniSONE, torsemide, and venlafaxine XR  History Review  Allergy: [name] is allergic to rifampin, sulfa antibiotics, nsaids, and sulfur. Drug: [name]  reports no history of drug use. Alcohol:  reports no history of alcohol use. Tobacco:  reports that she has never smoked. She has never used smokeless tobacco. Social: [name]  reports that she has never smoked. She has never used smokeless tobacco. She reports that she does not drink alcohol and does not use drugs. Medical:  has a past medical history of Arthritis, CHF (congestive heart failure) ([place]), History of hiatal hernia, Hypothyroidism, Lupus ([place]), Rheumatoid arthritis ([place]), and Rosacea. Surgical: [name]  has a past surgical history that includes Joint replacement (Left); Cholesteatoma excision (Right); vericose; Colonoscopy; Breast enhancement surgery; Cholecystectomy; Hernia repair; Tubal ligation; Total laparoscopic hysterectomy with bilateral salpingo oophorectomy (Bilateral, 10/24/2020); and Cystocele repair (N/A, 10/24/2020). Family: family history is not on file.  Laboratory Chemistry Profile   Renal Lab Results  Component Value Date   BUN 16 10/25/2020   CREATININE 0.87 10/25/2020   GFRNONAA >60 10/25/2020    Hepatic Lab Results  Component Value Date   AST 31 12/17/2020   ALT 22 12/17/2020   ALBUMIN 4.2 12/17/2020   ALKPHOS 71 12/17/2020   LIPASE 28 09/06/2020    Electrolytes Lab Results  Component Value Date   NA 136 10/25/2020   K 4.2 10/25/2020   CL 100 10/25/2020  CALCIUM 8.3 (L) 10/25/2020    Bone No results found for: "VD25OH", "VD125OH2TOT", "IR4431VQ0", "GQ6761PJ0", "25OHVITD1", "25OHVITD2",  "25OHVITD3", "TESTOFREE", "TESTOSTERONE"  Inflammation (CRP: Acute Phase) (ESR: Chronic Phase) No results found for: "CRP", "ESRSEDRATE", "LATICACIDVEN"       Note: Above Lab results reviewed.  Recent Imaging Review  DG PAIN CLINIC C-ARM 1-60 MIN NO REPORT Fluoro was used, but no Radiologist interpretation will be provided.  Please refer to "NOTES" tab for provider progress note. Note: Reviewed        Physical Exam  General appearance: Well nourished, well developed, and well hydrated. In no apparent acute distress Mental status: Alert, oriented x 3 (person, place, & time)       Respiratory: No evidence of acute respiratory distress Eyes: PERLA Vitals: BP (!) 140/63   Pulse 73   Temp 98.8 F (37.1 C)   Resp 16   Ht 5\' 2"  (1.575 m)   Wt 234 lb (106.1 kg)   SpO2 99%   BMI 42.80 kg/m  BMI: Estimated body mass index is 42.8 kg/m as calculated from the following:   Height as of this encounter: 5\' 2"  (1.575 m).   Weight as of this encounter: 234 lb (106.1 kg). Ideal: Ideal body weight: 50.1 kg (110 lb 7.2 oz) Adjusted ideal body weight: 72.5 kg (159 lb 13.9 oz)  Assessment   Diagnosis Status  1. Chronic right hip pain   2. Primary osteoarthritis of right hip   3. Chronic pain syndrome    Improved Persistent Controlled   Updated Problems: Problem  Primary Osteoarthritis of Right Hip    Plan of Care  Positive response to right hip injection for right hip osteoarthritis.  Endorses 50% ongoing pain relief.  States that she would still like to meet with orthopedics to discuss potential surgery which I informed her may be a good option to at least establish care with them even if she does not need to have a right away.  Follow-up for medication management next month as scheduled.   Follow-up plan:   Return for Keep sch. appt.    Recent Visits Date Type Provider Dept  12/02/22 Procedure visit , MD [facility]  11/11/22 Office Visit Edward Jolly,  MD [facility]  10/05/22 Procedure visit Edward Jolly, MD [facility]  09/22/22 Office Visit Edward Jolly, MD [facility]  Showing recent visits within past 90 days and meeting all other requirements Today's Visits Date Type Provider Dept  12/16/22 Office Visit Edward Jolly, MD [facility]  Showing today's visits and meeting all other requirements Future Appointments Date Type Provider Dept  01/12/23 Appointment Edward Jolly, MD [facility]  Showing future appointments within next 90 days and meeting all other requirements  I discussed the assessment and treatment plan with the patient. The patient was provided an opportunity to ask questions and all were answered. The patient agreed with the plan and demonstrated an understanding of the instructions.  Patient advised to call back or seek an in-person evaluation if the symptoms or condition worsens.  Duration of encounter: 03/14/23.  Total time on encounter, as per AMA guidelines included both the face-to-face and non-face-to-face time personally spent by the physician and/or other qualified health care professional(s) on the day of the encounter (includes time in activities that require the physician or other qualified health care professional and does not include time in activities normally performed by clinical staff). Physician's time may include the following activities when  performed: Preparing to see the patient (e.g., pre-charting review of records, searching for previously ordered imaging, lab work, and nerve conduction tests) Review of prior analgesic pharmacotherapies. Reviewing PMP Interpreting ordered tests (e.g., lab work, imaging, nerve conduction tests) Performing post-procedure evaluations, including interpretation of diagnostic procedures Obtaining and/or reviewing separately obtained history Performing a medically appropriate examination and/or evaluation Counseling  and educating the patient/family/caregiver Ordering medications, tests, or procedures Referring and communicating with other health care professionals (when not separately reported) Documenting clinical information in the electronic or other health record Independently interpreting results (not separately reported) and communicating results to the patient/ family/caregiver Care coordination (not separately reported)  Note by: Gillis Santa, MD Date: 12/16/2022; Time: 2:18 PM

## 2022-12-16 NOTE — Progress Notes (Signed)
Safety precautions to be maintained throughout the outpatient stay will include: orient to surroundings, keep bed in low position, maintain call bell within reach at all times, provide assistance with transfer out of bed and ambulation.  

## 2023-01-12 ENCOUNTER — Encounter: Payer: Self-pay | Admitting: Student in an Organized Health Care Education/Training Program

## 2023-01-12 ENCOUNTER — Ambulatory Visit
Payer: Medicare Other | Attending: Student in an Organized Health Care Education/Training Program | Admitting: Student in an Organized Health Care Education/Training Program

## 2023-01-12 VITALS — BP 134/62 | HR 68 | Temp 98.2°F | Resp 16 | Ht 62.0 in | Wt 235.0 lb

## 2023-01-12 DIAGNOSIS — M25551 Pain in right hip: Secondary | ICD-10-CM

## 2023-01-12 DIAGNOSIS — M1611 Unilateral primary osteoarthritis, right hip: Secondary | ICD-10-CM | POA: Diagnosis present

## 2023-01-12 DIAGNOSIS — G8929 Other chronic pain: Secondary | ICD-10-CM | POA: Insufficient documentation

## 2023-01-12 DIAGNOSIS — M5481 Occipital neuralgia: Secondary | ICD-10-CM

## 2023-01-12 DIAGNOSIS — Z0289 Encounter for other administrative examinations: Secondary | ICD-10-CM | POA: Diagnosis present

## 2023-01-12 DIAGNOSIS — G894 Chronic pain syndrome: Secondary | ICD-10-CM | POA: Diagnosis present

## 2023-01-12 DIAGNOSIS — M542 Cervicalgia: Secondary | ICD-10-CM

## 2023-01-12 DIAGNOSIS — M5412 Radiculopathy, cervical region: Secondary | ICD-10-CM

## 2023-01-12 DIAGNOSIS — M069 Rheumatoid arthritis, unspecified: Secondary | ICD-10-CM | POA: Diagnosis present

## 2023-01-12 MED ORDER — HYDROCODONE-ACETAMINOPHEN 7.5-325 MG PO TABS: 1.0000 | ORAL_TABLET | Freq: Three times a day (TID) | ORAL | 0 refills | Status: AC | PRN

## 2023-01-12 MED ORDER — HYDROCODONE-ACETAMINOPHEN 7.5-325 MG PO TABS: 1.0000 | ORAL_TABLET | Freq: Three times a day (TID) | ORAL | 0 refills | Status: DC | PRN

## 2023-01-12 NOTE — Patient Instructions (Signed)
____________________________________________________________________________________________  General Risks and Possible Complications  Patient Responsibilities: It is important that you read this as it is part of your informed consent. It is our duty to inform you of the risks and possible complications associated with treatments offered to you. It is your responsibility as a patient to read this and to ask questions about anything that is not clear or that you believe was not covered in this document.  Patient's Rights: You have the right to refuse treatment. You also have the right to change your mind, even after initially having agreed to have the treatment done. However, under this last option, if you wait until the last second to change your mind, you may be charged for the materials used up to that point.  Introduction: Medicine is not an Chief Strategy Officer. Everything in Medicine, including the lack of treatment(s), carries the potential for danger, harm, or loss (which is by definition: Risk). In Medicine, a complication is a secondary problem, condition, or disease that can aggravate an already existing one. All treatments carry the risk of possible complications. The fact that a side effects or complications occurs, does not imply that the treatment was conducted incorrectly. It must be clearly understood that these can happen even when everything is done following the highest safety standards.  No treatment: You can choose not to proceed with the proposed treatment alternative. The "PRO(s)" would include: avoiding the risk of complications associated with the therapy. The "CON(s)" would include: not getting any of the treatment benefits. These benefits fall under one of three categories: diagnostic; therapeutic; and/or palliative. Diagnostic benefits include: getting information which can ultimately lead to improvement of the disease or symptom(s). Therapeutic benefits are those associated with  the successful treatment of the disease. Finally, palliative benefits are those related to the decrease of the primary symptoms, without necessarily curing the condition (example: decreasing the pain from a flare-up of a chronic condition, such as incurable terminal cancer).  General Risks and Complications: These are associated to most interventional treatments. They can occur alone, or in combination. They fall under one of the following six (6) categories: no benefit or worsening of symptoms; bleeding; infection; nerve damage; allergic reactions; and/or death. No benefits or worsening of symptoms: In Medicine there are no guarantees, only probabilities. No healthcare provider can ever guarantee that a medical treatment will work, they can only state the probability that it may. Furthermore, there is always the possibility that the condition may worsen, either directly, or indirectly, as a consequence of the treatment. Bleeding: This is more common if the patient is taking a blood thinner, either prescription or over the counter (example: Goody Powders, Fish oil, Aspirin, Garlic, etc.), or if suffering a condition associated with impaired coagulation (example: Hemophilia, cirrhosis of the liver, low platelet counts, etc.). However, even if you do not have one on these, it can still happen. If you have any of these conditions, or take one of these drugs, make sure to notify your treating physician. Infection: This is more common in patients with a compromised immune system, either due to disease (example: diabetes, cancer, human immunodeficiency virus [HIV], etc.), or due to medications or treatments (example: therapies used to treat cancer and rheumatological diseases). However, even if you do not have one on these, it can still happen. If you have any of these conditions, or take one of these drugs, make sure to notify your treating physician. Nerve Damage: This is more common when the treatment is an  invasive one, but it can also happen with the use of medications, such as those used in the treatment of cancer. The damage can occur to small secondary nerves, or to large primary ones, such as those in the spinal cord and brain. This damage may be temporary or permanent and it may lead to impairments that can range from temporary numbness to permanent paralysis and/or brain death. Allergic Reactions: Any time a substance or material comes in contact with our body, there is the possibility of an allergic reaction. These can range from a mild skin rash (contact dermatitis) to a severe systemic reaction (anaphylactic reaction), which can result in death. Death: In general, any medical intervention can result in death, most of the time due to an unforeseen complication. ____________________________________________________________________________________________    ______________________________________________________________________  Preparing for your procedure  Appointments: If you think you may not be able to keep your appointment, call 24-48 hours in advance to cancel. We need time to make it available to others.  During your procedure appointment there will be: No Prescription Refills. No disability issues to discussed. No medication changes or discussions.  Instructions: Food intake: Avoid eating anything solid for at least 8 hours prior to your procedure. Clear liquid intake: You may take clear liquids such as water up to 2 hours prior to your procedure. (No carbonated drinks. No soda.) Transportation: Unless otherwise stated by your physician, bring a driver. Morning Medicines: Except for blood thinners, take all of your other morning medications with a sip of water. Make sure to take your heart and blood pressure medicines. If your blood pressure's lower number is above 100, the case will be rescheduled. Blood thinners: Make sure to stop your blood thinners as instructed.  If you take  a blood thinner, but were not instructed to stop it, call our office [phone] and ask to talk to a nurse. Not stopping a blood thinner prior to certain procedures could lead to serious complications. Diabetics on insulin: Notify the staff so that you can be scheduled 1st case in the morning. If your diabetes requires high dose insulin, take only  of your normal insulin dose the morning of the procedure and notify the staff that you have done so. Preventing infections: Shower with an antibacterial soap the morning of your procedure.  Build-up your immune system: Take 1000 mg of Vitamin C with every meal (3 times a day) the day prior to your procedure. Antibiotics: Inform the nursing staff if you are taking any antibiotics or if you have any conditions that may require antibiotics prior to procedures. (Example: recent joint implants)   Pregnancy: If you are pregnant make sure to notify the nursing staff. Not doing so may result in injury to the fetus, including death.  Sickness: If you have a cold, fever, or any active infections, call and cancel or reschedule your procedure. Receiving steroids while having an infection may result in complications. Arrival: You must be in the facility at least 30 minutes prior to your scheduled procedure. Tardiness: Your scheduled time is also the cutoff time. If you do not arrive at least 15 minutes prior to your procedure, you will be rescheduled.  Children: Do not bring any children with you. Make arrangements to keep them home. Dress appropriately: There is always a possibility that your clothing may get soiled. Avoid long dresses. Valuables: Do not bring any jewelry or valuables.  Reasons to call and reschedule or cancel your procedure: (Following these recommendations will minimize the risk  of a serious complication.) Surgeries: Avoid having procedures within 2 weeks of any surgery. (Avoid for 2 weeks before or after any surgery). Flu Shots: Avoid having  procedures within 2 weeks of a flu shots or . (Avoid for 2 weeks before or after immunizations). Barium: Avoid having a procedure within 7-10 days after having had a radiological study involving the use of radiological contrast. (Myelograms, Barium swallow or enema study). Heart attacks: Avoid any elective procedures or surgeries for the initial 6 months after a "Myocardial Infarction" (Heart Attack). Blood thinners: It is imperative that you stop these medications before procedures. Let us know if you if you take any blood thinner.  Infection: Avoid procedures during or within two weeks of an infection (including chest colds or gastrointestinal problems). Symptoms associated with infections include: Localized redness, fever, chills, night sweats or profuse sweating, burning sensation when voiding, cough, congestion, stuffiness, runny nose, sore throat, diarrhea, nausea, vomiting, cold or Flu symptoms, recent or current infections. It is specially important if the infection is over the area that we intend to treat. Heart and lung problems: Symptoms that may suggest an active cardiopulmonary problem include: cough, chest pain, breathing difficulties or shortness of breath, dizziness, ankle swelling, uncontrolled high or unusually low blood pressure, and/or palpitations. If you are experiencing any of these symptoms, cancel your procedure and contact your primary care physician for an evaluation.  Remember:  Regular Business hours are:  Monday to Thursday 8:00 AM to 4:00 PM  Provider's Schedule: Milinda Pointer, MD:  Procedure days: Tuesday and Thursday 7:30 AM to 4:00 PM  Gillis Santa, MD:  Procedure days: Monday and Wednesday 7:30 AM to 4:00 PM  ______________________________________________________________________   Epidural Steroid Injection Patient Information  Description: The epidural space surrounds the nerves as they exit the spinal cord.  In some patients, the nerves can be  compressed and inflamed by a bulging disc or a tight spinal canal (spinal stenosis).  By injecting steroids into the epidural space, we can bring irritated nerves into direct contact with a potentially helpful medication.  These steroids act directly on the irritated nerves and can reduce swelling and inflammation which often leads to decreased pain.  Epidural steroids may be injected anywhere along the spine and from the neck to the low back depending upon the location of your pain.   After numbing the skin with local anesthetic (like Novocaine), a small needle is passed into the epidural space slowly.  You may experience a sensation of pressure while this is being done.  The entire block usually last less than 10 minutes.  Conditions which may be treated by epidural steroids:  Low back and leg pain Neck and arm pain Spinal stenosis Post-laminectomy syndrome Herpes zoster (shingles) pain Pain from compression fractures  Preparation for the injection:  Do not eat any solid food or dairy products within 8 hours of your appointment.  You may drink clear liquids up to 3 hours before appointment.  Clear liquids include water, black coffee, juice or soda.  No milk or cream please. You may take your regular medication, including pain medications, with a sip of water before your appointment  Diabetics should hold regular insulin (if taken separately) and take 1/2 normal NPH dos the morning of the procedure.  Carry some sugar containing items with you to your appointment. A driver must accompany you and be prepared to drive you home after your procedure.  Bring all your current medications with your. An IV may be inserted and  sedation may be given at the discretion of the physician.   A blood pressure cuff, EKG and other monitors will often be applied during the procedure.  Some patients may need to have extra oxygen administered for a short period. You will be asked to provide medical information,  including your allergies, prior to the procedure.  We must know immediately if you are taking blood thinners (like Coumadin/Warfarin)  Or if you are allergic to IV iodine contrast (dye). We must know if you could possible be pregnant.  Possible side-effects: Bleeding from needle site Infection (rare, may require surgery) Nerve injury (rare) Numbness & tingling (temporary) Difficulty urinating (rare, temporary) Spinal headache ( a headache worse with upright posture) Light -headedness (temporary) Pain at injection site (several days) Decreased blood pressure (temporary) Weakness in arm/leg (temporary) Pressure sensation in back/neck (temporary)  Call if you experience: Fever/chills associated with headache or increased back/neck pain. Headache worsened by an upright position. New onset weakness or numbness of an extremity below the injection site Hives or difficulty breathing (go to the emergency room) Inflammation or drainage at the infection site Severe back/neck pain Any new symptoms which are concerning to you  Please note:  Although the local anesthetic injected can often make your back or neck feel good for several hours after the injection, the pain will likely return.  It takes 3-7 days for steroids to work in the epidural space.  You may not notice any pain relief for at least that one week.  If effective, we will often do a series of three injections spaced 3-6 weeks apart to maximally decrease your pain.  After the initial series, we generally will wait several months before considering a repeat injection of the same type.  If you have any questions, please call [phone] [facility]

## 2023-01-12 NOTE — Progress Notes (Signed)
Nursing Pain Medication Assessment:  Safety precautions to be maintained throughout the outpatient stay will include: orient to surroundings, keep bed in low position, maintain call bell within reach at all times, provide assistance with transfer out of bed and ambulation.  Medication Inspection Compliance: Pill count conducted under aseptic conditions, in front of the patient. Neither the pills nor the bottle was removed from the patient's sight at any time. Once count was completed pills were immediately returned to the patient in their original bottle.  Medication: Hydrocodone/APAP Pill/Patch Count:  38 of 90 pills remain Pill/Patch Appearance: Markings consistent with prescribed medication Bottle Appearance: Standard pharmacy container. Clearly labeled. Filled Date: 02 / 16 / 2024 Last Medication intake:  Today

## 2023-01-12 NOTE — Progress Notes (Signed)
PROVIDER NOTE: Information contained herein reflects review and annotations entered in association with encounter. Interpretation of such information and data should be left to medically-trained personnel. Information provided to patient can be located elsewhere in the medical record under "Patient Instructions". Document created using STT-dictation technology, any transcriptional errors that may result from process are unintentional.    Patient: Candace Juarez  Service Category: E/M  Provider: Gillis Santa, MD  DOB: [birth year]  DOS: 01/12/2023  Referring Provider: Gladstone Lighter, MD  MRN: [id number]  Specialty: Interventional Pain Management  PCP: Candace Lighter, MD  Type: Established Patient  Setting: Ambulatory outpatient    Location: Office  Delivery: Face-to-face     HPI  [name], a 72 y.o. year old female, is here today because of her Cervical radicular pain [M54.12]. [name] primary complain today is Hip Pain (Right ) and Neck Pain (left) Last encounter: My last encounter with her was on 12/02/2022. Pertinent problems: [name] has Polyarthralgia; History of left hip replacement; H/O right knee surgery; Chronic pain of both knees; Chronic right hip pain; Chronic pain syndrome; and Pain management contract signed on their pertinent problem list. Pain Assessment: Severity of Chronic pain is reported as a 6 /10. Location: Hip (neck left side) Right/hip pain down to the knee occassionally and around to the groin. Onset: More than a month ago. Quality: Discomfort, Aching, Radiating, Stabbing, Shooting. Timing: Constant. Modifying factor(s): meications, heat, procedure rest.. Vitals:  height is '5\' 2"'$  (1.575 m) and weight is 235 lb (106.6 kg). Her temporal temperature is 98.2 F (36.8 C). Her blood pressure is 134/62 and her pulse is 68. Her respiration is 16 and oxygen saturation is 100%.  BMI: Estimated body mass index is 42.98 kg/m as calculated from the following:   Height as of  this encounter: '5\' 2"'$  (1.575 m).   Weight as of this encounter: 235 lb (106.6 kg).  Reason for encounter: medication management.   -Has an appointment with [facility] for right hip pain. She did have a right hip joint injection with some response -Is having left cervical spine pain with right lateral rotation and cervical flexion.  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       Pharmacotherapy: No side-effects or adverse reactions reported. Compliance: No problems identified. Effectiveness: Clinically acceptable.  Candace Billow, RN  01/12/2023  1:50 PM  Sign when Signing Visit Nursing Pain Medication Assessment:  Safety precautions to be maintained throughout the outpatient stay will include: orient to surroundings, keep bed in low position, maintain call bell within reach at all times, provide assistance with transfer out of bed and ambulation.  Medication Inspection Compliance: Pill count conducted under aseptic conditions, in front of the patient. Neither the pills nor the bottle was removed from the patient's sight at any time. Once count was completed pills were immediately returned to the patient in their original bottle.  Medication: Hydrocodone/APAP Pill/Patch Count:  38 of 90 pills remain Pill/Patch Appearance: Markings consistent with prescribed medication Bottle Appearance: Standard pharmacy container. Clearly labeled. Filled Date: 02 / 16 / 2024 Last Medication intake:  Today    No results found for: "CBDTHCR" No results found for: "D8THCCBX" No results found for: "D9THCCBX"  UDS:  Summary  Date Value Ref Range Status  09/22/2022 Note  Final    Comment:     ==================================================================== Compliance Drug Analysis, Ur ==================================================================== Test                             Result       Flag       Units  Drug Present and Declared for Prescription Verification   Alprazolam                     148          EXPECTED   ng/mg creat   Alpha-hydroxyalprazolam        130          EXPECTED   ng/mg creat    Source of alprazolam is a scheduled prescription medication. Alpha-    hydroxyalprazolam is an expected metabolite of alprazolam.    Hydrocodone                    3065         EXPECTED   ng/mg creat   Hydromorphone                  93           EXPECTED   ng/mg creat   Dihydrocodeine                 430          EXPECTED   ng/mg creat   Norhydrocodone                 3508         EXPECTED   ng/mg creat    Sources of hydrocodone include scheduled prescription medications.    Hydromorphone, dihydrocodeine and norhydrocodone are expected    metabolites of hydrocodone. Hydromorphone and dihydrocodeine are    also available as scheduled prescription medications.    Bupropion                      PRESENT      EXPECTED   Hydroxybupropion               PRESENT      EXPECTED    Hydroxybupropion is an expected metabolite of bupropion.    Venlafaxine                    PRESENT      EXPECTED   Desmethylvenlafaxine           PRESENT      EXPECTED    Desmethylvenlafaxine is an expected metabolite of venlafaxine.    Acetaminophen                  PRESENT      EXPECTED   Metoprolol                     PRESENT      EXPECTED  Drug Present not Declared for Prescription Verification   Ephedrine/Pseudoephedrine      PRESENT      UNEXPECTED   Phenylpropanolamine            PRESENT      UNEXPECTED    Source of ephedrine/pseudoephedrine is most commonly pseudoephedrine    in over-the-counter or prescription cold and allergy medications.    Phenylpropanolamine is an expected  metabolite of  ephedrine/pseudoephedrine.  ==================================================================== Test                      Result    Flag   Units      Ref Range   Creatinine              60               mg/dL      >=20 ==================================================================== Declared Medications:  The flagging and interpretation on this report are based on the  following declared medications.  Unexpected results may arise from  inaccuracies in the declared medications.   **Note: The testing scope of this panel includes these medications:   Alprazolam (Xanax)  Bupropion (Wellbutrin)  Hydrocodone (Norco)  Metoprolol  Venlafaxine (Effexor)   **Note: The testing scope of this panel does not include small to  moderate amounts of these reported medications:   Acetaminophen (Norco)   **Note: The testing scope of this panel does not include the  following reported medications:   Abatacept  Aluminum Hydroxide (Maalox)  Atropine (Lomotil)  Cetirizine (Zyrtec)  Cyclosporine (Restasis)  Denosumab (Prolia)  Diphenoxylate (Lomotil)  Estradiol (Estrace)  Fluticasone (Flonase)  Hydroxychloroquine (Plaquenil)  Levothyroxine (Synthroid)  Magnesium Hydroxide (Maalox)  Nystatin (Mycostatin)  Polyethylene Glycol  Prednisone  Torsemide (Demadex) ==================================================================== For clinical consultation, please call [phone]. ====================================================================       ROS  Constitutional: Denies any fever or chills Gastrointestinal: No reported hemesis, hematochezia, vomiting, or acute GI distress Musculoskeletal:  Right hip pain, cervical spine pain with radiation into her left shoulder Neurological: No reported episodes of acute onset apraxia, aphasia, dysarthria, agnosia, amnesia, paralysis, loss of coordination, or loss of consciousness  Medication Review  ALPRAZolam,  Abatacept, HYDROcodone-acetaminophen, Metoprolol Succinate, Polyethyl Glycol-Propyl Glycol, buPROPion, cetirizine, cycloSPORINE, denosumab, diphenoxylate-atropine, estradiol, fluticasone, hydroxychloroquine, levothyroxine, losartan, nystatin, torsemide, and venlafaxine XR  History Review  Allergy: [name] is allergic to rifampin, sulfa antibiotics, cephalexin, nsaids, and sulfur. Drug: [name]  reports no history of drug use. Alcohol:  reports no history of alcohol use. Tobacco:  reports that she has never smoked. She has never used smokeless tobacco. Social: [name]  reports that she has never smoked. She has never used smokeless tobacco. She reports that she does not drink alcohol and does not use drugs. Medical:  has a past medical history of Arthritis, CHF (congestive heart failure) ([place]), History of hiatal hernia, Hypothyroidism, Lupus ([place]), Rheumatoid arthritis ([place]), and Rosacea. Surgical: [name]  has a past surgical history that includes Joint replacement (Left); Cholesteatoma excision (Right); vericose; Colonoscopy; Breast enhancement surgery; Cholecystectomy; Hernia repair; Tubal ligation; Total laparoscopic hysterectomy with bilateral salpingo oophorectomy (Bilateral, 10/24/2020); and Cystocele repair (N/A, 10/24/2020). Family: family history is not on file.  Laboratory Chemistry Profile   Renal Lab Results  Component Value Date   BUN 16 10/25/2020   CREATININE 0.87 10/25/2020   GFRNONAA >60 10/25/2020    Hepatic Lab Results  Component Value Date   AST 31 12/17/2020   ALT 22 12/17/2020   ALBUMIN 4.2 12/17/2020   ALKPHOS 71 12/17/2020   LIPASE 28 09/06/2020    Electrolytes Lab Results  Component Value Date   NA 136 10/25/2020   K 4.2 10/25/2020   CL 100 10/25/2020   CALCIUM 8.3 (L) 10/25/2020    Bone No results found for: "VD25OH", "VD125OH2TOT", "[id number]", "UK:060616", "25OHVITD1", "25OHVITD2", "25OHVITD3", "TESTOFREE", "TESTOSTERONE"  Inflammation (CRP: Acute  Phase) (ESR: Chronic Phase) No results found for: "CRP", "ESRSEDRATE", "  LATICACIDVEN"       Note: Above Lab results reviewed.  Recent Imaging Review  CLINICAL DATA:  Neck pain, chronic Neck pain, chronic, degenerative changes on xray Cervical radiculopathy, no red flags left neck and arm pain and left arm weakness   EXAM: MRI CERVICAL SPINE WITHOUT CONTRAST   TECHNIQUE: Multiplanar, multisequence MR imaging of the cervical spine was performed. No intravenous contrast was administered.   COMPARISON:  None Available.   FINDINGS: Alignment: Physiologic.   Vertebrae: No evidence of acute fracture, discitis, or aggressive osseous lesion.   Cord: Normal signal and morphology.   Posterior Fossa, vertebral arteries, paraspinal tissues: Negative.   Disc levels: The craniocervical junction is unremarkable.   C2-C3: No significant spinal canal or neural foraminal narrowing.   C3-C4: There is no significant disc bulge. Uncovertebral joint hypertrophy and bilateral facet arthropathy. There is no significant spinal canal stenosis. There is moderate bilateral neural foraminal narrowing.   C4-C5: Minimal disc bulging. Uncovertebral joint hypertrophy and bilateral facet arthropathy. There is moderate left and mild right neural foraminal stenosis.Minimal spinal canal narrowing.   C5-C6: Minimal disc bulging and uncovertebral joint hypertrophy with bilateral facet arthropathy. Minimal spinal canal narrowing. There is moderate left and mild right neural foraminal narrowing.   C6-C7: Minimal disc bulging. Uncovertebral joint hypertrophy and bilateral facet arthropathy. No significant spinal canal stenosis. There is moderate left and mild-to-moderate right neural foraminal stenosis.   C7-T1: No significant spinal canal or neural foraminal narrowing.   IMPRESSION: Multilevel degenerative changes of the cervical spine, worst from C3-C7:   C3-C4: Moderate bilateral neural foraminal  stenosis. No spinal canal stenosis.   C4-C5: Moderate left and mild right neural foraminal stenosis. Minimal spinal canal narrowing.   C5-C6: Moderate left and mild right neural foraminal stenosis. Minimal spinal canal narrowing.   C6-C7: Moderate left and mild-to-moderate right neural foraminal stenosis. No spinal canal stenosis.     Electronically Signed   By: Maurine Simmering M.D.   On: 10/08/2022 12:53  Physical Exam  General appearance: Well nourished, well developed, and well hydrated. In no apparent acute distress Mental status: Alert, oriented x 3 (person, place, & time)       Respiratory: No evidence of acute respiratory distress Eyes: PERLA Vitals: BP 134/62 (BP Location: Right Arm, Patient Position: Sitting, Cuff Size: Normal)   Pulse 68   Temp 98.2 F (36.8 C) (Temporal)   Resp 16   Ht '5\' 2"'$  (1.575 m)   Wt 235 lb (106.6 kg)   SpO2 100%   BMI 42.98 kg/m  BMI: Estimated body mass index is 42.98 kg/m as calculated from the following:   Height as of this encounter: '5\' 2"'$  (1.575 m).   Weight as of this encounter: 235 lb (106.6 kg). Ideal: Ideal body weight: 50.1 kg (110 lb 7.2 oz) Adjusted ideal body weight: 72.7 kg (160 lb 4.3 oz)  Cervical Spine Area Exam  Skin & Axial Inspection: No masses, redness, edema, swelling, or associated skin lesions Alignment: Symmetrical Functional ROM: Pain restricted ROM, to the left Stability: No instability detected Muscle Tone/Strength: Functionally intact. No obvious neuro-muscular anomalies detected. Sensory (Neurological): Dermatomal pain pattern Palpation: No palpable anomalies             Upper Extremity (UE) Exam    Side: Right upper extremity  Side: Left upper extremity  Skin & Extremity Inspection: Skin color, temperature, and hair growth are WNL. No peripheral edema or cyanosis. No masses, redness, swelling, asymmetry, or associated skin lesions. No  contractures.  Skin & Extremity Inspection: Skin color, temperature, and  hair growth are WNL. No peripheral edema or cyanosis. No masses, redness, swelling, asymmetry, or associated skin lesions. No contractures.  Functional ROM: Unrestricted ROM          Functional ROM: Unrestricted ROM          Muscle Tone/Strength: Functionally intact. No obvious neuro-muscular anomalies detected.  Muscle Tone/Strength: Functionally intact. No obvious neuro-muscular anomalies detected.  Sensory (Neurological): Unimpaired          Sensory (Neurological): Dermatomal pain pattern          Palpation: No palpable anomalies              Palpation: No palpable anomalies              Provocative Test(s):  Phalen's test: deferred Tinel's test: deferred Apley's scratch test (touch opposite shoulder):  Action 1 (Across chest): deferred Action 2 (Overhead): deferred Action 3 (LB reach): deferred   Provocative Test(s):  Phalen's test: deferred Tinel's test: deferred Apley's scratch test (touch opposite shoulder):  Action 1 (Across chest): deferred Action 2 (Overhead): deferred Action 3 (LB reach): deferred     Assessment   Diagnosis Status  1. Cervical radicular pain   2. Cervico-occipital neuralgia   3. Cervicalgia   4. Chronic right hip pain   5. Primary osteoarthritis of right hip   6. Chronic pain syndrome   7. Pain management contract signed   8. Rheumatoid arthritis involving multiple sites, unspecified whether rheumatoid factor present ([place])     Having a Flare-up Persistent Having a Flare-up   Plan of Care  Given increased left cervical radicular pain related to cervical neuroforaminal stenosis at C3-C4, C5-C6 and C6-C7, discussed cervical epidural steroid injection.  Patient states that she tries to perform home stretching exercises with limited response. Refill of her hydrocodone as below, no change in dose.  UDS up-to-date and appropriate.  1. Cervical radicular pain - Cervical Epidural Injection; Future  2. Cervico-occipital neuralgia  3. Cervicalgia  4.  Chronic right hip pain  5. Primary osteoarthritis of right hip  6. Chronic pain syndrome - Cervical Epidural Injection; Future - HYDROcodone-acetaminophen (NORCO) 7.5-325 MG tablet; Take 1 tablet by mouth 3 (three) times daily as needed for severe pain. Must last 30 days  Dispense: 90 tablet; Refill: 0 - HYDROcodone-acetaminophen (NORCO) 7.5-325 MG tablet; Take 1 tablet by mouth 3 (three) times daily as needed for severe pain. Must last 30 days  Dispense: 90 tablet; Refill: 0 - HYDROcodone-acetaminophen (NORCO) 7.5-325 MG tablet; Take 1 tablet by mouth 3 (three) times daily as needed for severe pain. Must last 30 days  Dispense: 90 tablet; Refill: 0  7. Pain management contract signed - HYDROcodone-acetaminophen (NORCO) 7.5-325 MG tablet; Take 1 tablet by mouth 3 (three) times daily as needed for severe pain. Must last 30 days  Dispense: 90 tablet; Refill: 0 - HYDROcodone-acetaminophen (NORCO) 7.5-325 MG tablet; Take 1 tablet by mouth 3 (three) times daily as needed for severe pain. Must last 30 days  Dispense: 90 tablet; Refill: 0 - HYDROcodone-acetaminophen (NORCO) 7.5-325 MG tablet; Take 1 tablet by mouth 3 (three) times daily as needed for severe pain. Must last 30 days  Dispense: 90 tablet; Refill: 0  8. Rheumatoid arthritis involving multiple sites, unspecified whether rheumatoid factor present ([place]) - HYDROcodone-acetaminophen (NORCO) 7.5-325 MG tablet; Take 1 tablet by mouth 3 (three) times daily as needed for severe pain. Must last 30 days  Dispense: 90 tablet;  Refill: 0 - HYDROcodone-acetaminophen (NORCO) 7.5-325 MG tablet; Take 1 tablet by mouth 3 (three) times daily as needed for severe pain. Must last 30 days  Dispense: 90 tablet; Refill: 0 - HYDROcodone-acetaminophen (NORCO) 7.5-325 MG tablet; Take 1 tablet by mouth 3 (three) times daily as needed for severe pain. Must last 30 days  Dispense: 90 tablet; Refill: 0     Follow-up plan:   Return in about 1 week (around 01/19/2023)  for left C-ESI, in clinic (PO Valium).    Recent Visits Date Type Provider Dept  12/16/22 Office Visit Candace Santa, MD [facility]  12/02/22 Procedure visit Candace Santa, MD [facility]  11/11/22 Office Visit Candace Santa, MD [facility]  Showing recent visits within past 90 days and meeting all other requirements Today's Visits Date Type Provider Dept  01/12/23 Office Visit Candace Santa, MD [facility]  Showing today's visits and meeting all other requirements Future Appointments No visits were found meeting these conditions. Showing future appointments within next 90 days and meeting all other requirements  I discussed the assessment and treatment plan with the patient. The patient was provided an opportunity to ask questions and all were answered. The patient agreed with the plan and demonstrated an understanding of the instructions.  Patient advised to call back or seek an in-person evaluation if the symptoms or condition worsens.  Duration of encounter: 85mnutes.  Total time on encounter, as per AMA guidelines included both the face-to-face and non-face-to-face time personally spent by the physician and/or other qualified health care professional(s) on the day of the encounter (includes time in activities that require the physician or other qualified health care professional and does not include time in activities normally performed by clinical staff). Physician's time may include the following activities when performed: Preparing to see the patient (e.g., pre-charting review of records, searching for previously ordered imaging, lab work, and nerve conduction tests) Review of prior analgesic pharmacotherapies. Reviewing PMP Interpreting ordered tests (e.g., lab work, imaging, nerve conduction tests) Performing post-procedure evaluations, including interpretation of diagnostic procedures Obtaining and/or reviewing separately obtained  history Performing a medically appropriate examination and/or evaluation Counseling and educating the patient/family/caregiver Ordering medications, tests, or procedures Referring and communicating with other health care professionals (when not separately reported) Documenting clinical information in the electronic or other health record Independently interpreting results (not separately reported) and communicating results to the patient/ family/caregiver Care coordination (not separately reported)  Note by: BGillis Santa MD Date: 01/12/2023; Time: 2:26 PM

## 2023-02-03 ENCOUNTER — Ambulatory Visit: Payer: Medicare Other | Admitting: Student in an Organized Health Care Education/Training Program

## 2023-04-19 DIAGNOSIS — I10 Essential (primary) hypertension: Secondary | ICD-10-CM | POA: Insufficient documentation

## 2023-04-22 ENCOUNTER — Encounter: Payer: Self-pay | Admitting: Student in an Organized Health Care Education/Training Program

## 2023-04-22 ENCOUNTER — Ambulatory Visit
Payer: Medicare Other | Attending: Student in an Organized Health Care Education/Training Program | Admitting: Student in an Organized Health Care Education/Training Program

## 2023-04-22 VITALS — BP 142/48 | HR 74 | Temp 96.4°F | Resp 16 | Ht 62.0 in | Wt 236.0 lb

## 2023-04-22 DIAGNOSIS — M069 Rheumatoid arthritis, unspecified: Secondary | ICD-10-CM | POA: Insufficient documentation

## 2023-04-22 DIAGNOSIS — Z96642 Presence of left artificial hip joint: Secondary | ICD-10-CM | POA: Diagnosis present

## 2023-04-22 DIAGNOSIS — M25511 Pain in right shoulder: Secondary | ICD-10-CM | POA: Insufficient documentation

## 2023-04-22 DIAGNOSIS — M19012 Primary osteoarthritis, left shoulder: Secondary | ICD-10-CM | POA: Diagnosis present

## 2023-04-22 DIAGNOSIS — G8929 Other chronic pain: Secondary | ICD-10-CM | POA: Diagnosis present

## 2023-04-22 DIAGNOSIS — G894 Chronic pain syndrome: Secondary | ICD-10-CM | POA: Diagnosis present

## 2023-04-22 DIAGNOSIS — M1611 Unilateral primary osteoarthritis, right hip: Secondary | ICD-10-CM | POA: Diagnosis present

## 2023-04-22 DIAGNOSIS — M19011 Primary osteoarthritis, right shoulder: Secondary | ICD-10-CM | POA: Insufficient documentation

## 2023-04-22 DIAGNOSIS — M25551 Pain in right hip: Secondary | ICD-10-CM | POA: Insufficient documentation

## 2023-04-22 DIAGNOSIS — Z0289 Encounter for other administrative examinations: Secondary | ICD-10-CM | POA: Insufficient documentation

## 2023-04-22 DIAGNOSIS — M25512 Pain in left shoulder: Secondary | ICD-10-CM | POA: Insufficient documentation

## 2023-04-22 MED ORDER — HYDROCODONE-ACETAMINOPHEN 7.5-325 MG PO TABS: 1.0000 | ORAL_TABLET | Freq: Three times a day (TID) | ORAL | 0 refills | Status: AC | PRN

## 2023-04-22 MED ORDER — HYDROCODONE-ACETAMINOPHEN 7.5-325 MG PO TABS: 1.0000 | ORAL_TABLET | Freq: Three times a day (TID) | ORAL | 0 refills | Status: DC | PRN

## 2023-04-22 NOTE — Progress Notes (Signed)
PROVIDER NOTE: Information contained herein reflects review and annotations entered in association with encounter. Interpretation of such information and data should be left to medically-trained personnel. Information provided to patient can be located elsewhere in the medical record under "Patient Instructions". Document created using STT-dictation technology, any transcriptional errors that may result from process are unintentional.    Patient: Candace Juarez  Service Category: E/M  Provider: Edward Jolly, MD  DOB: November 19, 1950  DOS: 04/22/2023  Referring Provider: Enid Baas, MD  MRN: [id number]  Specialty: Interventional Pain Management  PCP: Enid Baas, MD  Type: Established Patient  Setting: Ambulatory outpatient    Location: Office  Delivery: Face-to-face     HPI  Candace Juarez, a 72 y.o. year old female, is here today because of her Chronic right hip pain [M25.551, G89.29]. Candace Juarez primary complain today is Hip Pain (left) Last encounter: My last encounter with her was on 12/02/2022. Pertinent problems: Ms. Girvan has Polyarthralgia; History of left hip replacement; H/O right knee surgery; Chronic pain of both knees; Chronic right hip pain; Chronic pain syndrome; and Pain management contract signed on their pertinent problem list. Pain Assessment: Severity of Chronic pain is reported as a 6 /10. Location: Hip Right/down side of leg and the front of leg, effects right foot and all toes. Onset: More than a month ago. Quality: Aching, Constant, Discomfort, Numbness, Tingling, Crushing. Timing: Constant. Modifying factor(s): TENS, meds, heat. Vitals:  height is 5\' 2"  (1.575 m) and weight is 236 lb (107 kg). Her temperature is 96.4 F (35.8 C) (abnormal). Her blood pressure is 142/48 (abnormal) and her pulse is 74. Her respiration is 16 and oxygen saturation is 99%.  BMI: Estimated body mass index is 43.16 kg/m as calculated from the following:   Height as of this encounter: 5\' 2"   (1.575 m).   Weight as of this encounter: 236 lb (107 kg).  Reason for encounter: medication management.   -Has upcoming right hip replacement with Dr. Joice Lofts on July 2. -She states that her shoulders are doing much better after shoulder injections -She was provided a surgeons letter regarding perioperative opioid prescribing which is to be managed by the surgical team -Refill of hydrocodone as below for chronic pain management.  No change in dose.  UDS up-to-date and appropriate.     Pharmacotherapy Assessment  Analgesic: Norco 7.5 mg 3 times daily as needed, quantity 90/month; MME equals 22.5     Monitoring: [place] PMP: PDMP reviewed during this encounter.       Pharmacotherapy: No side-effects or adverse reactions reported. Compliance: No problems identified. Effectiveness: Clinically acceptable.  Newman Pies, RN  04/22/2023  2:14 PM  Sign when Signing Visit Nursing Pain Medication Assessment:  Safety precautions to be maintained throughout the outpatient stay will include: orient to surroundings, keep bed in low position, maintain call bell within reach at all times, provide assistance with transfer out of bed and ambulation.  Medication Inspection Compliance: Pill count conducted under aseptic conditions, in front of the patient. Neither the pills nor the bottle was removed from the patient's sight at any time. Once count was completed pills were immediately returned to the patient in their original bottle.  Medication: Hydrocodone/APAP Pill/Patch Count:  16 of 90 pills remain Pill/Patch Appearance: Markings consistent with prescribed medication Bottle Appearance: Standard pharmacy container. Clearly labeled. Filled Date: 5 / 73 / 2024 Last Medication intake:  Today    No results found for: "CBDTHCR" No results found for: "D8THCCBX" No results found  for: "D9THCCBX"  UDS:  Summary  Date Value Ref Range Status  09/22/2022 Note  Final    Comment:     ==================================================================== Compliance Drug Analysis, Ur ==================================================================== Test                             Result       Flag       Units  Drug Present and Declared for Prescription Verification   Alprazolam                     148          EXPECTED   ng/mg creat   Alpha-hydroxyalprazolam        130          EXPECTED   ng/mg creat    Source of alprazolam is a scheduled prescription medication. Alpha-    hydroxyalprazolam is an expected metabolite of alprazolam.    Hydrocodone                    3065         EXPECTED   ng/mg creat   Hydromorphone                  93           EXPECTED   ng/mg creat   Dihydrocodeine                 430          EXPECTED   ng/mg creat   Norhydrocodone                 3508         EXPECTED   ng/mg creat    Sources of hydrocodone include scheduled prescription medications.    Hydromorphone, dihydrocodeine and norhydrocodone are expected    metabolites of hydrocodone. Hydromorphone and dihydrocodeine are    also available as scheduled prescription medications.    Bupropion                      PRESENT      EXPECTED   Hydroxybupropion               PRESENT      EXPECTED    Hydroxybupropion is an expected metabolite of bupropion.    Venlafaxine                    PRESENT      EXPECTED   Desmethylvenlafaxine           PRESENT      EXPECTED    Desmethylvenlafaxine is an expected metabolite of venlafaxine.    Acetaminophen                  PRESENT      EXPECTED   Metoprolol                     PRESENT      EXPECTED  Drug Present not Declared for Prescription Verification   Ephedrine/Pseudoephedrine      PRESENT      UNEXPECTED   Phenylpropanolamine            PRESENT      UNEXPECTED    Source of ephedrine/pseudoephedrine is most commonly pseudoephedrine    in over-the-counter or prescription cold and allergy medications.    Phenylpropanolamine is  an expected  metabolite of    ephedrine/pseudoephedrine.  ==================================================================== Test                      Result    Flag   Units      Ref Range   Creatinine              60               mg/dL      >=82 ==================================================================== Declared Medications:  The flagging and interpretation on this report are based on the  following declared medications.  Unexpected results may arise from  inaccuracies in the declared medications.   **Note: The testing scope of this panel includes these medications:   Alprazolam (Xanax)  Bupropion (Wellbutrin)  Hydrocodone (Norco)  Metoprolol  Venlafaxine (Effexor)   **Note: The testing scope of this panel does not include small to  moderate amounts of these reported medications:   Acetaminophen (Norco)   **Note: The testing scope of this panel does not include the  following reported medications:   Abatacept  Aluminum Hydroxide (Maalox)  Atropine (Lomotil)  Cetirizine (Zyrtec)  Cyclosporine (Restasis)  Denosumab (Prolia)  Diphenoxylate (Lomotil)  Estradiol (Estrace)  Fluticasone (Flonase)  Hydroxychloroquine (Plaquenil)  Levothyroxine (Synthroid)  Magnesium Hydroxide (Maalox)  Nystatin (Mycostatin)  Polyethylene Glycol  Prednisone  Torsemide (Demadex) ==================================================================== For clinical consultation, please call [phone]. ====================================================================       ROS  Constitutional: Denies any fever or chills Gastrointestinal: No reported hemesis, hematochezia, vomiting, or acute GI distress Musculoskeletal:  Right hip pain Neurological: No reported episodes of acute onset apraxia, aphasia, dysarthria, agnosia, amnesia, paralysis, loss of coordination, or loss of consciousness  Medication Review  ALPRAZolam, Abatacept, HYDROcodone-acetaminophen, Metoprolol Succinate,  Polyethyl Glycol-Propyl Glycol, buPROPion, cetirizine, cholecalciferol, cycloSPORINE, denosumab, diphenoxylate-atropine, estradiol, fluticasone, hydroxychloroquine, levothyroxine, losartan, nystatin, torsemide, and venlafaxine XR  History Review  Allergy: Ms. Wehrer is allergic to rifampin, sulfa antibiotics, cephalexin, nsaids, and sulfur. Drug: Ms. Nemet  reports no history of drug use. Alcohol:  reports no history of alcohol use. Tobacco:  reports that she has never smoked. She has never used smokeless tobacco. Social: Ms. Balaguer  reports that she has never smoked. She has never used smokeless tobacco. She reports that she does not drink alcohol and does not use drugs. Medical:  has a past medical history of Arthritis, CHF (congestive heart failure) (HCC), History of hiatal hernia, Hypothyroidism, Lupus (HCC), Rheumatoid arthritis (HCC), and Rosacea. Surgical: Ms. Varone  has a past surgical history that includes Joint replacement (Left); Cholesteatoma excision (Right); vericose; Colonoscopy; Breast enhancement surgery; Cholecystectomy; Hernia repair; Tubal ligation; Total laparoscopic hysterectomy with bilateral salpingo oophorectomy (Bilateral, 10/24/2020); and Cystocele repair (N/A, 10/24/2020). Family: family history is not on file.  Laboratory Chemistry Profile   Renal Lab Results  Component Value Date   BUN 16 10/25/2020   CREATININE 0.87 10/25/2020   GFRNONAA >60 10/25/2020    Hepatic Lab Results  Component Value Date   AST 31 12/17/2020   ALT 22 12/17/2020   ALBUMIN 4.2 12/17/2020   ALKPHOS 71 12/17/2020   LIPASE 28 09/06/2020    Electrolytes Lab Results  Component Value Date   NA 136 10/25/2020   K 4.2 10/25/2020   CL 100 10/25/2020   CALCIUM 8.3 (L) 10/25/2020    Bone No results found for: "VD25OH", "VD125OH2TOT", "HQ4696EX5", "MW4132GM0", "25OHVITD1", "25OHVITD2", "25OHVITD3", "TESTOFREE", "TESTOSTERONE"  Inflammation (CRP: Acute Phase) (ESR: Chronic Phase) No results  found for: "CRP", "ESRSEDRATE", "  LATICACIDVEN"       Note: Above Lab results reviewed.  Recent Imaging Review  CLINICAL DATA:  Neck pain, chronic Neck pain, chronic, degenerative changes on xray Cervical radiculopathy, no red flags left neck and arm pain and left arm weakness   EXAM: MRI CERVICAL SPINE WITHOUT CONTRAST   TECHNIQUE: Multiplanar, multisequence MR imaging of the cervical spine was performed. No intravenous contrast was administered.   COMPARISON:  None Available.   FINDINGS: Alignment: Physiologic.   Vertebrae: No evidence of acute fracture, discitis, or aggressive osseous lesion.   Cord: Normal signal and morphology.   Posterior Fossa, vertebral arteries, paraspinal tissues: Negative.   Disc levels: The craniocervical junction is unremarkable.   C2-C3: No significant spinal canal or neural foraminal narrowing.   C3-C4: There is no significant disc bulge. Uncovertebral joint hypertrophy and bilateral facet arthropathy. There is no significant spinal canal stenosis. There is moderate bilateral neural foraminal narrowing.   C4-C5: Minimal disc bulging. Uncovertebral joint hypertrophy and bilateral facet arthropathy. There is moderate left and mild right neural foraminal stenosis.Minimal spinal canal narrowing.   C5-C6: Minimal disc bulging and uncovertebral joint hypertrophy with bilateral facet arthropathy. Minimal spinal canal narrowing. There is moderate left and mild right neural foraminal narrowing.   C6-C7: Minimal disc bulging. Uncovertebral joint hypertrophy and bilateral facet arthropathy. No significant spinal canal stenosis. There is moderate left and mild-to-moderate right neural foraminal stenosis.   C7-T1: No significant spinal canal or neural foraminal narrowing.   IMPRESSION: Multilevel degenerative changes of the cervical spine, worst from C3-C7:   C3-C4: Moderate bilateral neural foraminal stenosis. No spinal canal stenosis.    C4-C5: Moderate left and mild right neural foraminal stenosis. Minimal spinal canal narrowing.   C5-C6: Moderate left and mild right neural foraminal stenosis. Minimal spinal canal narrowing.   C6-C7: Moderate left and mild-to-moderate right neural foraminal stenosis. No spinal canal stenosis.     Electronically Signed   By: Caprice Renshaw M.D.   On: 10/08/2022 12:53  Physical Exam  General appearance: Well nourished, well developed, and well hydrated. In no apparent acute distress Mental status: Alert, oriented x 3 (person, place, & time)       Respiratory: No evidence of acute respiratory distress Eyes: PERLA Vitals: BP (!) 142/48   Pulse 74   Temp (!) 96.4 F (35.8 C)   Resp 16   Ht 5\' 2"  (1.575 m)   Wt 236 lb (107 kg)   SpO2 99%   BMI 43.16 kg/m  BMI: Estimated body mass index is 43.16 kg/m as calculated from the following:   Height as of this encounter: 5\' 2"  (1.575 m).   Weight as of this encounter: 236 lb (107 kg). Ideal: Ideal body weight: 50.1 kg (110 lb 7.2 oz) Adjusted ideal body weight: 72.9 kg (160 lb 10.7 oz)  Cervical Spine Area Exam  Skin & Axial Inspection: No masses, redness, edema, swelling, or associated skin lesions Alignment: Symmetrical Functional ROM: Pain restricted ROM, to the left Stability: No instability detected Muscle Tone/Strength: Functionally intact. No obvious neuro-muscular anomalies detected. Sensory (Neurological): Dermatomal pain pattern Palpation: No palpable anomalies              Upper Extremity (UE) Exam    Side: Right upper extremity  Side: Left upper extremity  Skin & Extremity Inspection: Skin color, temperature, and hair growth are WNL. No peripheral edema or cyanosis. No masses, redness, swelling, asymmetry, or associated skin lesions. No contractures.  Skin & Extremity Inspection: Skin color,  temperature, and hair growth are WNL. No peripheral edema or cyanosis. No masses, redness, swelling, asymmetry, or associated skin  lesions. No contractures.  Functional ROM: Unrestricted ROM          Functional ROM: Unrestricted ROM          Muscle Tone/Strength: Functionally intact. No obvious neuro-muscular anomalies detected.  Muscle Tone/Strength: Functionally intact. No obvious neuro-muscular anomalies detected.  Sensory (Neurological): Unimpaired          Sensory (Neurological): Dermatomal pain pattern          Palpation: No palpable anomalies              Palpation: No palpable anomalies              Provocative Test(s):  Phalen's test: deferred Tinel's test: deferred Apley's scratch test (touch opposite shoulder):  Action 1 (Across chest): deferred Action 2 (Overhead): deferred Action 3 (LB reach): deferred   Provocative Test(s):  Phalen's test: deferred Tinel's test: deferred Apley's scratch test (touch opposite shoulder):  Action 1 (Across chest): deferred Action 2 (Overhead): deferred Action 3 (LB reach): deferred     Assessment   Diagnosis Status  1. Chronic right hip pain   2. Primary osteoarthritis of right hip   3. Rheumatoid arthritis involving multiple sites, unspecified whether rheumatoid factor present (HCC)   4. 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 Chronic pain of both shoulders   6. History of left hip replacement   7. Chronic pain syndrome   8. Pain management contract signed      Having a Flare-up Having a Flare-up Having a Flare-up   Plan of Care    1. Chronic right hip pain  2. Primary osteoarthritis of right hip  3. Rheumatoid arthritis involving multiple sites, unspecified whether rheumatoid factor present (HCC) - HYDROcodone-acetaminophen (NORCO) 7.5-325 MG tablet; Take 1 tablet by mouth 3 (three) times daily as needed for severe pain. Must last 30 days  Dispense: 90 tablet; Refill: 0 - HYDROcodone-acetaminophen (NORCO) 7.5-325 MG tablet; Take 1 tablet by mouth 3 (three) times daily as needed for severe pain. Must last 30 days  Dispense: 90 tablet;  Refill: 0 - HYDROcodone-acetaminophen (NORCO) 7.5-325 MG tablet; Take 1 tablet by mouth 3 (three) times daily as needed for severe pain. Must last 30 days  Dispense: 90 tablet; Refill: 0  4. 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 Chronic pain of both shoulders  6. History of left hip replacement  7. Chronic pain syndrome - HYDROcodone-acetaminophen (NORCO) 7.5-325 MG tablet; Take 1 tablet by mouth 3 (three) times daily as needed for severe pain. Must last 30 days  Dispense: 90 tablet; Refill: 0 - HYDROcodone-acetaminophen (NORCO) 7.5-325 MG tablet; Take 1 tablet by mouth 3 (three) times daily as needed for severe pain. Must last 30 days  Dispense: 90 tablet; Refill: 0 - HYDROcodone-acetaminophen (NORCO) 7.5-325 MG tablet; Take 1 tablet by mouth 3 (three) times daily as needed for severe pain. Must last 30 days  Dispense: 90 tablet; Refill: 0  8. Pain management contract signed - HYDROcodone-acetaminophen (NORCO) 7.5-325 MG tablet; Take 1 tablet by mouth 3 (three) times daily as needed for severe pain. Must last 30 days  Dispense: 90 tablet; Refill: 0 - HYDROcodone-acetaminophen (NORCO) 7.5-325 MG tablet; Take 1 tablet by mouth 3 (three) times daily as needed for severe pain. Must last 30 days  Dispense: 90 tablet; Refill: 0 - HYDROcodone-acetaminophen (NORCO) 7.5-325 MG tablet; Take 1 tablet by mouth 3 (  three) times daily as needed for severe pain. Must last 30 days  Dispense: 90 tablet; Refill: 0      Follow-up plan:   Return in about 3 months (around 07/23/2023) for Medication Management, in person.    Recent Visits No visits were found meeting these conditions. Showing recent visits within past 90 days and meeting all other requirements Today's Visits Date Type Provider Dept  04/22/23 Office Visit Edward Jolly, MD [facility]  Showing today's visits and meeting all other requirements Future Appointments Date Type Provider Dept  07/15/23 Appointment  Edward Jolly, MD [facility]  Showing future appointments within next 90 days and meeting all other requirements  I discussed the assessment and treatment plan with the patient. The patient was provided an opportunity to ask questions and all were answered. The patient agreed with the plan and demonstrated an understanding of the instructions.  Patient advised to call back or seek an in-person evaluation if the symptoms or condition worsens.  Duration of encounter: .  Total time on encounter, as per AMA guidelines included both the face-to-face and non-face-to-face time personally spent by the physician and/or other qualified health care professional(s) on the day of the encounter (includes time in activities that require the physician or other qualified health care professional and does not include time in activities normally performed by clinical staff). Physician's time may include the following activities when performed: Preparing to see the patient (e.g., pre-charting review of records, searching for previously ordered imaging, lab work, and nerve conduction tests) Review of prior analgesic pharmacotherapies. Reviewing PMP Interpreting ordered tests (e.g., lab work, imaging, nerve conduction tests) Performing post-procedure evaluations, including interpretation of diagnostic procedures Obtaining and/or reviewing separately obtained history Performing a medically appropriate examination and/or evaluation Counseling and educating the patient/family/caregiver Ordering medications, tests, or procedures Referring and communicating with other health care professionals (when not separately reported) Documenting clinical information in the electronic or other health record Independently interpreting results (not separately reported) and communicating results to the patient/ family/caregiver Care coordination (not separately reported)  Note by: Edward Jolly, MD Date:  04/22/2023; Time: 2:57 PM

## 2023-04-22 NOTE — Progress Notes (Signed)
Nursing Pain Medication Assessment:  Safety precautions to be maintained throughout the outpatient stay will include: orient to surroundings, keep bed in low position, maintain call bell within reach at all times, provide assistance with transfer out of bed and ambulation.  Medication Inspection Compliance: Pill count conducted under aseptic conditions, in front of the patient. Neither the pills nor the bottle was removed from the patient's sight at any time. Once count was completed pills were immediately returned to the patient in their original bottle.  Medication: Hydrocodone/APAP Pill/Patch Count:  16 of 90 pills remain Pill/Patch Appearance: Markings consistent with prescribed medication Bottle Appearance: Standard pharmacy container. Clearly labeled. Filled Date: 5 / 52 / 2024 Last Medication intake:  Today

## 2023-04-28 ENCOUNTER — Other Ambulatory Visit: Payer: Self-pay | Admitting: Surgery

## 2023-04-30 ENCOUNTER — Other Ambulatory Visit: Payer: Self-pay

## 2023-04-30 ENCOUNTER — Encounter
Admission: RE | Admit: 2023-04-30 | Discharge: 2023-04-30 | Disposition: A | Discharge status: Home / Self Care | Payer: Medicare Other | Source: Ambulatory Visit | Attending: Surgery | Admitting: Surgery

## 2023-04-30 VITALS — BP 138/66 | HR 69 | Resp 16 | Wt 233.7 lb

## 2023-04-30 DIAGNOSIS — Z01818 Encounter for other preprocedural examination: Secondary | ICD-10-CM | POA: Insufficient documentation

## 2023-04-30 DIAGNOSIS — Z01810 Encounter for preprocedural cardiovascular examination: Secondary | ICD-10-CM

## 2023-04-30 DIAGNOSIS — Z01812 Encounter for preprocedural laboratory examination: Secondary | ICD-10-CM

## 2023-04-30 HISTORY — DX: Restless legs syndrome: G25.81

## 2023-04-30 HISTORY — DX: Other complications of anesthesia, initial encounter: T88.59XA

## 2023-04-30 HISTORY — DX: Myoneural disorder, unspecified: G70.9

## 2023-04-30 HISTORY — DX: Anxiety disorder, unspecified: F41.9

## 2023-04-30 HISTORY — DX: Chronic kidney disease, unspecified: N18.9

## 2023-04-30 LAB — CBC WITH DIFFERENTIAL/PLATELET
Abs Immature Granulocytes: 0.02 10*3/uL (ref 0.00–0.07)
Basophils Absolute: 0 10*3/uL (ref 0.0–0.1)
Basophils Relative: 0 %
Eosinophils Absolute: 0.1 10*3/uL (ref 0.0–0.5)
Eosinophils Relative: 1 %
HCT: 37.5 % (ref 36.0–46.0)
Hemoglobin: 12 g/dL (ref 12.0–15.0)
Immature Granulocytes: 0 %
Lymphocytes Relative: 23 %
Lymphs Abs: 1.8 10*3/uL (ref 0.7–4.0)
MCH: 30 pg (ref 26.0–34.0)
MCHC: 32 g/dL (ref 30.0–36.0)
MCV: 93.8 fL (ref 80.0–100.0)
Monocytes Absolute: 0.5 10*3/uL (ref 0.1–1.0)
Monocytes Relative: 6 %
Neutro Abs: 5.8 10*3/uL (ref 1.7–7.7)
Neutrophils Relative %: 70 %
Platelets: 319 10*3/uL (ref 150–400)
RBC: 4 MIL/uL (ref 3.87–5.11)
RDW: 14.2 % (ref 11.5–15.5)
WBC: 8.2 10*3/uL (ref 4.0–10.5)
nRBC: 0 % (ref 0.0–0.2)

## 2023-04-30 LAB — SURGICAL PCR SCREEN
MRSA, PCR: NEGATIVE
Staphylococcus aureus: NEGATIVE

## 2023-04-30 LAB — URINALYSIS, ROUTINE W REFLEX MICROSCOPIC
Bilirubin Urine: NEGATIVE
Glucose, UA: NEGATIVE mg/dL
Hgb urine dipstick: NEGATIVE
Ketones, ur: NEGATIVE mg/dL
Leukocytes,Ua: NEGATIVE
Nitrite: NEGATIVE
Protein, ur: NEGATIVE mg/dL
Specific Gravity, Urine: 1.016 (ref 1.005–1.030)
pH: 5 (ref 5.0–8.0)

## 2023-04-30 LAB — COMPREHENSIVE METABOLIC PANEL
ALT: 20 U/L (ref 0–44)
AST: 21 U/L (ref 15–41)
Albumin: 3.7 g/dL (ref 3.5–5.0)
Alkaline Phosphatase: 66 U/L (ref 38–126)
Anion gap: 12 (ref 5–15)
BUN: 52 mg/dL — ABNORMAL HIGH (ref 8–23)
CO2: 28 mmol/L (ref 22–32)
Calcium: 9.2 mg/dL (ref 8.9–10.3)
Chloride: 93 mmol/L — ABNORMAL LOW (ref 98–111)
Creatinine, Ser: 1.89 mg/dL — ABNORMAL HIGH (ref 0.44–1.00)
GFR, Estimated: 28 mL/min — ABNORMAL LOW (ref >60)
Glucose, Bld: 93 mg/dL (ref 70–99)
Potassium: 4.9 mmol/L (ref 3.5–5.1)
Sodium: 133 mmol/L — ABNORMAL LOW (ref 135–145)
Total Bilirubin: 0.2 mg/dL — ABNORMAL LOW (ref 0.3–1.2)
Total Protein: 7.1 g/dL (ref 6.5–8.1)

## 2023-04-30 LAB — TYPE AND SCREEN
ABO/RH(D): A POS
Antibody Screen: NEGATIVE

## 2023-04-30 NOTE — Patient Instructions (Addendum)
Your procedure is scheduled on: 05/11/23 - Tuesday Report to the Registration Desk on the 1st floor of the [facility]. To find out your arrival time, please call [phone] between 1PM - 3PM on: 05/10/23 - Monday If your arrival time is 6:00 am, do not arrive before that time as the [facility] entrance doors do not open until 6:00 am.  REMEMBER: Instructions that are not followed completely may result in serious medical risk, up to and including death; or upon the discretion of your surgeon and anesthesiologist your surgery may need to be rescheduled.  Do not eat food after midnight the night before surgery.  No gum chewing or hard candies.  You may however, drink CLEAR liquids up to 2 hours before you are scheduled to arrive for your surgery. Do not drink anything within 2 hours of your scheduled arrival time.  Clear liquids include: - water  - apple juice without pulp - gatorade (not RED colors) - black coffee or tea (Do NOT add milk or creamers to the coffee or tea) Do NOT drink anything that is not on this list.  In addition, your doctor has ordered for you to drink the provided:  Ensure Pre-Surgery Clear Carbohydrate Drink  Drinking this carbohydrate drink up to two hours before surgery helps to reduce insulin resistance and improve patient outcomes. Please complete drinking 2 hours before scheduled arrival time.  One week prior to surgery:diclofenac Sodium (VOLTAREN)  Stop on 05/04/23, Anti-inflammatories (NSAIDS) such as Advil, Aleve, Ibuprofen, Motrin, Naproxen, Naprosyn and Aspirin based products such as Excedrin, Goody's Powder, BC Powder.  Stop ANY OVER THE COUNTER supplements until after surgery.  You may take Tylenol if needed for pain up until the day of surgery.   TAKE ONLY THESE MEDICATIONS THE MORNING OF SURGERY WITH A SIP OF WATER:  buPROPion (WELLBUTRIN XL)  cycloSPORINE (RESTASIS)  HYDROcodone-acetaminophen (NORCO) if needed levothyroxine (SYNTHROID)   venlafaxine XR (EFFEXOR-XR)  pantoprazole (PROTONIX) take 1 tablet the night before surgery and 1 tablet on the morning of. SYSTANE ULTRA OP  ALPRAZolam (XANAX) if needed.   No Alcohol for 24 hours before or after surgery.  No Smoking including e-cigarettes for 24 hours before surgery.  No chewable tobacco products for at least 6 hours before surgery.  No nicotine patches on the day of surgery.  Do not use any "recreational" drugs for at least a week (preferably 2 weeks) before your surgery.  Please be advised that the combination of cocaine and anesthesia may have negative outcomes, up to and including death. If you test positive for cocaine, your surgery will be cancelled.  On the morning of surgery brush your teeth with toothpaste and water, you may rinse your mouth with mouthwash if you wish. Do not swallow any toothpaste or mouthwash.  Use CHG Soap or wipes as directed on instruction sheet.  Do not wear jewelry, make-up, hairpins, clips or nail polish.  Do not wear lotions, powders, or perfumes.   Do not shave body hair from the neck down 48 hours before surgery.  Contact lenses, hearing aids and dentures may not be worn into surgery.  Do not bring valuables to the hospital. [facility] is not responsible for any missing/lost belongings or valuables.   Notify your doctor if there is any change in your medical condition (cold, fever, infection).  Wear comfortable clothing (specific to your surgery type) to the hospital.  After surgery, you can help prevent lung complications by doing breathing exercises.  Take deep  breaths and cough every 1-2 hours. Your doctor may order a device called an Incentive Spirometer to help you take deep breaths. When coughing or sneezing, hold a pillow firmly against your incision with both hands. This is called "splinting." Doing this helps protect your incision. It also decreases belly discomfort.  If you are being admitted to the hospital  overnight, leave your suitcase in the car. After surgery it may be brought to your room.  In case of increased patient census, it may be necessary for you, the patient, to continue your postoperative care in the Same Day Surgery department.  If you are being discharged the day of surgery, you will not be allowed to drive home. You will need a responsible individual to drive you home and stay with you for 24 hours after surgery.   If you are taking public transportation, you will need to have a responsible individual with you.  Please call the Pre-admissions Testing Dept. at [phone] if you have any questions about these instructions.  Surgery Visitation Policy:  Patients having surgery or a procedure may have two visitors.  Children under the age of 62 must have an adult with them who is not the patient.  Inpatient Visitation:    Visiting hours are 7 a.m. to 8 p.m. Up to four visitors are allowed at one time in a patient room. The visitors may rotate out with other people during the day.  One visitor age 17 or older may stay with the patient overnight and must be in the room by 8 p.m.   Pre-operative 5 CHG Bath Instructions   You can play a key role in reducing the risk of infection after surgery. Your skin needs to be as free of germs as possible. You can reduce the number of germs on your skin by washing with CHG (chlorhexidine gluconate) soap before surgery. CHG is an antiseptic soap that kills germs and continues to kill germs even after washing.   DO NOT use if you have an allergy to chlorhexidine/CHG or antibacterial soaps. If your skin becomes reddened or irritated, stop using the CHG and notify one of our RNs at [phone].   Please shower with the CHG soap starting 4 days before surgery using the following schedule: begin on 06/28 - 07/02.    Please keep in mind the following:  DO NOT shave, including legs and underarms, starting the day of your first shower.   You  may shave your face at any point before/day of surgery.  Place clean sheets on your bed the day you start using CHG soap. Use a clean washcloth (not used since being washed) for each shower. DO NOT sleep with pets once you start using the CHG.   CHG Shower Instructions:  If you choose to wash your hair and private area, wash first with your normal shampoo/soap.  After you use shampoo/soap, rinse your hair and body thoroughly to remove shampoo/soap residue.  Turn the water OFF and apply about 3 tablespoons (45 ml) of CHG soap to a CLEAN washcloth.  Apply CHG soap ONLY FROM YOUR NECK DOWN TO YOUR TOES (washing for 3-5 minutes)  DO NOT use CHG soap on face, private areas, open wounds, or sores.  Pay special attention to the area where your surgery is being performed.  If you are having back surgery, having someone wash your back for you may be helpful. Wait 2 minutes after CHG soap is applied, then you may rinse off the CHG  soap.  Pat dry with a clean towel  Put on clean clothes/pajamas   If you choose to wear lotion, please use ONLY the CHG-compatible lotions on the back of this paper.     Additional instructions for the day of surgery: DO NOT APPLY any lotions, deodorants, cologne, or perfumes.   Put on clean/comfortable clothes.  Brush your teeth.  Ask your nurse before applying any prescription medications to the skin.      CHG Compatible Lotions   Aveeno Moisturizing lotion  Cetaphil Moisturizing Cream  Cetaphil Moisturizing Lotion  Clairol Herbal Essence Moisturizing Lotion, Dry Skin  Clairol Herbal Essence Moisturizing Lotion, Extra Dry Skin  Clairol Herbal Essence Moisturizing Lotion, Normal Skin  Curel Age Defying Therapeutic Moisturizing Lotion with Alpha Hydroxy  Curel Extreme Care Body Lotion  Curel Soothing Hands Moisturizing Hand Lotion  Curel Therapeutic Moisturizing Cream, Fragrance-Free  Curel Therapeutic Moisturizing Lotion, Fragrance-Free  Curel Therapeutic  Moisturizing Lotion, Original Formula  Eucerin Daily Replenishing Lotion  Eucerin Dry Skin Therapy Plus Alpha Hydroxy Crme  Eucerin Dry Skin Therapy Plus Alpha Hydroxy Lotion  Eucerin Original Crme  Eucerin Original Lotion  Eucerin Plus Crme Eucerin Plus Lotion  Eucerin TriLipid Replenishing Lotion  Keri Anti-Bacterial Hand Lotion  Keri Deep Conditioning Original Lotion Dry Skin Formula Softly Scented  Keri Deep Conditioning Original Lotion, Fragrance Free Sensitive Skin Formula  Keri Lotion Fast Absorbing Fragrance Free Sensitive Skin Formula  Keri Lotion Fast Absorbing Softly Scented Dry Skin Formula  Keri Original Lotion  Keri Skin Renewal Lotion Keri Silky Smooth Lotion  Keri Silky Smooth Sensitive Skin Lotion  Nivea Body Creamy Conditioning Oil  Nivea Body Extra Enriched Lotion  Nivea Body Original Lotion  Nivea Body Sheer Moisturizing Lotion Nivea Crme  Nivea Skin Firming Lotion  NutraDerm 30 Skin Lotion  NutraDerm Skin Lotion  NutraDerm Therapeutic Skin Cream  NutraDerm Therapeutic Skin Lotion  ProShield Protective Hand Cream  Provon moisturizing lotion  How to Use an Incentive Spirometer  An incentive spirometer is a tool that measures how well you are filling your lungs with each breath. Learning to take long, deep breaths using this tool can help you keep your lungs clear and active. This may help to reverse or lessen your chance of developing breathing (pulmonary) problems, especially infection. You may be asked to use a spirometer: After a surgery. If you have a lung problem or a history of smoking. After a long period of time when you have been unable to move or be active. If the spirometer includes an indicator to show the highest number that you have reached, your health care provider or respiratory therapist will help you set a goal. Keep a log of your progress as told by your health care provider. What are the risks? Breathing too quickly may cause dizziness  or cause you to pass out. Take your time so you do not get dizzy or light-headed. If you are in pain, you may need to take pain medicine before doing incentive spirometry. It is harder to take a deep breath if you are having pain. How to use your incentive spirometer  Sit up on the edge of your bed or on a chair. Hold the incentive spirometer so that it is in an upright position. Before you use the spirometer, breathe out normally. Place the mouthpiece in your mouth. Make sure your lips are closed tightly around it. Breathe in slowly and as deeply as you can through your mouth, causing the piston or the ball  to rise toward the top of the chamber. Hold your breath for 3-5 seconds, or for as long as possible. If the spirometer includes a coach indicator, use this to guide you in breathing. Slow down your breathing if the indicator goes above the marked areas. Remove the mouthpiece from your mouth and breathe out normally. The piston or ball will return to the bottom of the chamber. Rest for a few seconds, then repeat the steps 10 or more times. Take your time and take a few normal breaths between deep breaths so that you do not get dizzy or light-headed. Do this every 1-2 hours when you are awake. If the spirometer includes a goal marker to show the highest number you have reached (best effort), use this as a goal to work toward during each repetition. After each set of 10 deep breaths, cough a few times. This will help to make sure that your lungs are clear. If you have an incision on your chest or abdomen from surgery, place a pillow or a rolled-up towel firmly against the incision when you cough. This can help to reduce pain while taking deep breaths and coughing. General tips When you are able to get out of bed: Walk around often. Continue to take deep breaths and cough in order to clear your lungs. Keep using the incentive spirometer until your health care provider says it is okay to stop  using it. If you have been in the hospital, you may be told to keep using the spirometer at home. Contact a health care provider if: You are having difficulty using the spirometer. You have trouble using the spirometer as often as instructed. Your pain medicine is not giving enough relief for you to use the spirometer as told. You have a fever. Get help right away if: You develop shortness of breath. You develop a cough with bloody mucus from the lungs. You have fluid or blood coming from an incision site after you cough. Summary An incentive spirometer is a tool that can help you learn to take long, deep breaths to keep your lungs clear and active. You may be asked to use a spirometer after a surgery, if you have a lung problem or a history of smoking, or if you have been inactive for a long period of time. Use your incentive spirometer as instructed every 1-2 hours while you are awake. If you have an incision on your chest or abdomen, place a pillow or a rolled-up towel firmly against your incision when you cough. This will help to reduce pain. Get help right away if you have shortness of breath, you cough up bloody mucus, or blood comes from your incision when you cough. This information is not intended to replace advice given to you by your health care provider. Make sure you discuss any questions you have with your health care provider. Document Revised: 01/15/2020 Document Reviewed: 01/15/2020 Elsevier Patient Education  2023 [employer]  Preoperative Educational Videos for Total Hip, Knee and Shoulder Replacements  To better prepare for surgery, please view our videos that explain the physical activity and discharge planning required to have the best surgical recovery at [facility].  [email or web address]  Questions? Call [phone] or email [email or web address]

## 2023-05-06 ENCOUNTER — Encounter: Payer: Self-pay | Admitting: Surgery

## 2023-05-06 NOTE — Progress Notes (Signed)
Perioperative / Anesthesia Services  Pre-Admission Testing Clinical Review / Preoperative Anesthesia Consult  Date: 05/06/23  Patient Demographics:  Name: Candace Juarez DOB:   01-19-1951 MRN:   [id number]  Planned Surgical Procedure(s):    Case: [id number] Date/Time: 05/11/23 0715   Procedure: TOTAL HIP ARTHROPLASTY (Right: Hip)    Anesthesia type: Choice   Pre-op diagnosis: PRIMARY OSTEOARTHRITIS OF RIGHT HIP.   Location: [facility] OR ROOM 03 / [facility]   Surgeons: Christena Flake, MD   NOTE: Available PAT nursing documentation and vital signs have been reviewed. Clinical nursing staff has updated patient's PMH/PSHx, current medication list, and drug allergies/intolerances to ensure comprehensive history available to assist in medical decision making as it pertains to the aforementioned surgical procedure and anticipated anesthetic course. Extensive review of available clinical information personally performed. [place] PMH and PSHx updated with any diagnoses/procedures that  may have been inadvertently omitted during her intake with the pre-admission testing department's nursing staff.  Clinical Discussion:  Candace Juarez is a 72 y.o. female who is submitted for pre-surgical anesthesia review and clearance prior to her undergoing the above procedure. Patient has never been a smoker. Pertinent PMH includes: CAD, CHF, HTN, HLD, hypothyroidism, CKD, GERD (no daily Tx), hiatal hernia, gastric ulcers, SLE, OA, RA, cervical DDD, Sjogren's disease, chronic pain syndrome, chronic opioid therapy, chronic opioid-induced constipation, anxiety (on BZO), depression.  Patient is followed by cardiology Darrold Junker, MD). She was last seen in the cardiology clinic on 04/19/2023; notes reviewed. At the time of her clinic visit, patient doing well overall from a cardiovascular perspective.  Patient reporting mild chronic peripheral edema.  Symptoms stable and at baseline on prescribed diuretic  therapy. Patient denied any chest pain, shortness of breath, PND, orthopnea, palpitations, significant peripheral edema, weakness, fatigue, vertiginous symptoms, or presyncope/syncope. Patient with a past medical history significant for cardiovascular diagnoses. Documented physical exam was grossly benign, providing no evidence of acute exacerbation and/or decompensation of the patient's known cardiovascular conditions.  Coronary CTA was performed on 10/08/2021 revealing a coronary calcium score of 34.1.  This placed patient in the 39th percentile for age/sex, race matched control.  FFR analysis was not performed.  Findings suggestive of a <25% stenosis in the proximal LAD.  Most recent TTE was performed on 12/09/2021 revealing a normal left ventricular systolic function with an EF of >55%.  There were no regional wall motion abnormalities. Left ventricular diastolic Doppler parameters consistent with abnormal relaxation (G1DD).  Right ventricular size and function normal.  There was trivial to mild mitral, tricuspid, and pulmonary valve regurgitation.  All transvalvular gradients were noted be normal providing no evidence suggestive of valvular stenosis.  Aorta normal in size with no evidence of aneurysmal dilatation.  Blood pressure well controlled at 124/68 mmHg on currently prescribed ARB (losartan), beta-blocker (metoprolol succinate), and diuretic (torsemide) therapies.  Patient is not on any type of lipid-lowering therapies for her HLD diagnosis and further ASCVD prevention.  She is not diabetic.  Patient does not have an OSAH diagnosis.  Functional capacity somewhat limited by patient's age, chronic pain, and multiple medical comorbidities.  With that said, patient still felt to be able to achieve at least 4 METS of physical activity without experiencing any significant degrees of angina/anginal equivalent symptoms.  No changes were made to her medication regimen.  Patient to follow-up with outpatient  cardiology in 6 months or sooner if needed.  Candace Juarez is scheduled for an elective TOTAL HIP ARTHROPLASTY (Right: Hip) on  05/11/2023 with Dr. Leron Croak, MD.  Given patient's past medical history significant for cardiovascular diagnoses, presurgical cardiac clearance was sought by the PAT team. Per cardiology, "this patient is optimized for surgery and may proceed with the planned procedural course with a LOW risk of significant perioperative cardiovascular complications".  In review of her medication reconciliation, the patient is not noted to be taking any type of anticoagulation or antiplatelet therapies that would need to be held during her perioperative course.  Patient reports previous perioperative complications with anesthesia in the past.  Patient has experienced (+) early emergence from general anesthesia in the past during a remote hip surgery. In review of the available records, it is noted that patient underwent a general anesthetic course here at [facility] ([facility] in 10/2020 without documented complications.      04/30/2023    9:14 AM 04/22/2023    2:14 PM 01/12/2023    1:38 PM  Vitals with BMI  Height  5\' 2"  5\' 2"   Weight 233 lbs 11 oz 236 lbs 235 lbs  BMI 42.73 43.15 42.97  Systolic 138 142 161  Diastolic 66 48 62  Pulse 69 74 68    Providers/Specialists:   NOTE: Primary physician provider listed below. Patient may have been seen by APP or partner within same practice.   PROVIDER ROLE / SPECIALTY LAST OV  Poggi, Excell Seltzer, MD Orthopedics (Surgeon) 05/03/2023  Enid Baas, MD Primary Care Provider 03/31/2023  Marcina Millard, MD Cardiology 04/19/2023  Gerrie Nordmann, MD Rheumatology 03/15/2023  Edward Jolly, MD Pain Management 04/22/2023   Allergies:  Rifampin, Sulfa antibiotics, Cephalexin, Nsaids, and Sulfur  Current Home Medications:   No current facility-administered medications for this encounter.    Abatacept  (ORENCIA IV)   acetaminophen (TYLENOL) 500 MG tablet   ALPRAZolam (XANAX) 0.5 MG tablet   amoxicillin (AMOXIL) 500 MG capsule   buPROPion (WELLBUTRIN XL) 300 MG 24 hr tablet   cetirizine (ZYRTEC) 10 MG tablet   cholecalciferol (VITAMIN D3) 25 MCG (1000 UNIT) tablet   cycloSPORINE (RESTASIS) 0.05 % ophthalmic emulsion   denosumab (PROLIA) 60 MG/ML SOSY injection   diclofenac Sodium (VOLTAREN) 1 % GEL   diphenoxylate-atropine (LOMOTIL) 2.5-0.025 MG tablet   estradiol (ESTRACE) 2 MG tablet   fluticasone (FLONASE) 50 MCG/ACT nasal spray   HYDROcodone-acetaminophen (NORCO) 7.5-325 MG tablet   [START ON 05/26/2023] HYDROcodone-acetaminophen (NORCO) 7.5-325 MG tablet   [START ON 06/25/2023] HYDROcodone-acetaminophen (NORCO) 7.5-325 MG tablet   hydroxychloroquine (PLAQUENIL) 200 MG tablet   levothyroxine (SYNTHROID) 125 MCG tablet   losartan (COZAAR) 50 MG tablet   Metoprolol Succinate 25 MG CS24   metroNIDAZOLE (METROCREAM) 0.75 % cream   mupirocin 2% oint-hydrocortisone 2.5% cream-nystatin cream-zinc oxide 13% oint 1:1:1:5 mixture   NYSTATIN powder   pantoprazole (PROTONIX) 40 MG tablet   Polyethyl Glycol-Propyl Glycol (SYSTANE ULTRA OP)   pseudoephedrine (SUDAFED) 120 MG 12 hr tablet   simethicone (MYLICON) 125 MG chewable tablet   torsemide (DEMADEX) 20 MG tablet   triamcinolone cream (KENALOG) 0.1 %   venlafaxine XR (EFFEXOR-XR) 75 MG 24 hr capsule   History:   Past Medical History:  Diagnosis Date   Anxiety    a.) on BZO (alproazolam) PRN   Arthritis    CAD (coronary artery disease) 10/08/2021   a.) cCTA 10/08/2021: Ca2+ 34.1 (39th percentile for age/sex/race match control). No FFR analysis performed.   CHF (congestive heart failure) (HCC) 08/27/2021   a.) TTE 08/27/2021: EF 35%, glob HK,  mild MAC, triv PR, mild TR, mod MR, G1DD; b.) cCTA 10/08/2021: EF 66%; c,) TTE 12/09/2021: EF >55%, no RWMAs, mild MR/TR, triv PR, G1DD   Chicken pox    Chronic kidney disease    Chronic pain  syndrome    a.) followed by pain management   Chronic, continuous use of opioids    a.) followed by pain management   Complication of anesthesia    a.) early emergence from anesthesia during remote hip surgery   DDD (degenerative disc disease), cervical    Depression    Gastric ulcer    GERD (gastroesophageal reflux disease)    History of bilateral cataract extraction    History of hiatal hernia    HLD (hyperlipidemia)    HTN (hypertension)    Hypothyroidism    Measles    Mumps    Osteoporosis    a.) on denosumab injections   Rheumatoid arthritis (HCC)    a.) on hydroxychloroquine + abatacept   RLS (restless legs syndrome)    Rosacea    Sjogren's disease (HCC)    a.) uses cyclosporine gtts for associated dry eyes   SLE (systemic lupus erythematosus) (HCC)    Therapeutic opioid-induced constipation (OIC)    a.) uses diphenoxylate-atropine PRN   Past Surgical History:  Procedure Laterality Date   BREAST ENHANCEMENT SURGERY  2004   CATARACT EXTRACTION Bilateral    CHOLECYSTECTOMY     CHOLESTEATOMA EXCISION Right    x2   COLONOSCOPY     COMBINED AUGMENTATION MAMMAPLASTY AND ABDOMINOPLASTY  1978   CYSTOCELE REPAIR N/A 10/24/2020   Procedure: ANTERIOR REPAIR (CYSTOCELE);  Surgeon: Christeen Douglas, MD;  Location: [facility] ORS;  Service: Gynecology;  Laterality: N/A;   ENDOVENOUS ABLATION SAPHENOUS VEIN W/ LASER N/A    HIP ARTHROPLASTY Left    KNEE ARTHROPLASTY Left    KNEE ARTHROSCOPY Right 2010   LAPAROSCOPIC INCISIONAL / UMBILICAL / VENTRAL HERNIA REPAIR  2007   LIMB SPARING RESECTION HIP W/ SADDLE JOINT REPLACEMENT  2008   NASAL ENDOSCOPY  2009   TOTAL LAPAROSCOPIC HYSTERECTOMY WITH BILATERAL SALPINGO OOPHORECTOMY Bilateral 10/24/2020   Procedure: TOTAL LAPAROSCOPIC HYSTERECTOMY WITH BILATERAL SALPINGO OOPHORECTOMY;  Surgeon: Christeen Douglas, MD;  Location: [facility] ORS;  Service: Gynecology;  Laterality: Bilateral;   TUBAL LIGATION Bilateral 1975   UPPER GASTROINTESTINAL  ENDOSCOPY     No family history on file. Social History   Tobacco Use   Smoking status: Never   Smokeless tobacco: Never  Vaping Use   Vaping Use: Never used  Substance Use Topics   Alcohol use: Never   Drug use: Never    Pertinent Clinical Results:  LABS:   No visits with results within 3 Day(s) from this visit.  Latest known visit with results is:  Hospital Outpatient Visit on 04/30/2023  Component Date Value Ref Range Status   MRSA, PCR 04/30/2023 NEGATIVE  NEGATIVE Final   Staphylococcus aureus 04/30/2023 NEGATIVE  NEGATIVE Final   Comment: (NOTE) The Xpert SA Assay (FDA approved for NASAL specimens in patients 23 years of age and older), is one component of a comprehensive surveillance program. It is not intended to diagnose infection nor to guide or monitor treatment. Performed at [facility], [address] [place], [place] [address]    WBC 04/30/2023 8.2  4.0 - 10.5 K/uL Final   RBC 04/30/2023 4.00  3.87 - 5.11 MIL/uL Final   Hemoglobin 04/30/2023 12.0  12.0 - 15.0 g/dL Final   HCT 91/47/8295 37.5  36.0 -  46.0 % Final   MCV 04/30/2023 93.8  80.0 - 100.0 fL Final   MCH 04/30/2023 30.0  26.0 - 34.0 pg Final   MCHC 04/30/2023 32.0  30.0 - 36.0 g/dL Final   RDW 54/07/8118 14.2  11.5 - 15.5 % Final   Platelets 04/30/2023 319  150 - 400 K/uL Final   nRBC 04/30/2023 0.0  0.0 - 0.2 % Final   Neutrophils Relative % 04/30/2023 70  % Final   Neutro Abs 04/30/2023 5.8  1.7 - 7.7 K/uL Final   Lymphocytes Relative 04/30/2023 23  % Final   Lymphs Abs 04/30/2023 1.8  0.7 - 4.0 K/uL Final   Monocytes Relative 04/30/2023 6  % Final   Monocytes Absolute 04/30/2023 0.5  0.1 - 1.0 K/uL Final   Eosinophils Relative 04/30/2023 1  % Final   Eosinophils Absolute 04/30/2023 0.1  0.0 - 0.5 K/uL Final   Basophils Relative 04/30/2023 0  % Final   Basophils Absolute 04/30/2023 0.0  0.0 - 0.1 K/uL Final   Immature Granulocytes 04/30/2023 0  % Final   Abs Immature  Granulocytes 04/30/2023 0.02  0.00 - 0.07 K/uL Final   Performed at [facility], [address] [place], [place] [address]   Sodium 04/30/2023 133 (L)  135 - 145 mmol/L Final   Potassium 04/30/2023 4.9  3.5 - 5.1 mmol/L Final   Chloride 04/30/2023 93 (L)  98 - 111 mmol/L Final   CO2 04/30/2023 28  22 - 32 mmol/L Final   Glucose, Bld 04/30/2023 93  70 - 99 mg/dL Final   Glucose reference range applies only to samples taken after fasting for at least 8 hours.   BUN 04/30/2023 52 (H)  8 - 23 mg/dL Final   Creatinine, Ser 04/30/2023 1.89 (H)  0.44 - 1.00 mg/dL Final   Calcium 95/62/1308 9.2  8.9 - 10.3 mg/dL Final   Total Protein 65/78/4696 7.1  6.5 - 8.1 g/dL Final   Albumin 29/52/8413 3.7  3.5 - 5.0 g/dL Final   AST 24/40/1027 21  15 - 41 U/L Final   ALT 04/30/2023 20  0 - 44 U/L Final   Alkaline Phosphatase 04/30/2023 66  38 - 126 U/L Final   Total Bilirubin 04/30/2023 0.2 (L)  0.3 - 1.2 mg/dL Final   GFR, Estimated 04/30/2023 28 (L)  >60 mL/min Final   Comment: (NOTE) Calculated using the CKD-EPI Creatinine Equation (2021)    Anion gap 04/30/2023 12  5 - 15 Final   Performed at [facility], [address] [place], [place] [address]   Color, Urine 04/30/2023 YELLOW (A)  YELLOW Final   APPearance 04/30/2023 HAZY (A)  CLEAR Final   Specific Gravity, Urine 04/30/2023 1.016  1.005 - 1.030 Final   pH 04/30/2023 5.0  5.0 - 8.0 Final   Glucose, UA 04/30/2023 NEGATIVE  NEGATIVE mg/dL Final   Hgb urine dipstick 04/30/2023 NEGATIVE  NEGATIVE Final   Bilirubin Urine 04/30/2023 NEGATIVE  NEGATIVE Final   Ketones, ur 04/30/2023 NEGATIVE  NEGATIVE mg/dL Final   Protein, ur 44/01/4741 NEGATIVE  NEGATIVE mg/dL Final   Nitrite 59/56/3875 NEGATIVE  NEGATIVE Final   Leukocytes,Ua 04/30/2023 NEGATIVE  NEGATIVE Final   Performed at [facility], [address] [place], [place] [address]   ABO/RH(D) 04/30/2023 A POS   Final   Antibody Screen 04/30/2023 NEG   Final    Sample Expiration 04/30/2023 05/14/2023,2359   Final   Extend sample reason 04/30/2023    Final  Value:NO TRANSFUSIONS OR PREGNANCY IN THE PAST 3 MONTHS Performed at [facility], [address] [place], [place] [address]     ECG: Date: 04/30/2023 Time ECG obtained: 1041 PM Rate: 60 bpm Rhythm: normal sinus Axis (leads I and aVF): Normal Intervals: PR 148 ms. QRS 92 ms. QTc 412 ms. ST segment and T wave changes: No evidence of acute ST segment elevation or depression Comparison: Similar to previous tracing obtained on 09/06/2020   IMAGING / PROCEDURES: DG HIP UNILAT W OR W/O PELVIS 2-3 VIEWS RIGHT performed on 11/13/2022 Degenerative changes of RIGHT hip joint and lower lumbar spine. LEFT hip prosthesis.  MR CERVICAL SPINE WO CONTRAST performed on 10/07/2022 Multilevel degenerative changes of the cervical spine, worst from C3-C7: C3-C4: Moderate bilateral neural foraminal stenosis. No spinal canal stenosis. C4-C5: Moderate left and mild right neural foraminal stenosis. Minimal spinal canal narrowing. C5-C6: Moderate left and mild right neural foraminal stenosis. Minimal spinal canal narrowing. C6-C7: Moderate left and mild-to-moderate right neural foraminal stenosis. No spinal canal stenosis.  TRANSTHORACIC ECHOCARDIOGRAM performed on 12/09/2021 Normal left ventricular systolic function with an EF of >55% No regional wall motion abnormalities Left ventricular diastolic Doppler parameters consistent with abnormal relaxation (G1DD). Trivial PR Mild TR and MR No AR Normal gradients; no valvular stenosis No pericardial effusion  CT HEART ANGIOGRAM WITH FFRCT performed on 10/08/2021 Minimal focal calcified plaque in the proximal LAD with less than 25% stenosis. No other coronary artery plaque or stenosis. CAD-RADS: 1/P1  Calcium score: 34.1. Total calcium score is at the 39th percentile adjusted for age and gender.  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  Qualitatively normal left ventricle ejection fraction.   Impression and Plan:  Candace Juarez has been referred for pre-anesthesia review and clearance prior to her undergoing the planned anesthetic and procedural courses. Available labs, pertinent testing, and imaging results were personally reviewed by me in preparation for upcoming operative/procedural course. [facility] medical record has been updated following extensive record review and patient interview with PAT staff.   This patient has been appropriately cleared by cardiology with an overall LOW risk of experiencing significant perioperative cardiovascular complications. Based on clinical review performed today (05/06/23), barring any significant acute changes in the patient's overall condition, it is anticipated that she will be able to proceed with the planned surgical intervention. Any acute changes in clinical condition may necessitate her procedure being postponed and/or cancelled. Patient will meet with anesthesia team (MD and/or CRNA) on the day of her procedure for preoperative evaluation/assessment. Questions regarding anesthetic course will be fielded at that time.   Pre-surgical instructions were reviewed with the patient during her PAT appointment, and questions were fielded to satisfaction by PAT clinical staff. She has been instructed on which medications that she will need to hold prior to surgery, as well as the ones that have been deemed safe/appropriate to take on the day of her procedure. As part of the general education provided by PAT, patient made aware both verbally and in writing, that she would need to abstain from the use of any illegal substances during her perioperative course.  She was advised that failure to follow the provided instructions could necessitate case cancellation or result in serious perioperative complications up to  and including death. Patient encouraged to contact PAT and/or her surgeon's office to discuss any questions or concerns that may arise prior to surgery; verbalized understanding.  Quentin Mulling, MSN, APRN, FNP-C, CEN [facility]  Peri-operative Services Nurse Practitioner Phone: [phone] Fax: [phone] 05/06/23 11:48 AM  NOTE: This note has been prepared using Dragon dictation software. Despite my best ability to proofread, there is always the potential that unintentional transcriptional errors may still occur from this process.

## 2023-05-10 MED ORDER — LACTATED RINGERS IV SOLN: INTRAVENOUS | Status: DC

## 2023-05-10 MED ORDER — CEFAZOLIN SODIUM-DEXTROSE 2-4 GM/100ML-% IV SOLN
2.0000 g | INTRAVENOUS | Status: AC
  Administered 2023-05-11: 2 g via INTRAVENOUS

## 2023-05-10 MED ORDER — ORAL CARE MOUTH RINSE: 15.0000 mL | Freq: Once | OROMUCOSAL | Status: AC

## 2023-05-10 MED ORDER — CHLORHEXIDINE GLUCONATE 0.12 % MT SOLN
15.0000 mL | Freq: Once | OROMUCOSAL | Status: AC
  Administered 2023-05-11: 15 mL via OROMUCOSAL

## 2023-05-11 ENCOUNTER — Other Ambulatory Visit: Payer: Self-pay

## 2023-05-11 ENCOUNTER — Ambulatory Visit: Payer: Medicare Other | Admitting: Urgent Care

## 2023-05-11 ENCOUNTER — Encounter
Admission: RE | Disposition: A | Discharge status: Home / Self Care | Payer: Self-pay | Source: Ambulatory Visit | Attending: Surgery

## 2023-05-11 ENCOUNTER — Ambulatory Visit: Payer: Medicare Other

## 2023-05-11 ENCOUNTER — Ambulatory Visit
Admission: RE | Admit: 2023-05-11 | Discharge: 2023-05-12 | Disposition: A | Discharge status: Home / Self Care | Payer: Medicare Other | Source: Ambulatory Visit | Attending: Surgery | Admitting: Surgery

## 2023-05-11 ENCOUNTER — Encounter: Payer: Self-pay | Admitting: Surgery

## 2023-05-11 DIAGNOSIS — M329 Systemic lupus erythematosus, unspecified: Secondary | ICD-10-CM | POA: Insufficient documentation

## 2023-05-11 DIAGNOSIS — G894 Chronic pain syndrome: Secondary | ICD-10-CM | POA: Insufficient documentation

## 2023-05-11 DIAGNOSIS — Z79891 Long term (current) use of opiate analgesic: Secondary | ICD-10-CM | POA: Diagnosis not present

## 2023-05-11 DIAGNOSIS — Z96641 Presence of right artificial hip joint: Secondary | ICD-10-CM

## 2023-05-11 DIAGNOSIS — E039 Hypothyroidism, unspecified: Secondary | ICD-10-CM | POA: Insufficient documentation

## 2023-05-11 DIAGNOSIS — E785 Hyperlipidemia, unspecified: Secondary | ICD-10-CM | POA: Insufficient documentation

## 2023-05-11 DIAGNOSIS — K449 Diaphragmatic hernia without obstruction or gangrene: Secondary | ICD-10-CM | POA: Insufficient documentation

## 2023-05-11 DIAGNOSIS — K219 Gastro-esophageal reflux disease without esophagitis: Secondary | ICD-10-CM | POA: Insufficient documentation

## 2023-05-11 DIAGNOSIS — M3500 Sicca syndrome, unspecified: Secondary | ICD-10-CM | POA: Diagnosis not present

## 2023-05-11 DIAGNOSIS — I2510 Atherosclerotic heart disease of native coronary artery without angina pectoris: Secondary | ICD-10-CM | POA: Insufficient documentation

## 2023-05-11 DIAGNOSIS — F32A Depression, unspecified: Secondary | ICD-10-CM | POA: Insufficient documentation

## 2023-05-11 DIAGNOSIS — M1611 Unilateral primary osteoarthritis, right hip: Secondary | ICD-10-CM | POA: Diagnosis not present

## 2023-05-11 DIAGNOSIS — M069 Rheumatoid arthritis, unspecified: Secondary | ICD-10-CM | POA: Insufficient documentation

## 2023-05-11 DIAGNOSIS — N189 Chronic kidney disease, unspecified: Secondary | ICD-10-CM | POA: Diagnosis not present

## 2023-05-11 DIAGNOSIS — I509 Heart failure, unspecified: Secondary | ICD-10-CM | POA: Diagnosis not present

## 2023-05-11 DIAGNOSIS — Z6841 Body Mass Index (BMI) 40.0 and over, adult: Secondary | ICD-10-CM | POA: Insufficient documentation

## 2023-05-11 DIAGNOSIS — I130 Hypertensive heart and chronic kidney disease with heart failure and stage 1 through stage 4 chronic kidney disease, or unspecified chronic kidney disease: Secondary | ICD-10-CM | POA: Insufficient documentation

## 2023-05-11 DIAGNOSIS — F419 Anxiety disorder, unspecified: Secondary | ICD-10-CM | POA: Diagnosis not present

## 2023-05-11 HISTORY — DX: Adverse effect of other opioids, initial encounter: T40.2X5A

## 2023-05-11 HISTORY — DX: Opioid use, unspecified, uncomplicated: F11.90

## 2023-05-11 HISTORY — DX: Essential (primary) hypertension: I10

## 2023-05-11 HISTORY — DX: Measles without complication: B05.9

## 2023-05-11 HISTORY — DX: Gastro-esophageal reflux disease without esophagitis: K21.9

## 2023-05-11 HISTORY — DX: Age-related osteoporosis without current pathological fracture: M81.0

## 2023-05-11 HISTORY — DX: Cataract extraction status, right eye: Z98.42

## 2023-05-11 HISTORY — DX: Depression, unspecified: F32.A

## 2023-05-11 HISTORY — DX: Varicella without complication: B01.9

## 2023-05-11 HISTORY — DX: Gastric ulcer, unspecified as acute or chronic, without hemorrhage or perforation: K25.9

## 2023-05-11 HISTORY — PX: TOTAL HIP ARTHROPLASTY: SHX124

## 2023-05-11 HISTORY — DX: Systemic lupus erythematosus, unspecified: M32.9

## 2023-05-11 HISTORY — DX: Sjogren syndrome, unspecified: M35.00

## 2023-05-11 HISTORY — DX: Chronic pain syndrome: G89.4

## 2023-05-11 HISTORY — DX: Mumps without complication: B26.9

## 2023-05-11 HISTORY — DX: Hyperlipidemia, unspecified: E78.5

## 2023-05-11 HISTORY — DX: Other cervical disc degeneration, unspecified cervical region: M50.30

## 2023-05-11 HISTORY — DX: Cataract extraction status, right eye: Z98.41

## 2023-05-11 SURGERY — ARTHROPLASTY, HIP, TOTAL,POSTERIOR APPROACH
Anesthesia: Spinal | Site: Hip | Laterality: Right

## 2023-05-11 MED ORDER — METOPROLOL SUCCINATE ER 25 MG PO TB24
50.0000 mg | ORAL_TABLET | Freq: Every day | ORAL | Status: DC
  Administered 2023-05-11 – 2023-05-12 (×2): 50 mg via ORAL
  Filled 2023-05-11 (×2): qty 2

## 2023-05-11 MED ORDER — CEFAZOLIN SODIUM-DEXTROSE 2-4 GM/100ML-% IV SOLN
INTRAVENOUS | Status: AC
  Filled 2023-05-11: qty 100

## 2023-05-11 MED ORDER — DIPHENOXYLATE-ATROPINE 2.5-0.025 MG PO TABS: 1.0000 | ORAL_TABLET | Freq: Every day | ORAL | Status: DC | PRN

## 2023-05-11 MED ORDER — ACETAMINOPHEN 10 MG/ML IV SOLN
INTRAVENOUS | Status: DC | PRN
  Administered 2023-05-11: 1000 mg via INTRAVENOUS

## 2023-05-11 MED ORDER — PANTOPRAZOLE SODIUM 40 MG PO TBEC
40.0000 mg | DELAYED_RELEASE_TABLET | Freq: Every day | ORAL | Status: DC
  Administered 2023-05-12: 40 mg via ORAL

## 2023-05-11 MED ORDER — FENTANYL CITRATE (PF) 100 MCG/2ML IJ SOLN
INTRAMUSCULAR | Status: AC
  Filled 2023-05-11: qty 2

## 2023-05-11 MED ORDER — BISACODYL 10 MG RE SUPP: 10.0000 mg | Freq: Every day | RECTAL | Status: DC | PRN

## 2023-05-11 MED ORDER — SUGAMMADEX SODIUM 200 MG/2ML IV SOLN
INTRAVENOUS | Status: DC | PRN
  Administered 2023-05-11: 200 mg via INTRAVENOUS

## 2023-05-11 MED ORDER — PHENYLEPHRINE HCL (PRESSORS) 10 MG/ML IV SOLN
INTRAVENOUS | Status: DC | PRN
  Administered 2023-05-11 (×3): 80 ug via INTRAVENOUS
  Administered 2023-05-11: 160 ug via INTRAVENOUS

## 2023-05-11 MED ORDER — CHLORHEXIDINE GLUCONATE 0.12 % MT SOLN
OROMUCOSAL | Status: AC
  Filled 2023-05-11: qty 15

## 2023-05-11 MED ORDER — PROPOFOL 1000 MG/100ML IV EMUL
INTRAVENOUS | Status: AC
  Filled 2023-05-11: qty 100

## 2023-05-11 MED ORDER — DEXAMETHASONE SODIUM PHOSPHATE 10 MG/ML IJ SOLN
INTRAMUSCULAR | Status: DC | PRN
  Administered 2023-05-11: 5 mg via INTRAVENOUS

## 2023-05-11 MED ORDER — METOCLOPRAMIDE HCL 5 MG/ML IJ SOLN: 5.0000 mg | Freq: Three times a day (TID) | INTRAMUSCULAR | Status: DC | PRN

## 2023-05-11 MED ORDER — KETAMINE HCL 10 MG/ML IJ SOLN
INTRAMUSCULAR | Status: DC | PRN
  Administered 2023-05-11: 30 mg via INTRAVENOUS
  Administered 2023-05-11: 20 mg via INTRAVENOUS

## 2023-05-11 MED ORDER — VENLAFAXINE HCL ER 75 MG PO CP24
75.0000 mg | ORAL_CAPSULE | Freq: Every day | ORAL | Status: DC
  Administered 2023-05-12: 75 mg via ORAL
  Filled 2023-05-11: qty 1

## 2023-05-11 MED ORDER — SODIUM CHLORIDE (PF) 0.9 % IJ SOLN: INTRAMUSCULAR | Status: DC | PRN

## 2023-05-11 MED ORDER — ALPRAZOLAM 0.5 MG PO TABS: 0.5000 mg | ORAL_TABLET | Freq: Three times a day (TID) | ORAL | Status: DC | PRN

## 2023-05-11 MED ORDER — GLYCOPYRROLATE 0.2 MG/ML IJ SOLN
INTRAMUSCULAR | Status: AC
  Filled 2023-05-11: qty 1

## 2023-05-11 MED ORDER — DEXAMETHASONE SODIUM PHOSPHATE 10 MG/ML IJ SOLN
INTRAMUSCULAR | Status: AC
  Filled 2023-05-11: qty 1

## 2023-05-11 MED ORDER — KETOROLAC TROMETHAMINE 15 MG/ML IJ SOLN
INTRAMUSCULAR | Status: AC
  Filled 2023-05-11: qty 1

## 2023-05-11 MED ORDER — ACETAMINOPHEN 10 MG/ML IV SOLN
INTRAVENOUS | Status: AC
  Filled 2023-05-11: qty 100

## 2023-05-11 MED ORDER — TRANEXAMIC ACID 1000 MG/10ML IV SOLN
INTRAVENOUS | Status: AC
  Filled 2023-05-11: qty 10

## 2023-05-11 MED ORDER — KETOROLAC TROMETHAMINE 15 MG/ML IJ SOLN
15.0000 mg | Freq: Once | INTRAMUSCULAR | Status: AC
  Administered 2023-05-11: 15 mg via INTRAVENOUS

## 2023-05-11 MED ORDER — DIPHENHYDRAMINE HCL 12.5 MG/5ML PO ELIX: 12.5000 mg | ORAL_SOLUTION | ORAL | Status: DC | PRN

## 2023-05-11 MED ORDER — TRANEXAMIC ACID 1000 MG/10ML IV SOLN
INTRAVENOUS | Status: DC | PRN
  Administered 2023-05-11: 1000 mg via TOPICAL

## 2023-05-11 MED ORDER — FLEET ENEMA 7-19 GM/118ML RE ENEM: 1.0000 | ENEMA | Freq: Once | RECTAL | Status: DC | PRN

## 2023-05-11 MED ORDER — PHENYLEPHRINE 80 MCG/ML (10ML) SYRINGE FOR IV PUSH (FOR BLOOD PRESSURE SUPPORT)
PREFILLED_SYRINGE | INTRAVENOUS | Status: AC
  Filled 2023-05-11: qty 10

## 2023-05-11 MED ORDER — BUPIVACAINE LIPOSOME 1.3 % IJ SUSP
INTRAMUSCULAR | Status: AC
  Filled 2023-05-11: qty 20

## 2023-05-11 MED ORDER — HYDROCODONE-ACETAMINOPHEN 7.5-325 MG PO TABS
ORAL_TABLET | ORAL | Status: AC
  Filled 2023-05-11: qty 1

## 2023-05-11 MED ORDER — FENTANYL CITRATE (PF) 100 MCG/2ML IJ SOLN
25.0000 ug | INTRAMUSCULAR | Status: DC | PRN
  Administered 2023-05-11 (×3): 50 ug via INTRAVENOUS

## 2023-05-11 MED ORDER — CYCLOSPORINE 0.05 % OP EMUL
1.0000 [drp] | Freq: Two times a day (BID) | OPHTHALMIC | Status: DC
  Administered 2023-05-11 – 2023-05-12 (×2): 1 [drp] via OPHTHALMIC
  Filled 2023-05-11 (×2): qty 30

## 2023-05-11 MED ORDER — SODIUM CHLORIDE 0.9 % IR SOLN
Status: DC | PRN
  Administered 2023-05-11: 3000 mL

## 2023-05-11 MED ORDER — BUPIVACAINE HCL (PF) 0.5 % IJ SOLN
INTRAMUSCULAR | Status: AC
  Filled 2023-05-11: qty 10

## 2023-05-11 MED ORDER — ONDANSETRON HCL 4 MG PO TABS: 4.0000 mg | ORAL_TABLET | Freq: Four times a day (QID) | ORAL | Status: DC | PRN

## 2023-05-11 MED ORDER — SUCCINYLCHOLINE CHLORIDE 200 MG/10ML IV SOSY
PREFILLED_SYRINGE | INTRAVENOUS | Status: AC
  Filled 2023-05-11: qty 10

## 2023-05-11 MED ORDER — BUPIVACAINE HCL (PF) 0.5 % IJ SOLN
INTRAMUSCULAR | Status: AC
  Filled 2023-05-11: qty 30

## 2023-05-11 MED ORDER — LOSARTAN POTASSIUM 50 MG PO TABS
50.0000 mg | ORAL_TABLET | Freq: Every day | ORAL | Status: DC
  Administered 2023-05-12: 50 mg via ORAL

## 2023-05-11 MED ORDER — HYDROCODONE-ACETAMINOPHEN 7.5-325 MG PO TABS
ORAL_TABLET | ORAL | Status: AC
  Filled 2023-05-11: qty 2

## 2023-05-11 MED ORDER — PROPOFOL 10 MG/ML IV BOLUS
INTRAVENOUS | Status: DC | PRN
  Administered 2023-05-11: 150 mg via INTRAVENOUS

## 2023-05-11 MED ORDER — MIDAZOLAM HCL 2 MG/2ML IJ SOLN
INTRAMUSCULAR | Status: AC
  Filled 2023-05-11: qty 2

## 2023-05-11 MED ORDER — ROCURONIUM BROMIDE 100 MG/10ML IV SOLN
INTRAVENOUS | Status: DC | PRN
  Administered 2023-05-11: 60 mg via INTRAVENOUS
  Administered 2023-05-11: 10 mg via INTRAVENOUS

## 2023-05-11 MED ORDER — SODIUM CHLORIDE 0.9 % IV SOLN: INTRAVENOUS | Status: DC

## 2023-05-11 MED ORDER — OXYCODONE HCL 5 MG/5ML PO SOLN: 5.0000 mg | Freq: Once | ORAL | Status: AC | PRN

## 2023-05-11 MED ORDER — MIDAZOLAM HCL 2 MG/2ML IJ SOLN
INTRAMUSCULAR | Status: DC | PRN
  Administered 2023-05-11 (×2): 1 mg via INTRAVENOUS

## 2023-05-11 MED ORDER — FENTANYL CITRATE (PF) 100 MCG/2ML IJ SOLN
INTRAMUSCULAR | Status: DC | PRN
  Administered 2023-05-11: 100 ug via INTRAVENOUS

## 2023-05-11 MED ORDER — BUPROPION HCL ER (XL) 150 MG PO TB24
300.0000 mg | ORAL_TABLET | Freq: Every day | ORAL | Status: DC
  Administered 2023-05-12: 300 mg via ORAL
  Filled 2023-05-11: qty 2

## 2023-05-11 MED ORDER — FAMOTIDINE 20 MG PO TABS
ORAL_TABLET | ORAL | Status: AC
  Filled 2023-05-11: qty 1

## 2023-05-11 MED ORDER — OXYCODONE HCL 5 MG PO TABS
ORAL_TABLET | ORAL | Status: AC
  Filled 2023-05-11: qty 1

## 2023-05-11 MED ORDER — KETAMINE HCL 50 MG/5ML IJ SOSY
PREFILLED_SYRINGE | INTRAMUSCULAR | Status: AC
  Filled 2023-05-11: qty 5

## 2023-05-11 MED ORDER — POLYVINYL ALCOHOL 1.4 % OP SOLN
1.0000 [drp] | Freq: Four times a day (QID) | OPHTHALMIC | Status: DC
  Administered 2023-05-11 – 2023-05-12 (×2): 1 [drp] via OPHTHALMIC
  Filled 2023-05-11: qty 15

## 2023-05-11 MED ORDER — LIDOCAINE HCL (PF) 2 % IJ SOLN
INTRAMUSCULAR | Status: AC
  Filled 2023-05-11: qty 5

## 2023-05-11 MED ORDER — 0.9 % SODIUM CHLORIDE (POUR BTL) OPTIME
TOPICAL | Status: DC | PRN
  Administered 2023-05-11: 500 mL

## 2023-05-11 MED ORDER — LIDOCAINE HCL (CARDIAC) PF 100 MG/5ML IV SOSY
PREFILLED_SYRINGE | INTRAVENOUS | Status: DC | PRN
  Administered 2023-05-11: 100 mg via INTRAVENOUS

## 2023-05-11 MED ORDER — OXYCODONE HCL 5 MG PO TABS
5.0000 mg | ORAL_TABLET | Freq: Once | ORAL | Status: AC
  Administered 2023-05-11: 5 mg via ORAL

## 2023-05-11 MED ORDER — TRIAMCINOLONE ACETONIDE 40 MG/ML IJ SUSP
INTRAMUSCULAR | Status: DC | PRN
  Administered 2023-05-11: 93 mL via INTRAMUSCULAR

## 2023-05-11 MED ORDER — KETOROLAC TROMETHAMINE 15 MG/ML IJ SOLN
7.5000 mg | Freq: Four times a day (QID) | INTRAMUSCULAR | Status: DC
  Administered 2023-05-11 – 2023-05-12 (×3): 7.5 mg via INTRAVENOUS

## 2023-05-11 MED ORDER — ONDANSETRON HCL 4 MG/2ML IJ SOLN
INTRAMUSCULAR | Status: AC
  Filled 2023-05-11: qty 2

## 2023-05-11 MED ORDER — APIXABAN 2.5 MG PO TABS
2.5000 mg | ORAL_TABLET | Freq: Two times a day (BID) | ORAL | Status: DC
  Administered 2023-05-12: 2.5 mg via ORAL

## 2023-05-11 MED ORDER — LEVOTHYROXINE SODIUM 125 MCG PO TABS
125.0000 ug | ORAL_TABLET | Freq: Every day | ORAL | Status: DC
  Administered 2023-05-12: 125 ug via ORAL
  Filled 2023-05-11: qty 1

## 2023-05-11 MED ORDER — METOCLOPRAMIDE HCL 10 MG PO TABS: 5.0000 mg | ORAL_TABLET | Freq: Three times a day (TID) | ORAL | Status: DC | PRN

## 2023-05-11 MED ORDER — TORSEMIDE 20 MG PO TABS
20.0000 mg | ORAL_TABLET | Freq: Every day | ORAL | Status: DC
  Administered 2023-05-11 – 2023-05-12 (×2): 20 mg via ORAL
  Filled 2023-05-11 (×2): qty 1

## 2023-05-11 MED ORDER — SIMETHICONE 80 MG PO CHEW: 80.0000 mg | CHEWABLE_TABLET | Freq: Four times a day (QID) | ORAL | Status: DC | PRN

## 2023-05-11 MED ORDER — EPHEDRINE 5 MG/ML INJ
INTRAVENOUS | Status: AC
  Filled 2023-05-11: qty 5

## 2023-05-11 MED ORDER — DOCUSATE SODIUM 100 MG PO CAPS
ORAL_CAPSULE | ORAL | Status: AC
  Filled 2023-05-11: qty 1

## 2023-05-11 MED ORDER — CEFAZOLIN SODIUM-DEXTROSE 2-4 GM/100ML-% IV SOLN
2.0000 g | Freq: Four times a day (QID) | INTRAVENOUS | Status: AC
  Administered 2023-05-11 – 2023-05-12 (×3): 2 g via INTRAVENOUS

## 2023-05-11 MED ORDER — ROCURONIUM BROMIDE 10 MG/ML (PF) SYRINGE
PREFILLED_SYRINGE | INTRAVENOUS | Status: AC
  Filled 2023-05-11: qty 10

## 2023-05-11 MED ORDER — OXYCODONE HCL 5 MG PO TABS
5.0000 mg | ORAL_TABLET | Freq: Once | ORAL | Status: AC | PRN
  Administered 2023-05-11: 5 mg via ORAL

## 2023-05-11 MED ORDER — MAGNESIUM HYDROXIDE 400 MG/5ML PO SUSP: 30.0000 mL | Freq: Every day | ORAL | Status: DC | PRN

## 2023-05-11 MED ORDER — EPINEPHRINE PF 1 MG/ML IJ SOLN
INTRAMUSCULAR | Status: AC
  Filled 2023-05-11: qty 1

## 2023-05-11 MED ORDER — PHENYLEPHRINE HCL-NACL 20-0.9 MG/250ML-% IV SOLN
INTRAVENOUS | Status: AC
  Filled 2023-05-11: qty 250

## 2023-05-11 MED ORDER — PROPOFOL 10 MG/ML IV BOLUS
INTRAVENOUS | Status: AC
  Filled 2023-05-11: qty 40

## 2023-05-11 MED ORDER — ESTRADIOL 0.5 MG PO TABS
1.0000 mg | ORAL_TABLET | Freq: Every day | ORAL | Status: DC
  Administered 2023-05-11 – 2023-05-12 (×2): 1 mg via ORAL
  Filled 2023-05-11 (×3): qty 2

## 2023-05-11 MED ORDER — TRIAMCINOLONE ACETONIDE 40 MG/ML IJ SUSP
INTRAMUSCULAR | Status: AC
  Filled 2023-05-11: qty 2

## 2023-05-11 MED ORDER — MORPHINE SULFATE (PF) 2 MG/ML IV SOLN: 2.0000 mg | INTRAVENOUS | Status: DC | PRN

## 2023-05-11 MED ORDER — ONDANSETRON HCL 4 MG/2ML IJ SOLN
INTRAMUSCULAR | Status: DC | PRN
  Administered 2023-05-11: 4 mg via INTRAVENOUS

## 2023-05-11 MED ORDER — SODIUM CHLORIDE FLUSH 0.9 % IV SOLN
INTRAVENOUS | Status: AC
  Filled 2023-05-11: qty 40

## 2023-05-11 MED ORDER — HYDROCODONE-ACETAMINOPHEN 7.5-325 MG PO TABS
1.0000 | ORAL_TABLET | ORAL | Status: DC | PRN
  Administered 2023-05-11 – 2023-05-12 (×3): 2 via ORAL

## 2023-05-11 MED ORDER — KETOROLAC TROMETHAMINE 30 MG/ML IJ SOLN
INTRAMUSCULAR | Status: AC
  Filled 2023-05-11: qty 1

## 2023-05-11 MED ORDER — ACETAMINOPHEN 325 MG PO TABS: 325.0000 mg | ORAL_TABLET | Freq: Four times a day (QID) | ORAL | Status: DC | PRN

## 2023-05-11 MED ORDER — ONDANSETRON HCL 4 MG/2ML IJ SOLN: 4.0000 mg | Freq: Four times a day (QID) | INTRAMUSCULAR | Status: DC | PRN

## 2023-05-11 MED ORDER — DOCUSATE SODIUM 100 MG PO CAPS
100.0000 mg | ORAL_CAPSULE | Freq: Two times a day (BID) | ORAL | Status: DC
  Administered 2023-05-11 – 2023-05-12 (×3): 100 mg via ORAL

## 2023-05-11 MED ORDER — PHENYLEPHRINE HCL-NACL 20-0.9 MG/250ML-% IV SOLN
INTRAVENOUS | Status: DC | PRN
  Administered 2023-05-11: 40 ug/min via INTRAVENOUS

## 2023-05-11 SURGICAL SUPPLY — 58 items
APL PRP STRL LF DISP 70% ISPRP (MISCELLANEOUS) ×1
BIT DRILL 12.7X2STRG SHNK (BIT) IMPLANT
BIT DRILL 2.0 (BIT) ×1
BIT DRL 12.7X2STRG SHNK (BIT) ×1
BLADE SAGITTAL WIDE XTHICK NO (BLADE) ×1 IMPLANT
BLADE SURG SZ20 CARB STEEL (BLADE) ×1 IMPLANT
CHLORAPREP W/TINT 26 (MISCELLANEOUS) ×1 IMPLANT
DRAPE 3/4 80X56 (DRAPES) ×1 IMPLANT
DRAPE IMP U-DRAPE 54X76 (DRAPES) IMPLANT
DRAPE INCISE IOBAN 66X60 STRL (DRAPES) ×1 IMPLANT
DRAPE SURG 17X11 SM STRL (DRAPES) ×2 IMPLANT
DRESSING PEEL AND PLAC PRVNA20 (GAUZE/BANDAGES/DRESSINGS) IMPLANT
DRSG MEPILEX SACRM 8.7X9.8 (GAUZE/BANDAGES/DRESSINGS) ×1 IMPLANT
DRSG OPSITE POSTOP 4X10 (GAUZE/BANDAGES/DRESSINGS) ×1 IMPLANT
DRSG PEEL AND PLACE PREVENA 20 (GAUZE/BANDAGES/DRESSINGS) ×1
ELECT CAUTERY BLADE 6.4 (BLADE) ×1 IMPLANT
GAUZE 4X4 16PLY ~~LOC~~+RFID DBL (SPONGE) ×1 IMPLANT
GAUZE XEROFORM 1X8 LF (GAUZE/BANDAGES/DRESSINGS) ×1 IMPLANT
GLOVE BIO SURGEON STRL SZ7.5 (GLOVE) ×4 IMPLANT
GLOVE BIO SURGEON STRL SZ8 (GLOVE) ×4 IMPLANT
GLOVE BIOGEL PI IND STRL 8 (GLOVE) ×1 IMPLANT
GLOVE INDICATOR 8.0 STRL GRN (GLOVE) ×1 IMPLANT
GOWN STRL REUS W/ TWL LRG LVL3 (GOWN DISPOSABLE) ×1 IMPLANT
GOWN STRL REUS W/ TWL XL LVL3 (GOWN DISPOSABLE) ×1 IMPLANT
GOWN STRL REUS W/TWL LRG LVL3 (GOWN DISPOSABLE) ×1
GOWN STRL REUS W/TWL XL LVL3 (GOWN DISPOSABLE) ×1
HANDLE YANKAUER SUCT OPEN TIP (MISCELLANEOUS) ×1 IMPLANT
HEAD CERAMIC BIOLOX 32 TP1 -3 (Head) IMPLANT
HOLSTER ELECTROSUGICAL PENCIL (MISCELLANEOUS) ×1 IMPLANT
HOOD PEEL AWAY T7 (MISCELLANEOUS) ×3 IMPLANT
IV NS IRRIG 3000ML ARTHROMATIC (IV SOLUTION) ×1 IMPLANT
KIT TURNOVER KIT A (KITS) ×1 IMPLANT
LINER ACE G7 32 SZC HIGH WALL (Liner) IMPLANT
MANIFOLD NEPTUNE II (INSTRUMENTS) ×1 IMPLANT
NDL FILTER BLUNT 18X1 1/2 (NEEDLE) ×1 IMPLANT
NDL SAFETY ECLIP 18X1.5 (MISCELLANEOUS) ×2 IMPLANT
NDL SPNL 20GX3.5 QUINCKE YW (NEEDLE) ×1 IMPLANT
NEEDLE FILTER BLUNT 18X1 1/2 (NEEDLE) ×1 IMPLANT
NEEDLE SPNL 20GX3.5 QUINCKE YW (NEEDLE) ×1 IMPLANT
PACK HIP PROSTHESIS (MISCELLANEOUS) ×1 IMPLANT
PENCIL SMOKE EVACUATOR (MISCELLANEOUS) ×1 IMPLANT
PULSAVAC PLUS IRRIG FAN TIP (DISPOSABLE) ×1
SHELL ACET G7 3H 48 SZC (Shell) IMPLANT
SPONGE T-LAP 18X18 ~~LOC~~+RFID (SPONGE) ×5 IMPLANT
ST PIN 3/16X9 BAY 6PK (Pin) ×2 IMPLANT
STAPLER SKIN PROX 35W (STAPLE) ×1 IMPLANT
STEM FEM COLLARLESS 10X130X130 (Stem) IMPLANT
SUT TICRON 2-0 30IN 311381 (SUTURE) ×3 IMPLANT
SUT VIC AB 0 CT1 36 (SUTURE) ×1 IMPLANT
SUT VIC AB 1 CT1 36 (SUTURE) ×1 IMPLANT
SUT VIC AB 2-0 CT1 (SUTURE) ×3 IMPLANT
SYR 10ML LL (SYRINGE) ×1 IMPLANT
SYR 20ML LL LF (SYRINGE) ×1 IMPLANT
SYR 30ML LL (SYRINGE) ×2 IMPLANT
TIP FAN IRRIG PULSAVAC PLUS (DISPOSABLE) ×1 IMPLANT
TRAP FLUID SMOKE EVACUATOR (MISCELLANEOUS) ×2 IMPLANT
WATER STERILE IRR 1000ML POUR (IV SOLUTION) ×1 IMPLANT
WATER STERILE IRR 500ML POUR (IV SOLUTION) ×1 IMPLANT

## 2023-05-11 NOTE — Progress Notes (Signed)
Pts BP 114/60, Pt has Losartan 50 mg scheduled, per MD Poggi, hold.

## 2023-05-11 NOTE — Anesthesia Procedure Notes (Signed)
Procedure Name: Intubation Date/Time: 05/11/2023 8:12 AM  Performed by: Morene Crocker, CRNAPre-anesthesia Checklist: Patient identified, Emergency Drugs available, Suction available and Patient being monitored Patient Re-evaluated:Patient Re-evaluated prior to induction Oxygen Delivery Method: Circle system utilized Preoxygenation: Pre-oxygenation with 100% oxygen Induction Type: IV induction Ventilation: Mask ventilation without difficulty and Oral airway inserted - appropriate to patient size Laryngoscope Size: McGraph and 3 Grade View: Grade I Tube type: Oral Tube size: 6.5 mm Number of attempts: 1 Airway Equipment and Method: Stylet and Oral airway Placement Confirmation: ETT inserted through vocal cords under direct vision, positive ETCO2 and breath sounds checked- equal and bilateral Secured at: 21 cm Tube secured with: Tape Dental Injury: Teeth and Oropharynx as per pre-operative assessment  Comments: Atraumatic intubation. Lauren Cozart, SRNA placed ETT under direct supervision. MDA and CRNA at bedside.

## 2023-05-11 NOTE — Evaluation (Signed)
Physical Therapy Evaluation Patient Details Name: Candace Juarez MRN: [id number] DOB: 03/11/1951 Today's Date: 05/11/2023  History of Present Illness  Patient is a 72 year old female with degenerative joint disease right hip, s/p right total hip arthroplasty.  Clinical Impression  Patient is agreeable to PT. She reports intermittently using a cane for ambulation prior to surgery. She lives alone but has supportive family members to help if needed after discharge.  Reviewed hip precautions and tips to maintain with functional mobility. Gait training initiated with improved gait pattern and gait speed with increased ambulation distance. Patient walked around 21ft with rolling walker. Recommend to continue PT to maximize independence and facilitate return to prior level of function.       Assistance Recommended at Discharge PRN  If plan is discharge home, recommend the following:  Can travel by private vehicle  Help with stairs or ramp for entrance;Assist for transportation        Equipment Recommendations None recommended by PT (has RW, elevated toilet seat with arms, and shower chair with arms)  Recommendations for Other Services       Functional Status Assessment Patient has had a recent decline in their functional status and demonstrates the ability to make significant improvements in function in a reasonable and predictable amount of time.     Precautions / Restrictions Precautions Precautions: Posterior Hip;Fall Precaution Booklet Issued: Yes (comment) Restrictions Weight Bearing Restrictions: Yes RLE Weight Bearing: Weight bearing as tolerated      Mobility  Bed Mobility Overal bed mobility: Needs Assistance Bed Mobility: Sit to Supine, Supine to Sit     Supine to sit: Min assist Sit to supine: Min assist   General bed mobility comments: assistance to scoot hips forward in sitting in preparation for transfers. assistance for RLE support to return to bed. education  provided on techniques to maintain hip precuations with mobility    Transfers Overall transfer level: Needs assistance Equipment used: Rolling walker (2 wheels) Transfers: Sit to/from Stand Sit to Stand: Min assist, From elevated surface           General transfer comment: verbal cues for RLE positioning and hand placement with standing    Ambulation/Gait Ambulation/Gait assistance: Min guard, Supervision Gait Distance (Feet): 80 Feet Assistive device: Rolling walker (2 wheels) Gait Pattern/deviations: Step-to pattern, Step-through pattern Gait velocity: decreased     General Gait Details: with initial cues for rolling walker and BLE sequencing, patient demonstrated correct technique. step length improved with increased ambluation distance as well as progression to step through pattern with increasing gait speed.  Stairs            Wheelchair Mobility     Tilt Bed    Modified Rankin (Stroke Patients Only)       Balance Overall balance assessment: Needs assistance Sitting-balance support: Feet supported Sitting balance-Leahy Scale: Fair     Standing balance support: Bilateral upper extremity supported, Reliant on assistive device for balance Standing balance-Leahy Scale: Fair                               Pertinent Vitals/Pain Pain Assessment Pain Assessment: 0-10 Pain Score: 5  Pain Location: R hip Pain Descriptors / Indicators: Discomfort Pain Intervention(s): Limited activity within patient's tolerance, Monitored during session, Premedicated before session, Repositioned    Home Living Family/patient expects to be discharged to:: Private residence Living Arrangements: Alone Available Help at Discharge: Family Type of Home: House  Home Access: Stairs to enter Entrance Stairs-Rails: Right;Left;Can reach both Entrance Stairs-Number of Steps: 3   Home Layout: One level Home Equipment: Agricultural consultant (2 wheels);Hand held shower  head;Shower seat;BSC/3in1;Cane - single point;Other (comment) (uses BSC over the toilet; adjustable bed) Additional Comments: patient has had hip surgery on the left hip with posterior hip precuations in the past. daughter will be available to assist her at home and ex-husband will be assisting to get patient into her home at discharge    Prior Function Prior Level of Function : Independent/Modified Independent;Driving             Mobility Comments: using cane intermittently       Hand Dominance        Extremity/Trunk Assessment   Upper Extremity Assessment Upper Extremity Assessment: Overall WFL for tasks assessed    Lower Extremity Assessment Lower Extremity Assessment: RLE deficits/detail RLE Deficits / Details: patient is able to complete SLR, activate hip/ knee movement. no knee buckling with weight bearing RLE Sensation: WNL       Communication   Communication: No difficulties  Cognition Arousal/Alertness: Awake/alert Behavior During Therapy: WFL for tasks assessed/performed Overall Cognitive Status: Within Functional Limits for tasks assessed                                          General Comments General comments (skin integrity, edema, etc.): patient educated on posterior hip precuations and written HEP provided    Exercises     Assessment/Plan    PT Assessment Patient needs continued PT services  PT Problem List Decreased strength;Decreased range of motion;Decreased mobility;Decreased safety awareness;Decreased activity tolerance;Pain       PT Treatment Interventions Gait training;DME instruction;Stair training;Functional mobility training;Therapeutic activities;Therapeutic exercise;Balance training;Neuromuscular re-education;Cognitive remediation;Patient/family education    PT Goals (Current goals can be found in the Care Plan section)  Acute Rehab PT Goals Patient Stated Goal: to go home tomorrow PT Goal Formulation: With  patient Time For Goal Achievement: 05/25/23 Potential to Achieve Goals: Good    Frequency BID     Co-evaluation               AM-PAC PT "6 Clicks" Mobility  Outcome Measure Help needed turning from your back to your side while in a flat bed without using bedrails?: A Little Help needed moving from lying on your back to sitting on the side of a flat bed without using bedrails?: A Little Help needed moving to and from a bed to a chair (including a wheelchair)?: A Little Help needed standing up from a chair using your arms (e.g., wheelchair or bedside chair)?: A Little Help needed to walk in hospital room?: A Little Help needed climbing 3-5 steps with a railing? : A Little 6 Click Score: 18    End of Session Equipment Utilized During Treatment: Gait belt Activity Tolerance: Patient tolerated treatment well Patient left: in bed;with call bell/phone within reach;with bed alarm set;with SCD's reapplied;with family/visitor present (ice pack R hip) Nurse Communication: Mobility status PT Visit Diagnosis: Other abnormalities of gait and mobility (R26.89);Difficulty in walking, not elsewhere classified (R26.2)    Time: 1610-9604 PT Time Calculation (min) (ACUTE ONLY): 37 min   Charges:   PT Evaluation $PT Eval Low Complexity: 1 Low PT Treatments $Gait Training: 8-22 mins PT General Charges $$ ACUTE PT VISIT: 1 Visit  Donna Bernard, PT, MPT   Ina Homes 05/11/2023, 4:00 PM

## 2023-05-11 NOTE — H&P (Signed)
History of Present Illness: Candace Juarez is a 72 y.o. who presents today for a history and physical. She is to undergo a right total hip arthroplasty on 05/11/2023. Since her last visit here to clinic there have been no improvement in her condition. The patient expresses her desire to proceed with surgery.  The symptoms have been present for at least 5 years and developed without any specific cause or injury. She has been treated in the past while living in [place] with steroid injections, physical therapy, etc. with limited benefit. She saw Dr. Allena Katz of rheumatology who diagnosed her with degenerative joint disease of the right hip and referred her to me for further evaluation and treatment. The pain is severe, worsening, constant, and 9 on a scale of 1-10. She describes the pain as aching, dull, stabbing, and throbbing. The symptoms are aggravated by standing, walking, stooping or bending, lifting, carrying heavy loads, daily activities, and at night. The symptoms improve with sitting, lying down, and rest. She has been taking Tylenol and an occasional hydrocodone with limited benefit. In addition, she has been applying a heating pad and Voltaren gel, and using a TENS machine, all with limited benefit. She denies any numbness, weakness, paresthesias to either lower extremity.. She reports no bladder, bowel or sexual dysfunction. She has tried several steroid injections and physical therapy in the past as noted above. She ambulates with a cane for balance and support. She is not able to reciprocate stairs and has pain at night.   Past Medical History: Allergy 1980  Anxiety  CHF (congestive heart failure) (CMS/HHS-HCC) 10/23/2021  Chicken pox  Depression  GERD (gastroesophageal reflux disease) 1980  History of stomach ulcers  Hypertension  Lupus (CMS/HHS-HCC)  Measles  Mumps  Osteoporosis 2010  Rheumatoid arthritis (CMS/HHS-HCC)  Rosacea  Thyroid disease   Past Surgical History: COMBINED  AUGMENTATION MAMMAPLASTY AND ABDOMINOPLASTY 1978  right ear cholesteoma 2006  LAPAROSCOPIC INCISIONAL / UMBILICAL / VENTRAL HERNIA REPAIR 2007  COLONOSCOPY 2007  LIMB SPARING RESECTION HIP W/ SADDLE JOINT REPLACEMENT 2008  NASAL ENDOSCOPY 2009  knee surgery 2010 (right knee)  left hip 2nd replacement 2011  BREAST SURGERY 1978, 2004 (breast augmentation)  CATARACT EXTRACTION  CHOLECYSTECTOMY  COLPORRHAPHY FOR REPAIR CYSTOCELE ANTERIOR  CYSTOSCOPY  ENDOVENOUS ABLATION SAPHENOUS VEIN W/ LASER  HERNIA REPAIR 2007  HYSTERECTOMY  JOINT REPLACEMENT 2008/2011  KNEE ARTHROSCOPY 2010  LAPAROSCOPIC SALPINGO-OOPHORECTOMY  LAPAROSCOPIC TUBAL LIGATION  TUBAL LIGATION 1975   Past Family History: Esophageal cancer Mother  Arthritis Mother  Emphysema Father  Heart disease Father  Other Brother  Heart disease Maternal Grandmother  Diabetes Maternal Grandmother  Arthritis Maternal Grandmother  Arthritis Maternal Aunt  Arthritis Maternal Uncle  Arthritis Maternal Grandfather   Medications Current Outpatient Medications  Medication Sig Dispense Refill  abatacept/maltose (ABATACEPT, WITH MALTOSE, IV) Inject 1,000 mg into the vein every 28 (twenty-eight) days  acetaminophen (TYLENOL) 500 MG tablet Take 1,000 mg by mouth every 6 (six) hours as needed  ALPRAZolam (XANAX) 0.5 MG tablet Take 1 tablet (0.5 mg total) by mouth at bedtime as needed for Sleep 30 tablet 1  ammonium lactate (AMLACTIN) 12 % cream Apply at least once daily after bathing and patting dry. 385 g prn  amoxicillin (AMOXIL) 500 MG tablet Take 500 mg by mouth 2 (two) times daily TAKE 4 TABLETS BY MOUTH ONE HOUR PRIOR TO APPT  buPROPion (WELLBUTRIN XL) 300 MG XL tablet Take 1 tablet (300 mg total) by mouth once daily 90 tablet 1  cetirizine (  ZYRTEC) 10 MG tablet TAKE 1 TABLET BY MOUTH EVERY DAY 30 tablet 3  cholecalciferol (VITAMIN D3) 1000 unit tablet Take 1,000 Units by mouth once a week  cycloSPORINE (RESTASIS) 0.05 %  ophthalmic emulsion Place 1 drop into both eyes 2 (two) times daily  denosumab (PROLIA SUBQ) Inject subcutaneously Every 6 months  diclofenac (VOLTAREN) 1 % topical gel Apply 2 g topically as needed  diphenoxylate-atropine (LOMOTIL) 2.5-0.025 mg tablet Take 1 tablet by mouth 2 (two) times daily as needed  docusate (COLACE) 100 MG capsule  ergocalciferol, vitamin D2, 1,250 mcg (50,000 unit) capsule Take 1 capsule (50,000 Units total) by mouth once a week for 12 doses 12 capsule 0  estradioL (ESTRACE) 1 MG tablet Take 1 tablet (1 mg total) by mouth once daily 30 tablet 11  fluticasone propionate (FLONASE) 50 mcg/actuation nasal spray Place 2 sprays into both nostrils once daily as needed  HYDROcodone-acetaminophen (NORCO) 7.5-325 mg tablet Take 1 tablet by mouth every 6 (six) hours as needed for Pain  hydroxychloroquine (PLAQUENIL) 200 mg tablet Take 1 tablet (200 mg total) by mouth 2 (two) times daily 180 tablet 1  levothyroxine (SYNTHROID) 150 MCG tablet TAKE 1 TABLET BY MOUTH EVERY MORNING 30-60 MINS BEFORE BREAKFAST ON AN EMPTY STOMACH WITH A GLASS OF WATER 90 tablet 1  losartan (COZAAR) 50 MG tablet TAKE 1 TABLET(50 MG) BY MOUTH EVERY DAY 90 tablet 3  metoprolol succinate 25 mg CSpX Take 50 mg by mouth once daily  montelukast (SINGULAIR) 10 mg tablet Take 10 mg by mouth at bedtime as needed  nystatin (NYSTOP) 100,000 unit/gram powder Apply 1 Application topically as needed  pantoprazole (PROTONIX) 40 MG DR tablet Take 1 tablet (40 mg total) by mouth 2 (two) times daily before meals 180 tablet 1  pseudoephedrine (SUDAFED 12 HR) 120 mg 12 hr tablet Take 120 mg by mouth every 12 (twelve) hours as needed for Congestion  simethicone (MYLICON) 125 MG chewable tablet Take 125 mg by mouth every 6 (six) hours as needed for Flatulence  TORsemide (DEMADEX) 20 MG tablet Take 1 tablet (20 mg total) by mouth once daily Takes an extra pill PRN 90 tablet 1  triamcinolone 0.1 % ointment Apply 2 times a day to  affected areas. Stop when smooth. 453 g 2  venlafaxine (EFFEXOR-XR) 75 MG XR capsule TAKE 1 CAPSULE(75 MG) BY MOUTH EVERY DAY 90 capsule 1   Allergies: Rifampin Anaphylaxis, Itching and Rash  Sulfa (Sulfonamide Antibiotics) Itching, Rash and Swelling  Sulfur (Not Sulfa) Itching, Swelling and Rash   Review of Systems: A comprehensive 14 point ROS was performed, reviewed, and the pertinent orthopaedic findings are documented in the HPI.  Physical Exam: BP 130/70 (BP Location: Left upper arm, Patient Position: Sitting, BP Cuff Size: Large Adult)  Ht 157.5 cm (5\' 2" )  Wt (!) 107.3 kg (236 lb 9.6 oz)  BMI 43.27 kg/m   General: Well-developed well-nourished female seen in no acute distress.   HEENT: Atraumatic,normocephalic. Pupils are equal and reactive to light. Oropharynx is clear with moist mucosa  Lungs: Clear to auscultation bilaterally   Cardiovascular: Regular rate and rhythm. Normal S1, S2. No murmurs. No appreciable gallops or rubs. Peripheral pulses are palpable.  Abdomen: Soft, non-tender, nondistended. Bowel sounds present  Right Hip exam: She has moderate pain with right hip internal rotation and external rotation. She exhibits flexion to 95 degrees, internal rotation to 10 degrees, and external rotation to 35 degrees on the right. She is neurovascularly intact to both lower  extremities. She has negative sitting straight leg raises  Neurological: The patient is alert and oriented Sensation to light touch appears to be intact and within normal limits Gross motor strength appeared to be equal to 5/5  Vascular: Peripheral pulses felt to be palpable. Capillary refill appears to be intact and within normal limits  X-ray: X-rays taken in [facility] revealed moderate to severe degenerative arthrosis to the right hip joint space with significant narrowing of the articular space and osteophyte formation. There was no evidence of avascular necrosis. No  lytic lesions or fractures were identified.  Impression: 1. Degenerative joint disease, right hip.  Plan:  The treatment options were discussed with the patient and her daughter. In addition, patient educational materials were provided regarding the diagnosis and treatment options. The patient is quite frustrated by her symptoms and functional limitations, and is ready to consider more aggressive treatment options. Therefore, I have recommended a surgical procedure, specifically a right total hip arthroplasty. The procedure was discussed with the patient, as were the potential risks (including bleeding, infection, nerve and/or blood vessel injury, persistent or recurrent pain, loosening and/or failure of the components, dislocation, leg length inequality, need for further surgery, blood clots, strokes, heart attacks and/or arhythmias, pneumonia, etc.) and benefits. The patient states her understanding and wishes to proceed. All of the patient's questions and concerns were answered. She can call any time with further concerns. She will follow up post-surgery, routine.     H&P reviewed and patient re-examined. No changes.

## 2023-05-11 NOTE — Anesthesia Postprocedure Evaluation (Signed)
Anesthesia Post Note  Patient: Candace Juarez  Procedure(s) Performed: TOTAL HIP ARTHROPLASTY (Right: Hip)  Patient location during evaluation: PACU Anesthesia Type: General Level of consciousness: awake and alert Pain management: pain level controlled Vital Signs Assessment: post-procedure vital signs reviewed and stable Respiratory status: spontaneous breathing, nonlabored ventilation and respiratory function stable Cardiovascular status: blood pressure returned to baseline and stable Postop Assessment: no apparent nausea or vomiting Anesthetic complications: no   No notable events documented.   Last Vitals:  Vitals:   05/11/23 1215 05/11/23 1243  BP: (!) 127/49 124/62  Pulse: 79 80  Resp: 13 16  Temp: (!) 36.3 C 36.6 C  SpO2: 99% 99%    Last Pain:  Vitals:   05/11/23 1243  TempSrc: Temporal  PainSc:                  Foye Deer

## 2023-05-11 NOTE — Anesthesia Preprocedure Evaluation (Addendum)
Anesthesia Evaluation  Patient identified by MRN, date of birth, ID band Patient awake    Reviewed: Allergy & Precautions, NPO status , Patient's Chart, lab work & pertinent test results  History of Anesthesia Complications Negative for: history of anesthetic complications  Airway Mallampati: II  TM Distance: >3 FB Neck ROM: full    Dental no notable dental hx.    Pulmonary neg pulmonary ROS   Pulmonary exam normal        Cardiovascular hypertension, On Medications and On Home Beta Blockers + CAD and +CHF  Normal cardiovascular exam  INTERPRETATION  NORMAL LEFT VENTRICULAR SYSTOLIC FUNCTION  NORMAL RIGHT VENTRICULAR SYSTOLIC FUNCTION  NO VALVULAR STENOSIS  MILD VALVULAR REGURGITATION (See above)     Neuro/Psych  Headaches PSYCHIATRIC DISORDERS Anxiety Depression     Neuromuscular disease    GI/Hepatic Neg liver ROS, hiatal hernia,GERD  Controlled,,  Endo/Other  Hypothyroidism  Morbid obesity  Renal/GU Renal disease     Musculoskeletal  (+) Arthritis , Rheumatoid disorders,  Sjogren's  Chronic pain on opiods    Abdominal   Peds  Hematology negative hematology ROS (+)   Anesthesia Other Findings Past Medical History: No date: Anxiety     Comment:  a.) on BZO (alproazolam) PRN No date: Arthritis 10/08/2021: CAD (coronary artery disease)     Comment:  a.) cCTA 10/08/2021: Ca2+ 34.1 (39th percentile for               age/sex/race match control). No FFR analysis performed. 08/27/2021: CHF (congestive heart failure) (HCC)     Comment:  a.) TTE 08/27/2021: EF 35%, glob HK, mild MAC, triv PR,               mild TR, mod MR, G1DD; b.) cCTA 10/08/2021: EF 66%; c,)               TTE 12/09/2021: EF >55%, no RWMAs, mild MR/TR, triv PR,               G1DD No date: Chicken pox No date: Chronic kidney disease No date: Chronic pain syndrome     Comment:  a.) followed by pain management No date: Chronic, continuous use of  opioids     Comment:  a.) followed by pain management No date: Complication of anesthesia     Comment:  a.) early emergence from anesthesia during remote hip               surgery No date: DDD (degenerative disc disease), cervical No date: Depression No date: Gastric ulcer No date: GERD (gastroesophageal reflux disease) No date: History of bilateral cataract extraction No date: History of hiatal hernia No date: HLD (hyperlipidemia) No date: HTN (hypertension) No date: Hypothyroidism No date: Measles No date: Mumps No date: Osteoporosis     Comment:  a.) on denosumab injections No date: Rheumatoid arthritis (HCC)     Comment:  a.) on hydroxychloroquine + abatacept No date: RLS (restless legs syndrome) No date: Rosacea No date: Sjogren's disease (HCC)     Comment:  a.) uses cyclosporine gtts for associated dry eyes No date: SLE (systemic lupus erythematosus) (HCC) No date: Therapeutic opioid-induced constipation (OIC)     Comment:  a.) uses diphenoxylate-atropine PRN  Past Surgical History: 2004: BREAST ENHANCEMENT SURGERY No date: CATARACT EXTRACTION; Bilateral No date: CHOLECYSTECTOMY No date: CHOLESTEATOMA EXCISION; Right     Comment:  x2 No date: COLONOSCOPY 1978: COMBINED AUGMENTATION MAMMAPLASTY AND ABDOMINOPLASTY 10/24/2020: CYSTOCELE REPAIR; N/A     Comment:  Procedure: ANTERIOR REPAIR (CYSTOCELE);  Surgeon:               Christeen Douglas, MD;  Location: [facility] ORS;  Service:               Gynecology;  Laterality: N/A; No date: ENDOVENOUS ABLATION SAPHENOUS VEIN W/ LASER; N/A No date: HIP ARTHROPLASTY; Left No date: KNEE ARTHROPLASTY; Left 2010: KNEE ARTHROSCOPY; Right 2007: LAPAROSCOPIC INCISIONAL / UMBILICAL / VENTRAL HERNIA REPAIR 2008: LIMB SPARING RESECTION HIP W/ SADDLE JOINT REPLACEMENT 2009: NASAL ENDOSCOPY 10/24/2020: TOTAL LAPAROSCOPIC HYSTERECTOMY WITH BILATERAL SALPINGO  OOPHORECTOMY; Bilateral     Comment:  Procedure: TOTAL LAPAROSCOPIC HYSTERECTOMY  WITH               BILATERAL SALPINGO OOPHORECTOMY;  Surgeon: Christeen Douglas, MD;  Location: [facility] OR[facility];  Service: Gynecology;                Laterality: Bilateral; 1975: TUBAL LIGATION; Bilateral No date: UPPER GASTROINTESTINAL ENDOSCOPY  BMI    Body Mass Index: 43.16 kg/m      Reproductive/Obstetrics negative OB ROS                             Anesthesia Physical Anesthesia Plan  ASA: 3  Anesthesia Plan: Spinal   Post-op Pain Management: Regional block*   Induction:   PONV Risk Score and Plan: 2 and Propofol infusion and TIVA  Airway Management Planned: Natural Airway and Nasal Cannula  Additional Equipment:   Intra-op Plan:   Post-operative Plan:   Informed Consent: I have reviewed the patients History and Physical, chart, labs and discussed the procedure including the risks, benefits and alternatives for the proposed anesthesia with the patient or authorized representative who has indicated his/her understanding and acceptance.     Dental Advisory Given  Plan Discussed with: Anesthesiologist, CRNA and Surgeon  Anesthesia Plan Comments: (Patient reports no bleeding problems and no anticoagulant use.  Plan for spinal with backup GA  Patient consented for risks of anesthesia including but not limited to:  - adverse reactions to medications - damage to eyes, teeth, lips or other oral mucosa - nerve damage due to positioning  - risk of bleeding, infection and or nerve damage from spinal that could lead to paralysis - risk of headache or failed spinal - damage to teeth, lips or other oral mucosa - sore throat or hoarseness - damage to heart, brain, nerves, lungs, other parts of body or loss of life  Patient voiced understanding.)       Anesthesia Quick Evaluation

## 2023-05-11 NOTE — Op Note (Signed)
05/11/2023  10:38 AM  Patient:   Candace Juarez  Pre-Op Diagnosis:   Degenerative joint disease, right hip.  Post-Op Diagnosis:   Same.  Procedure:   Right total hip arthroplasty.  Surgeon:   Maryagnes Amos, MD  Assistant:   Horris Latino, PA-C  Anesthesia:   Attempted spinal  --> GET  Findings:   As above.  Complications:   None  EBL:   150 cc  Fluids:   400 cc crystalloid  UOP:   None  TT:   None  Drains:   None  Closure:   Staples  Implants:   Biomet press-fit system with a #10 laterally offset Echo femoral stem, a 48 mm acetabular shell with an E-poly hi-wall liner, and a 32 mm ceramic head with a -3 mm neck.  Brief Clinical Note:   The patient is a 72 year old female with a history of gradually worsening right hip/groin pain. Her symptoms have progressed despite medications, activity modification, etc. Her history and examination consistent with advanced degenerative joint disease, confirmed by plain radiographs. The patient presents at this time for a right total hip arthroplasty.   Procedure:   The patient was brought into the operating room. After an attempt at spinal anesthesia was unsuccessful, the patient underwent successful general endotracheal intubation and anesthesia. The patient was repositioned in the left lateral decubitus position and secured using a lateral hip positioner. The right hip and lower extremity were prepped with ChloroPrep solution before being draped sterilely. Preoperative antibiotics were administered. A timeout was performed to verify the appropriate surgical site.    A standard posterior approach to the hip was made through an approximately 10-12 inch incision. The incision was carried down through the subcutaneous tissues to expose the gluteal fascia and proximal end of the iliotibial band. These structures were split the length of the incision and the Charnley self-retaining hip retractor placed. The bursal tissues were swept posteriorly to  expose the short external rotators. The anterior border of the piriformis tendon was identified and this plane developed down through the capsule to enter the joint. A flap of tissue was elevated off the posterior aspect of the femoral neck and greater trochanter and retracted posteriorly. This flap included the piriformis tendon, the short external rotators, and the posterior capsule. The soft tissues were elevated off the lateral aspect of the ilium and a large Steinmann pin placed bicortically.   With the right leg aligned over the left, a drill bit was placed into the greater trochanter parallel to the Steinmann pin and the distance between these two pins measured in order to optimize leg lengths postoperatively. The drill bit was removed and the hip dislocated. The piriformis fossa was debrided of soft tissues before the intramedullary canal was accessed through this point using a triple step reamer. The canal was reamed sequentially beginning with a #7 tapered reamer and progressing to a #10 tapered reamer. This provided excellent circumferential chatter. Using the appropriate guide, a femoral neck cut was made 10-12 mm above the lesser trochanter. The femoral head was removed.  Attention was directed to the acetabular side. The labrum was debrided circumferentially before the ligamentum teres was removed using a large curette. A line was drawn on the drapes corresponding to the native version of the acetabulum. This line was used as a guide while the acetabulum was reamed sequentially beginning with a 41 mm reamer and progressing to a 47 mm reamer. This provided excellent circumferential chatter. The 47 mm trial acetabulum  was positioned and found to fit quite well. Therefore, the 48 mm acetabular shell was selected and impacted into place with care taken to maintain the appropriate version. The trial high wall liner was inserted.  Attention was redirected to the femoral side. A box osteotome was used  to establish version before the canal was broached sequentially beginning with a #7 broach and progressing to a #10 broach. This was left in place and several trial reductions performed using both the standard and laterally offset neck options, as well as the -6 mm, -3 mm, and +0 mm neck lengths. After removing the trial components, the "manhole cover" was placed into the apex of the acetabular shell and tightened securely. The permanent E-polyethylene hi-wall liner was impacted into the acetabular shell and its locking mechanism verified using a quarter-inch osteotome. Next, the #10 laterally offset femoral stem was impacted into place with care taken to maintain the appropriate version. A repeat trial reduction was performed using the -3 mm and +0 mm neck lengths. The -3 mm neck length demonstrated excellent stability both in extension and external rotation as well as with flexion to 90 and internal rotation beyond 70. It also was stable in the position of sleep. In addition, leg lengths appeared to be restored appropriately, both by reassessing the position of the right leg over the left, as well as by measuring the distance between the Steinmann pin and the drill bit. The 32 mm ceramic head with the -3 mm neck was impacted onto the stem of the femoral component. The Morse taper locking mechanism was verified using manual distraction before the head was relocated and placed through a range of motion with the findings as described above.  The wound was copiously irrigated with sterile saline solution via the jet lavage system before the peri-incisional and pericapsular tissues were injected with a "cocktail" of 20 cc of Exparel, 30 cc of 0.5% Sensorcaine, 2 cc of Kenalog 40 (80 mg), and 30 mg of Toradol diluted out to 90 cc with normal saline to help with postoperative analgesia. The posterior flap was reapproximated to the posterior aspect of the greater trochanter using #2 Tycron interrupted sutures placed  through drill holes. Several additional #2 Tycron interrupted sutures were used to reinforce this layer of closure. The iliotibial band was reapproximated using #1 Vicryl interrupted sutures before the gluteal fascia was closed using a running #1 Vicryl suture. At this point, 1 g of transexemic acid in 10 cc of normal saline was injected into the joint to help reduce postoperative bleeding. The subcutaneous tissues were closed in several layers using 2-0 Vicryl interrupted sutures before the skin was closed using staples. A sterile occlusive dressing was applied to the wound. The patient was then rolled back into the supine position on her hospital bed before being awakened, extubated, and returned to the recovery room in satisfactory condition after tolerating the procedure well.

## 2023-05-11 NOTE — Transfer of Care (Signed)
Immediate Anesthesia Transfer of Care Note  Patient: Candace Juarez  Procedure(s) Performed: TOTAL HIP ARTHROPLASTY (Right: Hip)  Patient Location: PACU  Anesthesia Type:General  Level of Consciousness: drowsy and patient cooperative  Airway & Oxygen Therapy: Patient Spontanous Breathing and Patient connected to face mask oxygen  Post-op Assessment: Report given to RN and Post -op Vital signs reviewed and stable  Post vital signs: Reviewed and stable  Last Vitals:  Vitals Value Taken Time  BP 124/42 05/11/23 1056  Temp    Pulse 74 05/11/23 1059  Resp 18 05/11/23 1059  SpO2 100 % 05/11/23 1059  Vitals shown include unvalidated device data.  Last Pain:  Vitals:   05/11/23 0639  TempSrc: Temporal  PainSc: 8          Complications: No notable events documented.

## 2023-05-11 NOTE — TOC Progression Note (Signed)
Transition of Care [facility]) - Progression Note    Patient Details  Name: Mursal Sell MRN: [id number] Date of Birth: Nov 30, 1950  [facility]) CM/SW Contact  Marlowe Sax, RN Phone Number: 05/11/2023, 4:05 PM  Clinical Narrative:     The patient is set up with [facility] for [facility] services prior to surgery by surgeons office Has Rolling Walker (2 wheels);Hand held shower head;Shower seat;BSC/3in1;Cane - single point;Other (comment) (uses BSC over the toilet; adjustable bed)       Expected Discharge Plan and Services                                               Social Determinants of Health (SDOH) Interventions SDOH Screenings   Food Insecurity: No Food Insecurity (05/11/2023)  Housing: Low Risk  (05/11/2023)  Transportation Needs: No Transportation Needs (05/11/2023)  Utilities: Not At Risk (05/11/2023)  Depression (PHQ2-9): Low Risk  (10/05/2022)  Tobacco Use: Low Risk  (05/11/2023)    Readmission Risk Interventions     No data to display

## 2023-05-12 DIAGNOSIS — M1611 Unilateral primary osteoarthritis, right hip: Secondary | ICD-10-CM | POA: Diagnosis not present

## 2023-05-12 LAB — CBC
HCT: 28.6 % — ABNORMAL LOW (ref 36.0–46.0)
Hemoglobin: 9.3 g/dL — ABNORMAL LOW (ref 12.0–15.0)
MCH: 30.1 pg (ref 26.0–34.0)
MCHC: 32.5 g/dL (ref 30.0–36.0)
MCV: 92.6 fL (ref 80.0–100.0)
Platelets: 227 10*3/uL (ref 150–400)
RBC: 3.09 MIL/uL — ABNORMAL LOW (ref 3.87–5.11)
RDW: 14.3 % (ref 11.5–15.5)
WBC: 8.3 10*3/uL (ref 4.0–10.5)
nRBC: 0 % (ref 0.0–0.2)

## 2023-05-12 LAB — BASIC METABOLIC PANEL
Anion gap: 11 (ref 5–15)
BUN: 30 mg/dL — ABNORMAL HIGH (ref 8–23)
CO2: 24 mmol/L (ref 22–32)
Calcium: 7.4 mg/dL — ABNORMAL LOW (ref 8.9–10.3)
Chloride: 97 mmol/L — ABNORMAL LOW (ref 98–111)
Creatinine, Ser: 1.2 mg/dL — ABNORMAL HIGH (ref 0.44–1.00)
GFR, Estimated: 48 mL/min — ABNORMAL LOW (ref >60)
Glucose, Bld: 145 mg/dL — ABNORMAL HIGH (ref 70–99)
Potassium: 4.5 mmol/L (ref 3.5–5.1)
Sodium: 132 mmol/L — ABNORMAL LOW (ref 135–145)

## 2023-05-12 MED ORDER — KETOROLAC TROMETHAMINE 15 MG/ML IJ SOLN
INTRAMUSCULAR | Status: AC
  Filled 2023-05-12: qty 1

## 2023-05-12 MED ORDER — APIXABAN 2.5 MG PO TABS
ORAL_TABLET | ORAL | Status: AC
  Filled 2023-05-12: qty 1

## 2023-05-12 MED ORDER — HYDROCODONE-ACETAMINOPHEN 7.5-325 MG PO TABS
ORAL_TABLET | ORAL | Status: AC
  Filled 2023-05-12: qty 2

## 2023-05-12 MED ORDER — ONDANSETRON HCL 4 MG PO TABS: 4.0000 mg | ORAL_TABLET | Freq: Four times a day (QID) | ORAL | 0 refills | Status: AC | PRN

## 2023-05-12 MED ORDER — PANTOPRAZOLE SODIUM 40 MG PO TBEC
DELAYED_RELEASE_TABLET | ORAL | Status: AC
  Filled 2023-05-12: qty 1

## 2023-05-12 MED ORDER — TRAMADOL HCL 50 MG PO TABS: 50.0000 mg | ORAL_TABLET | ORAL | 0 refills | Status: DC | PRN

## 2023-05-12 MED ORDER — DOCUSATE SODIUM 100 MG PO CAPS
ORAL_CAPSULE | ORAL | Status: AC
  Filled 2023-05-12: qty 1

## 2023-05-12 MED ORDER — CEFAZOLIN SODIUM-DEXTROSE 2-4 GM/100ML-% IV SOLN
INTRAVENOUS | Status: AC
  Filled 2023-05-12: qty 100

## 2023-05-12 MED ORDER — APIXABAN 2.5 MG PO TABS: 2.5000 mg | ORAL_TABLET | Freq: Two times a day (BID) | ORAL | 0 refills | Status: DC

## 2023-05-12 MED ORDER — LOSARTAN POTASSIUM 50 MG PO TABS
ORAL_TABLET | ORAL | Status: AC
  Filled 2023-05-12: qty 1

## 2023-05-12 NOTE — Plan of Care (Signed)
  Problem: Activity: Goal: Ability to avoid complications of mobility impairment will improve Outcome: Progressing   Problem: Pain Management: Goal: Pain level will decrease with appropriate interventions Outcome: Progressing   Problem: Skin Integrity: Goal: Will show signs of wound healing Outcome: Progressing   

## 2023-05-12 NOTE — Discharge Instructions (Signed)
Instructions after Total Hip Replacement     J. Derald Macleod, M.D.  Valeria Batman, PA-C     Dept. of Orthopaedics & Sports Medicine  [facility]  [address]  [place], [place]  [address]  Phone: [phone]   Fax: [phone]    DIET: Drink plenty of non-alcoholic fluids. Resume your normal diet. Include foods high in fiber.  ACTIVITY:  You may use crutches or a walker with weight-bearing as tolerated, unless instructed otherwise. You may be weaned off of the walker or crutches by your Physical Therapist.  Do NOT reach below the level of your knees or cross your legs until allowed.    Continue doing gentle exercises. Exercising will reduce the pain and swelling, increase motion, and prevent muscle weakness.   Please continue to use the TED compression stockings for 4 weeks. You may remove the stockings at night, but should reapply them in the morning. Do not drive or operate any equipment until instructed.  WOUND CARE:  Continue to use ice packs periodically to reduce pain and swelling. Keep the incision clean and dry. You may bathe or shower after the staples are removed at the first office visit following surgery.  MEDICATIONS: You may resume your regular medications. Please take the pain medication as prescribed on the medication. Continue with Chronic Norco as previously prescribed.  Tramadol as needed for breakthrough pain.  If pain is not controlled by this regimen contact the office and we can adjust you pain medicine. Do not take pain medication on an empty stomach. You have been given a prescription for a blood thinner to prevent blood clots. Please take the medication as instructed. (NOTE: After completing a 2 week course of Eliquis, take one Enteric-coated aspirin once a day.) Pain medications and iron supplements can cause constipation. Use a stool softener (Senokot or Colace) on a daily basis and a laxative (dulcolax or miralax) as needed. Do not drive  or drink alcoholic beverages when taking pain medications.  CALL THE OFFICE FOR: Temperature above 101 degrees Excessive bleeding or drainage on the dressing. Excessive swelling, coldness, or paleness of the toes. Persistent nausea and vomiting.  FOLLOW-UP:  You should have an appointment to return to the office in 2 weeks after surgery. Arrangements have been made for continuation of Physical Therapy (either home therapy or outpatient therapy).

## 2023-05-12 NOTE — Discharge Summary (Signed)
Physician Discharge Summary  Patient ID: Candace Juarez MRN: [id number] DOB/AGE: 01/20/1951 72 y.o.  Admit date: 05/11/2023 Discharge date: 05/12/2023  Admission Diagnoses:  Status post total hip replacement, right [Z96.641] Right hip degenerative joint disease  Discharge Diagnoses: Patient Active Problem List   Diagnosis Date Noted   Status post total hip replacement, right 05/11/2023   Primary osteoarthritis of right hip 12/16/2022   Cervicalgia 11/11/2022   Cervical radicular pain 09/22/2022   Cervico-occipital neuralgia 09/22/2022   Primary osteoarthritis of both shoulders 03/25/2021   Chronic pain of both shoulders 03/25/2021   Pain management contract signed 12/04/2020   Rheumatoid arthritis involving multiple sites (HCC) 11/21/2020   Polyarthralgia 11/21/2020   History of left hip replacement 11/21/2020   H/O right knee surgery 11/21/2020   Chronic pain of both knees 11/21/2020   Chronic right hip pain 11/21/2020   Chronic pain syndrome 11/21/2020   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 No FFR analysis performed.   CHF (congestive heart failure) (HCC) 08/27/2021   a.) TTE 08/27/2021: EF 35%, glob HK, mild MAC, triv PR, mild TR, mod MR, G1DD; b.) cCTA 10/08/2021: EF 66%; c,) TTE 12/09/2021: EF >55%, no RWMAs, mild MR/TR, triv PR, G1DD   Chicken pox    Chronic kidney disease    Chronic pain syndrome    a.) followed by pain management   Chronic, continuous use of opioids    a.) followed by pain management   Complication of anesthesia    a.) early emergence from anesthesia during remote hip surgery   DDD (degenerative disc disease), cervical    Depression    Gastric ulcer     GERD (gastroesophageal reflux disease)    History of bilateral cataract extraction    History of hiatal hernia    HLD (hyperlipidemia)    HTN (hypertension)    Hypothyroidism    Measles    Mumps    Osteoporosis    a.) on denosumab injections   Rheumatoid arthritis (HCC)    a.) on hydroxychloroquine + abatacept   RLS (restless legs syndrome)    Rosacea    Sjogren's disease (HCC)    a.) uses cyclosporine gtts for associated dry eyes   SLE (systemic lupus erythematosus) (HCC)    Therapeutic opioid-induced constipation (OIC)    a.) uses diphenoxylate-atropine PRN     Transfusion: None.   Consultants (if any):   Discharged Condition: Improved  Hospital Course: Candace Juarez is an 72 y.o. female who was admitted 05/11/2023 with a diagnosis of right hip degenerative joint disease and went to the operating room on 05/11/2023 and underwent the above named procedures.    Surgeries: Procedure(s): TOTAL HIP ARTHROPLASTY on 05/11/2023 Patient tolerated the surgery well. Taken to PACU where she was stabilized and then transferred to the orthopedic floor.  Started on Eliquis 2.5mg  ever 12 hrs. Heels elevated on bed with rolled towels. No evidence of DVT. Negative Homan.  TED Hose applied. Physical therapy started on day #1 for gait training and transfer. OT started day #1 for ADL and assisted devices.  Patient's IV was removed on POD1.  Patient was discharged with Prevena woundvac in place.    Implants: Biomet press-fit system with  a #10 laterally offset Echo femoral stem, a 48 mm acetabular shell with an E-poly hi-wall liner, and a 32 mm ceramic head with a -3 mm neck.   She was given perioperative antibiotics:  Anti-infectives (From admission, onward)    Start     Dose/Rate Route Frequency Ordered Stop   05/11/23 1400  ceFAZolin (ANCEF) IVPB 2g/100 mL premix        2 g 200 mL/hr over 30 Minutes Intravenous Every 6 hours 05/11/23 1223 05/12/23 0352   05/11/23 0600  ceFAZolin (ANCEF)  IVPB 2g/100 mL premix        2 g 200 mL/hr over 30 Minutes Intravenous On call to O.R. 05/10/23 2236 05/11/23 8119     .  She was given sequential compression devices, early ambulation, and Eliquis for DVT prophylaxis.  She benefited maximally from the hospital stay and there were no complications.    Recent vital signs:  Vitals:   05/12/23 0015 05/12/23 0500  BP: (!) 141/65 (!) 140/78  Pulse: (!) 113 93  Resp: 18 16  Temp: (!) 97.5 F (36.4 C) 98.7 F (37.1 C)  SpO2: 98% 99%    Recent laboratory studies:  Lab Results  Component Value Date   HGB 9.3 (L) 05/12/2023   HGB 12.0 04/30/2023   HGB 13.1 12/06/2020   Lab Results  Component Value Date   WBC 8.3 05/12/2023   PLT 227 05/12/2023   Lab Results  Component Value Date   INR 1.0 09/06/2020   Lab Results  Component Value Date   NA 132 (L) 05/12/2023   K 4.5 05/12/2023   CL 97 (L) 05/12/2023   CO2 24 05/12/2023   BUN 30 (H) 05/12/2023   CREATININE 1.20 (H) 05/12/2023   GLUCOSE 145 (H) 05/12/2023    Discharge Medications:   Allergies as of 05/12/2023       Reactions   Rifampin Itching, Rash, Anaphylaxis   Sulfa Antibiotics Itching, Swelling, Rash   Cephalexin Hives   Reddened areas on face and other parts of her body    Nsaids    Avoid due to stomach ulcers    Sulfur Itching, Rash, Swelling        Medication List     STOP taking these medications    nystatin powder Generic drug: nystatin   triamcinolone cream 0.1 % Commonly known as: KENALOG       TAKE these medications    acetaminophen 500 MG tablet Commonly known as: TYLENOL Take 1,000 mg by mouth every 6 (six) hours as needed.   ALPRAZolam 0.5 MG tablet Commonly known as: XANAX Take 0.5 mg by mouth 3 (three) times daily as needed for anxiety.   apixaban 2.5 MG Tabs tablet Commonly known as: ELIQUIS Take 1 tablet (2.5 mg total) by mouth 2 (two) times daily.   buPROPion 300 MG 24 hr tablet Commonly known as: WELLBUTRIN XL Take  300 mg by mouth daily.   cetirizine 10 MG tablet Commonly known as: ZYRTEC Take 10 mg by mouth daily as needed for allergies.   cholecalciferol 25 MCG (1000 UNIT) tablet Commonly known as: VITAMIN D3 Take 1,000 Units by mouth once a week.   cycloSPORINE 0.05 % ophthalmic emulsion Commonly known as: RESTASIS Place 1 drop into both eyes 2 (two) times daily.   denosumab 60 MG/ML Sosy injection Commonly known as: PROLIA Inject 60 mg into the skin every 6 (six) months.   diclofenac Sodium 1 % Gel Commonly known as: VOLTAREN Apply 2 g  topically as needed.   diphenoxylate-atropine 2.5-0.025 MG tablet Commonly known as: LOMOTIL Take 1 tablet by mouth daily as needed for diarrhea or loose stools.   estradiol 2 MG tablet Commonly known as: ESTRACE Take 1 mg by mouth daily.   fluticasone 50 MCG/ACT nasal spray Commonly known as: FLONASE Place 1 spray into both nostrils daily as needed for allergies or rhinitis.   HYDROcodone-acetaminophen 7.5-325 MG tablet Commonly known as: Norco Take 1 tablet by mouth 3 (three) times daily as needed for severe pain. Must last 30 days   HYDROcodone-acetaminophen 7.5-325 MG tablet Commonly known as: Norco Take 1 tablet by mouth 3 (three) times daily as needed for severe pain. Must last 30 days Start taking on: May 26, 2023   HYDROcodone-acetaminophen 7.5-325 MG tablet Commonly known as: Norco Take 1 tablet by mouth 3 (three) times daily as needed for severe pain. Must last 30 days Start taking on: June 25, 2023   hydroxychloroquine 200 MG tablet Commonly known as: PLAQUENIL Take 200 mg by mouth 2 (two) times daily.   levothyroxine 125 MCG tablet Commonly known as: SYNTHROID Take 125 mcg by mouth daily before breakfast.   losartan 50 MG tablet Commonly known as: COZAAR Take 50 mg by mouth daily.   Metoprolol Succinate 25 MG Cs24 Take 50 mg by mouth daily.   metroNIDAZOLE 0.75 % cream Commonly known as: METROCREAM Apply  topically as needed.   mupirocin 2% oint-hydrocortisone 2.5% cream-nystatin cream-zinc oxide 13% oint 1:1:1:5 mixture Apply topically 2 (two) times daily as needed.   ondansetron 4 MG tablet Commonly known as: ZOFRAN Take 1 tablet (4 mg total) by mouth every 6 (six) hours as needed for nausea.   ORENCIA IV Inject 1,000 mg into the vein every 30 (thirty) days.   pantoprazole 40 MG tablet Commonly known as: PROTONIX Take 40 mg by mouth daily.   pseudoephedrine 120 MG 12 hr tablet Commonly known as: SUDAFED Take 120 mg by mouth every 12 (twelve) hours as needed for congestion.   simethicone 125 MG chewable tablet Commonly known as: MYLICON Chew 125 mg by mouth every 6 (six) hours as needed for flatulence.   SYSTANE ULTRA OP Place 1 drop into both eyes in the morning, at noon, in the evening, and at bedtime.   torsemide 20 MG tablet Commonly known as: DEMADEX Take 20 mg by mouth daily.   traMADol 50 MG tablet Commonly known as: Ultram Take 1-2 tablets (50-100 mg total) by mouth every 4 (four) hours as needed (breakthrough pain.).   venlafaxine XR 75 MG 24 hr capsule Commonly known as: EFFEXOR-XR Take 75 mg by mouth daily.               Durable Medical Equipment  (From admission, onward)           Start     Ordered   05/11/23 1224  DME Bedside commode  Once       Question:  Patient needs a bedside commode to treat with the following condition  Answer:  Status post total hip replacement, right   05/11/23 1223   05/11/23 1224  DME 3 n 1  Once        05/11/23 1223   05/11/23 1224  DME Walker rolling  Once       Question Answer Comment  Walker: With 5 Inch Wheels   Patient needs a walker to treat with the following condition Status post total hip replacement, right      05/11/23  1223            Diagnostic Studies: DG HIP UNILAT W OR W/O PELVIS 2-3 VIEWS RIGHT  Result Date: 05/11/2023 CLINICAL DATA:  Status post total right hip arthroplasty. EXAM: DG  HIP (WITH OR WITHOUT PELVIS) 2-3V RIGHT COMPARISON:  Right hip radiographs 11/13/2022 FINDINGS: Interval total right hip arthroplasty. Redemonstration of prior total left hip arthroplasty. No perihardware lucency is seen to indicate hardware failure or loosening. Expected postoperative changes including lateral right hip subcutaneous air and lateral surgical skin staples. Mild subchondral sclerosis within the visualized inferior sacroiliac joints. No acute fracture or dislocation. IMPRESSION: Status post total right hip arthroplasty without evidence of hardware failure. Electronically Signed   By: Neita Garnet M.D.   On: 05/11/2023 13:55    Disposition: Plan for discharge home with HHPT today pending progress with PT.  Discharge home with Prevena woundvac, will change to dry dressing in 7-10 days when [facility] battery stops functioning.   Follow-up Information     Anson [place], PA-C Follow up.   Specialty: Physician Assistant Why: Mindi Slicker information: [address] [place] [address] [phone]                Signed: Meriel Pica PA-C 05/12/2023, 7:54 AM

## 2023-05-12 NOTE — Progress Notes (Signed)
Subjective: 1 Day Post-Op Procedure(s) (LRB): TOTAL HIP ARTHROPLASTY (Right) Patient reports pain as mild.   Patient is well, and has had no acute complaints or problems Plan is to go Home after hospital stay. Negative for chest pain and shortness of breath Fever: no Gastrointestinal:Negative for nausea and vomiting She is urinating well this morning, reports she is passing gas without pain.  Objective: Vital signs in last 24 hours: Temp:  [97.4 F (36.3 C)-98.9 F (37.2 C)] 98.7 F (37.1 C) (07/03 0500) Pulse Rate:  [74-113] 93 (07/03 0500) Resp:  [12-22] 16 (07/03 0500) BP: (112-141)/(42-83) 140/78 (07/03 0500) SpO2:  [96 %-100 %] 99 % (07/03 0500)  Intake/Output from previous day:  Intake/Output Summary (Last 24 hours) at 05/12/2023 0746 Last data filed at 05/12/2023 0400 Gross per 24 hour  Intake 2119.62 ml  Output 650 ml  Net 1469.62 ml    Intake/Output this shift: No intake/output data recorded.  Labs: Recent Labs    05/12/23 0457  HGB 9.3*   Recent Labs    05/12/23 0457  WBC 8.3  RBC 3.09*  HCT 28.6*  PLT 227   Recent Labs    05/12/23 0457  NA 132*  K 4.5  CL 97*  CO2 24  BUN 30*  CREATININE 1.20*  GLUCOSE 145*  CALCIUM 7.4*   No results for input(s): "LABPT", "INR" in the last 72 hours.   EXAM General - Patient is Alert, Appropriate, and Oriented Extremity - ABD soft Neurovascular intact Dorsiflexion/Plantar flexion intact Incision: dressing C/D/I No cellulitis present Compartment soft Dressing/Incision - Prevena intact without drainage. Motor Function - intact, moving foot and toes well on exam.  Abdomen soft with intact bowel sounds this morning.  Past Medical History:  Diagnosis Date   Anxiety    a.) on BZO (alproazolam) PRN   Arthritis    CAD (coronary artery disease) 10/08/2021   a.) cCTA 10/08/2021: Ca2+ 34.1 (39th percentile for age/sex/race match control). No FFR analysis performed.   CHF (congestive heart failure) (HCC)  08/27/2021   a.) TTE 08/27/2021: EF 35%, glob HK, mild MAC, triv PR, mild TR, mod MR, G1DD; b.) cCTA 10/08/2021: EF 66%; c,) TTE 12/09/2021: EF >55%, no RWMAs, mild MR/TR, triv PR, G1DD   Chicken pox    Chronic kidney disease    Chronic pain syndrome    a.) followed by pain management   Chronic, continuous use of opioids    a.) followed by pain management   Complication of anesthesia    a.) early emergence from anesthesia during remote hip surgery   DDD (degenerative disc disease), cervical    Depression    Gastric ulcer    GERD (gastroesophageal reflux disease)    History of bilateral cataract extraction    History of hiatal hernia    HLD (hyperlipidemia)    HTN (hypertension)    Hypothyroidism    Measles    Mumps    Osteoporosis    a.) on denosumab injections   Rheumatoid arthritis (HCC)    a.) on hydroxychloroquine + abatacept   RLS (restless legs syndrome)    Rosacea    Sjogren's disease (HCC)    a.) uses cyclosporine gtts for associated dry eyes   SLE (systemic lupus erythematosus) (HCC)    Therapeutic opioid-induced constipation (OIC)    a.) uses diphenoxylate-atropine PRN    Assessment/Plan: 1 Day Post-Op Procedure(s) (LRB): TOTAL HIP ARTHROPLASTY (Right) Principal Problem:   Status post total hip replacement, right  Estimated body mass index is  43.16 kg/m as calculated from the following:   Height as of this encounter: 5\' 2"  (1.575 m).   Weight as of this encounter: 107 kg. Advance diet Up with therapy D/C IV fluids when tolerating po intake.  Labs reviewed this AM, WBC 8.3, Hg 9.3. Cr 1.2, BUN 30, improved when compared to labs from several days ago. Patient is passing gas without pain.  Up with therapy today. Plan for discharge home with HHPT, discharge home with Prevena woundvac.  DVT Prophylaxis - TED hose and Eliquis Weight-Bearing as tolerated to right leg  J. Horris Latino, PA-C [facility] 05/12/2023, 7:46 AM

## 2023-05-12 NOTE — Progress Notes (Signed)
Physical Therapy Treatment Patient Details Name: Candace Juarez MRN: [id number] DOB: 04-Sep-1951 Today's Date: 05/12/2023   History of Present Illness Patient is a 72 year old female with degenerative joint disease right hip, s/p right total hip arthroplasty.    PT Comments  Pt was supine in bed with HOB elevate ~ 20 degrees. He agrees to PT session and remains motivated and pleasant throughout. Easily and safely able to exit bed, stand, and ambulate with RW without physical assistance. Pt safely perform ascending/descending stairs with +1 rail to simulate home entry. Pt is cleared form an acute PT standpoint for safe DC home with HHPT to follow. Pt will continue to benefit from continued skilled PT at DC to maximize independence and safety with all ADLs.     Assistance Recommended at Discharge PRN  If plan is discharge home, recommend the following:  Can travel by private vehicle    A little help with walking and/or transfers;A little help with bathing/dressing/bathroom;Assistance with cooking/housework;Assist for transportation;Help with stairs or ramp for entrance      Equipment Recommendations  BSC/3in1       Precautions / Restrictions Precautions Precautions: Posterior Hip;Fall Precaution Booklet Issued: Yes (comment) Restrictions Weight Bearing Restrictions: Yes RLE Weight Bearing: Weight bearing as tolerated     Mobility  Bed Mobility Overal bed mobility: Needs Assistance Bed Mobility: Supine to Sit, Sit to Supine  Supine to sit: Supervision   Transfers Overall transfer level: Needs assistance Equipment used: Rolling walker (2 wheels) Transfers: Sit to/from Stand Sit to Stand: Supervision, From elevated surface (pt has elevating bed)   Ambulation/Gait Ambulation/Gait assistance: Supervision Gait Distance (Feet): 200 Feet Assistive device: Rolling walker (2 wheels) Gait Pattern/deviations: Step-through pattern Gait velocity: decreased  General Gait Details: no LOB  or safety concerns with use of RW to ambulate > 200 ft   Stairs Stairs: Yes Stairs assistance: Supervision Stair Management: One rail Right, Sideways Number of Stairs: 4 General stair comments: no difficulty or safety concerns with stair performance    Balance Overall balance assessment: Needs assistance Sitting-balance support: Feet supported Sitting balance-Leahy Scale: Good     Standing balance support: Bilateral upper extremity supported, Reliant on assistive device for balance, During functional activity Standing balance-Leahy Scale: Good Standing balance comment: no LOB with dynamic task with UE support       Cognition Arousal/Alertness: Awake/alert Behavior During Therapy: WFL for tasks assessed/performed Overall Cognitive Status: Within Functional Limits for tasks assessed    General Comments: pt is A and O x 4               Pertinent Vitals/Pain Pain Assessment Pain Assessment: 0-10 Pain Score: 8  Pain Location: R hip Pain Descriptors / Indicators: Discomfort Pain Intervention(s): Limited activity within patient's tolerance, Monitored during session, Premedicated before session, Repositioned     PT Goals (current goals can now be found in the care plan section) Acute Rehab PT Goals Patient Stated Goal: go home Progress towards PT goals: Progressing toward goals    Frequency    BID      PT Plan Current plan remains appropriate       AM-PAC PT "6 Clicks" Mobility   Outcome Measure  Help needed turning from your back to your side while in a flat bed without using bedrails?: A Little Help needed moving from lying on your back to sitting on the side of a flat bed without using bedrails?: A Little Help needed moving to and from a bed to a chair (  including a wheelchair)?: A Little Help needed standing up from a chair using your arms (e.g., wheelchair or bedside chair)?: A Little Help needed to walk in hospital room?: A Little Help needed climbing  3-5 steps with a railing? : A Little 6 Click Score: 18    End of Session Equipment Utilized During Treatment: Gait belt Activity Tolerance: Patient tolerated treatment well Patient left: in chair;with call bell/phone within reach;with chair alarm set Nurse Communication: Mobility status PT Visit Diagnosis: Other abnormalities of gait and mobility (R26.89);Difficulty in walking, not elsewhere classified (R26.2)     Time: 1610-9604 PT Time Calculation (min) (ACUTE ONLY): 27 min  Charges:    $Gait Training: 8-22 mins $Therapeutic Exercise: 8-22 mins PT General Charges $$ ACUTE PT VISIT: 1 Visit                     Jetta Lout PTA 05/12/23, 8:27 AM

## 2023-05-12 NOTE — Progress Notes (Signed)
OT Cancellation Note  Patient Details Name: Candace Juarez MRN: [id number] DOB: Jun 22, 1951   Cancelled Treatment:    Reason Eval/Treat Not Completed: OT screened, no needs identified, will sign off. Pt reports having finished PT this morning, moving well. Pt has all needed DME (and BSC is being ordered, as she states she used to have one, but no longer does); has adaptive strategies for ADLs already in place due to RA and lupus. Daughter to assist with ADL/IADL as needed. Pt able to verbalize hip precautions; stating she has done this surgery twice on her other hip without difficulty maintaining precautions. Voices no concerns about d/c home; declines dressing at this time; RN in room to give meds. No OT needs; will sign off.   Alvester Morin 05/12/2023, 9:19 AM

## 2023-05-12 NOTE — Progress Notes (Signed)
DISCHARGE NOTE:  Pt given discharge instructions and verbalized understanding. TED hose on both legs. Portable Prevena wound vac connected, extra honeycomb dressing sent. BSC delivered to room and sent with pt. Pt wheeled to car by staff, daughter providing transportation.

## 2023-05-12 NOTE — Progress Notes (Signed)
Patient is not able to walk the distance required to go the bathroom, or he/she is unable to safely negotiate stairs required to access the bathroom.  A 3in1 BSC will alleviate this problem  

## 2023-06-04 ENCOUNTER — Encounter: Payer: Self-pay | Admitting: Surgery

## 2023-07-15 ENCOUNTER — Encounter: Payer: Medicare Other | Admitting: Student in an Organized Health Care Education/Training Program

## 2023-07-22 ENCOUNTER — Ambulatory Visit
Payer: Medicare Other | Attending: Student in an Organized Health Care Education/Training Program | Admitting: Student in an Organized Health Care Education/Training Program

## 2023-07-22 ENCOUNTER — Encounter: Payer: Self-pay | Admitting: Student in an Organized Health Care Education/Training Program

## 2023-07-22 VITALS — BP 130/52 | HR 70 | Temp 98.4°F | Resp 14 | Ht 62.0 in | Wt 232.0 lb

## 2023-07-22 DIAGNOSIS — M25551 Pain in right hip: Secondary | ICD-10-CM | POA: Insufficient documentation

## 2023-07-22 DIAGNOSIS — M25512 Pain in left shoulder: Secondary | ICD-10-CM | POA: Diagnosis present

## 2023-07-22 DIAGNOSIS — G8929 Other chronic pain: Secondary | ICD-10-CM

## 2023-07-22 DIAGNOSIS — M069 Rheumatoid arthritis, unspecified: Secondary | ICD-10-CM | POA: Diagnosis present

## 2023-07-22 DIAGNOSIS — M25511 Pain in right shoulder: Secondary | ICD-10-CM | POA: Diagnosis present

## 2023-07-22 DIAGNOSIS — Z0289 Encounter for other administrative examinations: Secondary | ICD-10-CM | POA: Diagnosis present

## 2023-07-22 DIAGNOSIS — M19012 Primary osteoarthritis, left shoulder: Secondary | ICD-10-CM | POA: Insufficient documentation

## 2023-07-22 DIAGNOSIS — G894 Chronic pain syndrome: Secondary | ICD-10-CM | POA: Diagnosis present

## 2023-07-22 DIAGNOSIS — Z96641 Presence of right artificial hip joint: Secondary | ICD-10-CM | POA: Diagnosis present

## 2023-07-22 DIAGNOSIS — M1611 Unilateral primary osteoarthritis, right hip: Secondary | ICD-10-CM

## 2023-07-22 DIAGNOSIS — M19011 Primary osteoarthritis, right shoulder: Secondary | ICD-10-CM

## 2023-07-22 MED ORDER — HYDROCODONE-ACETAMINOPHEN 7.5-325 MG PO TABS
1.0000 | ORAL_TABLET | Freq: Three times a day (TID) | ORAL | 0 refills | Status: AC | PRN
Start: 2023-07-29 — End: 2023-08-28

## 2023-07-22 MED ORDER — HYDROCODONE-ACETAMINOPHEN 7.5-325 MG PO TABS: 1.0000 | ORAL_TABLET | Freq: Three times a day (TID) | ORAL | 0 refills | Status: AC | PRN

## 2023-07-22 MED ORDER — HYDROCODONE-ACETAMINOPHEN 7.5-325 MG PO TABS: 1.0000 | ORAL_TABLET | Freq: Three times a day (TID) | ORAL | 0 refills | Status: DC | PRN

## 2023-07-22 NOTE — Progress Notes (Signed)
Nursing Pain Medication Assessment:  Safety precautions to be maintained throughout the outpatient stay will include: orient to surroundings, keep bed in low position, maintain call bell within reach at all times, provide assistance with transfer out of bed and ambulation.  Medication Inspection Compliance: Pill count conducted under aseptic conditions, in front of the patient. Neither the pills nor the bottle was removed from the patient's sight at any time. Once count was completed pills were immediately returned to the patient in their original bottle.  Medication: Hydrocodone/APAP Pill/Patch Count:  33 of 90 pills remain Pill/Patch Appearance: Markings consistent with prescribed medication Bottle Appearance: Standard pharmacy container. Clearly labeled. Filled Date: 08 / 20 / 2024 Last Medication intake:  TodaySafety precautions to be maintained throughout the outpatient stay will include: orient to surroundings, keep bed in low position, maintain call bell within reach at all times, provide assistance with transfer out of bed and ambulation.

## 2023-07-22 NOTE — Progress Notes (Signed)
PROVIDER NOTE: Information contained herein reflects review and annotations entered in association with encounter. Interpretation of such information and data should be left to medically-trained personnel. Information provided to patient can be located elsewhere in the medical record under "Patient Instructions". Document created using STT-dictation technology, any transcriptional errors that may result from process are unintentional.    Patient: Candace Juarez  Service Category: E/M  Provider: Edward Jolly, MD  DOB: May 18, 1951  DOS: 07/22/2023  Referring Provider: Enid Baas, MD  MRN: [id number]  Specialty: Interventional Pain Management  PCP: Enid Baas, MD  Type: Established Patient  Setting: Ambulatory outpatient    Location: Office  Delivery: Face-to-face     HPI  Candace Juarez, a 72 y.o. year old female, is here today because of her Primary osteoarthritis of right hip [M16.11]. Candace Juarez primary complain today is Pain (Lupus, rheumatoid, and osteoarthritis)  Pertinent problems: Ms. Peyer has Polyarthralgia; Status post right hip replacement; H/O right knee surgery; Chronic pain of both knees; Chronic right hip pain; Chronic pain syndrome; and Pain management contract signed on their pertinent problem list. Pain Assessment: Severity of Chronic pain is reported as a 2 /10. Location: Other (Comment)  / . Onset: More than a month ago. Quality: Burning, Sharp, Tightness. Timing: Intermittent. Modifying factor(s): heating pad, medications, neck pillow, rest. Vitals:  height is 5\' 2"  (1.575 m) and weight is 232 lb (105.2 kg). Her temporal temperature is 98.4 F (36.9 C). Her blood pressure is 130/52 (abnormal) and her pulse is 70. Her respiration is 14 and oxygen saturation is 99%.  BMI: Estimated body mass index is 42.43 kg/m as calculated from the following:   Height as of this encounter: 5\' 2"  (1.575 m).   Weight as of this encounter: 232 lb (105.2 kg). Last encounter:  04/22/2023. Last procedure: 12/02/2022.  Reason for encounter: medication management.   Patient follows up today for medication management status post right hip replacement surgery.  She states that she had a great experience and was very pleased with her care team.  She has no right hip pain.  She continues with her multimodal analgesics as prescribed, needs a refill of hydrocodone as below, no change in dose.  Otherwise her shoulders are doing well if she does not need any injection.  Pharmacotherapy Assessment  Analgesic: Norco 7.5 mg 3 times daily as needed, quantity 90/month; MME equals 22.5     Monitoring: [place] PMP: PDMP reviewed during this encounter.       Pharmacotherapy: No side-effects or adverse reactions reported. Compliance: No problems identified. Effectiveness: Clinically acceptable.  Concepcion Elk, RN  07/22/2023  2:33 PM  Sign when Signing Visit Nursing Pain Medication Assessment:  Safety precautions to be maintained throughout the outpatient stay will include: orient to surroundings, keep bed in low position, maintain call bell within reach at all times, provide assistance with transfer out of bed and ambulation.  Medication Inspection Compliance: Pill count conducted under aseptic conditions, in front of the patient. Neither the pills nor the bottle was removed from the patient's sight at any time. Once count was completed pills were immediately returned to the patient in their original bottle.  Medication: Hydrocodone/APAP Pill/Patch Count:  33 of 90 pills remain Pill/Patch Appearance: Markings consistent with prescribed medication Bottle Appearance: Standard pharmacy container. Clearly labeled. Filled Date: 08 / 20 / 2024 Last Medication intake:  TodaySafety precautions to be maintained throughout the outpatient stay will include: orient to surroundings, keep bed in low position, maintain call bell  within reach at all times, provide assistance with transfer out of bed  and ambulation.   No results found for: "CBDTHCR" No results found for: "D8THCCBX" No results found for: "D9THCCBX"  UDS:  Summary  Date Value Ref Range Status  09/22/2022 Note  Final    Comment:    ==================================================================== Compliance Drug Analysis, Ur ==================================================================== Test                             Result       Flag       Units  Drug Present and Declared for Prescription Verification   Alprazolam                     148          EXPECTED   ng/mg creat   Alpha-hydroxyalprazolam        130          EXPECTED   ng/mg creat    Source of alprazolam is a scheduled prescription medication. Alpha-    hydroxyalprazolam is an expected metabolite of alprazolam.    Hydrocodone                    3065         EXPECTED   ng/mg creat   Hydromorphone                  93           EXPECTED   ng/mg creat   Dihydrocodeine                 430          EXPECTED   ng/mg creat   Norhydrocodone                 3508         EXPECTED   ng/mg creat    Sources of hydrocodone include scheduled prescription medications.    Hydromorphone, dihydrocodeine and norhydrocodone are expected    metabolites of hydrocodone. Hydromorphone and dihydrocodeine are    also available as scheduled prescription medications.    Bupropion                      PRESENT      EXPECTED   Hydroxybupropion               PRESENT      EXPECTED    Hydroxybupropion is an expected metabolite of bupropion.    Venlafaxine                    PRESENT      EXPECTED   Desmethylvenlafaxine           PRESENT      EXPECTED    Desmethylvenlafaxine is an expected metabolite of venlafaxine.    Acetaminophen                  PRESENT      EXPECTED   Metoprolol                     PRESENT      EXPECTED  Drug Present not Declared for Prescription Verification   Ephedrine/Pseudoephedrine      PRESENT      UNEXPECTED   Phenylpropanolamine            PRESENT  UNEXPECTED    Source of ephedrine/pseudoephedrine is most commonly pseudoephedrine    in over-the-counter or prescription cold and allergy medications.    Phenylpropanolamine is an expected metabolite of    ephedrine/pseudoephedrine.  ==================================================================== Test                      Result    Flag   Units      Ref Range   Creatinine              60               mg/dL      >=86 ==================================================================== Declared Medications:  The flagging and interpretation on this report are based on the  following declared medications.  Unexpected results may arise from  inaccuracies in the declared medications.   **Note: The testing scope of this panel includes these medications:   Alprazolam (Xanax)  Bupropion (Wellbutrin)  Hydrocodone (Norco)  Metoprolol  Venlafaxine (Effexor)   **Note: The testing scope of this panel does not include small to  moderate amounts of these reported medications:   Acetaminophen (Norco)   **Note: The testing scope of this panel does not include the  following reported medications:   Abatacept  Aluminum Hydroxide (Maalox)  Atropine (Lomotil)  Cetirizine (Zyrtec)  Cyclosporine (Restasis)  Denosumab (Prolia)  Diphenoxylate (Lomotil)  Estradiol (Estrace)  Fluticasone (Flonase)  Hydroxychloroquine (Plaquenil)  Levothyroxine (Synthroid)  Magnesium Hydroxide (Maalox)  Nystatin (Mycostatin)  Polyethylene Glycol  Prednisone  Torsemide (Demadex) ==================================================================== For clinical consultation, please call [phone]. ====================================================================       ROS  Constitutional: Denies any fever or chills Gastrointestinal: No reported hemesis, hematochezia, vomiting, or acute GI distress Musculoskeletal: Denies any acute onset joint swelling, redness, loss of ROM, or  weakness Neurological: No reported episodes of acute onset apraxia, aphasia, dysarthria, agnosia, amnesia, paralysis, loss of coordination, or loss of consciousness  Medication Review  ALPRAZolam, Abatacept, HYDROcodone-acetaminophen, Metoprolol Succinate, Polyethyl Glycol-Propyl Glycol, acetaminophen, apixaban, buPROPion, cetirizine, cholecalciferol, cycloSPORINE, denosumab, diclofenac Sodium, diphenoxylate-atropine, estradiol, fluticasone, hydroxychloroquine, levothyroxine, losartan, metroNIDAZOLE, mupirocin 2% oint-hydrocortisone 2.5% cream-nystatin cream-zinc oxide 13% oint 1:1:1:5 mixture, ondansetron, pantoprazole, pseudoephedrine, simethicone, torsemide, traMADol, and venlafaxine XR  History Review  Allergy: Ms. Liscio is allergic to rifampin, sulfa antibiotics, cephalexin, nsaids, and sulfur. Drug: Ms. Farver  reports no history of drug use. Alcohol:  reports no history of alcohol use. Tobacco:  reports that she has never smoked. She has never used smokeless tobacco. Social: Ms. Iseman  reports that she has never smoked. She has never used smokeless tobacco. She reports that she does not drink alcohol and does not use drugs. Medical:  has a past medical history of Anxiety, Arthritis, CAD (coronary artery disease) (10/08/2021), CHF (congestive heart failure) (HCC) (08/27/2021), Chicken pox, Chronic kidney disease, Chronic pain syndrome, Chronic, continuous use of opioids, Complication of anesthesia, DDD (degenerative disc disease), cervical, Depression, Gastric ulcer, GERD (gastroesophageal reflux disease), History of bilateral cataract extraction, History of hiatal hernia, HLD (hyperlipidemia), HTN (hypertension), Hypothyroidism, Measles, Mumps, Osteoporosis, Rheumatoid arthritis (HCC), RLS (restless legs syndrome), Rosacea, Sjogren's disease (HCC), SLE (systemic lupus erythematosus) (HCC), and Therapeutic opioid-induced constipation (OIC). Surgical: Ms. Roeper  has a past surgical history that includes  Cholesteatoma excision (Right); Colonoscopy; Breast enhancement surgery (2004); Cholecystectomy; Laparoscopic incisional / umbilical / ventral hernia repair (2007); Total laparoscopic hysterectomy with bilateral salpingo oophorectomy (Bilateral, 10/24/2020); Cystocele repair (N/A, 10/24/2020); Upper gastrointestinal endoscopy; Cataract extraction (Bilateral); Combined augmentation mammaplasty and abdominoplasty (1978); Limb sparing resection hip  w/ saddle joint replacement (2008); Nasal endoscopy (2009); Endovenous ablation saphenous vein w/ laser (N/A); Tubal ligation (Bilateral, 1975); Knee arthroscopy (Right, 2010); Knee Arthroplasty (Left); Hip Arthroplasty (Left); and Total hip arthroplasty (Right, 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 Family: family history is not on file.  Laboratory Chemistry Profile   Renal Lab Results  Component Value Date   BUN 30 (H) 05/12/2023   CREATININE 1.20 (H) 05/12/2023   GFRNONAA 48 (L) 05/12/2023    Hepatic Lab Results  Component Value Date   AST 21 04/30/2023   ALT 20 04/30/2023   ALBUMIN 3.7 04/30/2023   ALKPHOS 66 04/30/2023   LIPASE 28 09/06/2020    Electrolytes Lab Results  Component Value Date   NA 132 (L) 05/12/2023   K 4.5 05/12/2023   CL 97 (L) 05/12/2023   CALCIUM 7.4 (L) 05/12/2023    Bone No results found for: "VD25OH", "VD125OH2TOT", "UJ8119JY7", "WG9562ZH0", "25OHVITD1", "25OHVITD2", "25OHVITD3", "TESTOFREE", "TESTOSTERONE"  Inflammation (CRP: Acute Phase) (ESR: Chronic Phase) No results found for: "CRP", "ESRSEDRATE", "LATICACIDVEN"       Note: Above Lab results reviewed.  Recent Imaging Review  DG HIP UNILAT W OR W/O PELVIS 2-3 VIEWS RIGHT CLINICAL DATA:  Status post total right hip arthroplasty.  EXAM: DG HIP (WITH OR WITHOUT PELVIS) 2-3V RIGHT  COMPARISON:  Right hip radiographs 11/13/2022  FINDINGS: Interval total right hip arthroplasty. Redemonstration of prior total left hip arthroplasty. No perihardware lucency is seen to indicate  hardware failure or loosening. Expected postoperative changes including lateral right hip subcutaneous air and lateral surgical skin staples.  Mild subchondral sclerosis within the visualized inferior sacroiliac joints. No acute fracture or dislocation.  IMPRESSION: Status post total right hip arthroplasty without evidence of hardware failure.  Electronically Signed   By: Neita Garnet M.D.   On: 05/11/2023 13:55 Note: Reviewed        Physical Exam  General appearance: Well nourished, well developed, and well hydrated. In no apparent acute distress Mental status: Alert, oriented x 3 (person, place, & time)       Respiratory: No evidence of acute respiratory distress Eyes: PERLA Vitals: BP (!) 130/52   Pulse 70   Temp 98.4 F (36.9 C) (Temporal)   Resp 14   Ht 5\' 2"  (1.575 m)   Wt 232 lb (105.2 kg)   SpO2 99%   BMI 42.43 kg/m  BMI: Estimated body mass index is 42.43 kg/m as calculated from the following:   Height as of this encounter: 5\' 2"  (1.575 m).   Weight as of this encounter: 232 lb (105.2 kg). Ideal: Ideal body weight: 50.1 kg (110 lb 7.2 oz) Adjusted ideal body weight: 72.2 kg (159 lb 1.1 oz)  Assessment   Diagnosis Status  1. Primary osteoarthritis of right hip   2. Status post right hip replacement   3. Chronic right hip pain   4. 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 Chronic pain of both shoulders   6. Chronic pain syndrome   7. Rheumatoid arthritis involving multiple sites, unspecified whether rheumatoid factor present (HCC)   8. Pain management contract signed    Controlled Controlled Controlled   Updated Problems: Problem  Status Post Right Hip Replacement    Plan of Care  Problem-specific:  No problem-specific Assessment & Plan notes found for this encounter.  Ms. Kymari Buckson has a current medication list which includes the following long-term medication(s): abatacept, apixaban, bupropion, cetirizine, estradiol,  fluticasone, levothyroxine, losartan, metoprolol succinate, pantoprazole, torsemide, and venlafaxine xr.  Pharmacotherapy (Medications  Ordered): Meds ordered this encounter  Medications   HYDROcodone-acetaminophen (NORCO) 7.5-325 MG tablet    Sig: Take 1 tablet by mouth 3 (three) times daily as needed for severe pain. Must last 30 days    Dispense:  90 tablet    Refill:  0    Chronic Pain: STOP Act (Not applicable) Fill 1 day early if closed on refill date. Avoid benzodiazepines within 8 hours of opioids   HYDROcodone-acetaminophen (NORCO) 7.5-325 MG tablet    Sig: Take 1 tablet by mouth 3 (three) times daily as needed for severe pain. Must last 30 days    Dispense:  90 tablet    Refill:  0    Chronic Pain: STOP Act (Not applicable) Fill 1 day early if closed on refill date. Avoid benzodiazepines within 8 hours of opioids   HYDROcodone-acetaminophen (NORCO) 7.5-325 MG tablet    Sig: Take 1 tablet by mouth 3 (three) times daily as needed for severe pain. Must last 30 days    Dispense:  90 tablet    Refill:  0    Chronic Pain: STOP Act (Not applicable) Fill 1 day early if closed on refill date. Avoid benzodiazepines within 8 hours of opioids     Follow-up plan:   Return in about 3 months (around 10/26/2023) for MM, F2F.      Recent Visits No visits were found meeting these conditions. Showing recent visits within past 90 days and meeting all other requirements Today's Visits Date Type Provider Dept  07/22/23 Office Visit Edward Jolly, MD [facility]  Showing today's visits and meeting all other requirements Future Appointments No visits were found meeting these conditions. Showing future appointments within next 90 days and meeting all other requirements  I discussed the assessment and treatment plan with the patient. The patient was provided an opportunity to ask questions and all were answered. The patient agreed with the plan and demonstrated an understanding of the  instructions.  Patient advised to call back or seek an in-person evaluation if the symptoms or condition worsens.  Duration of encounter: .  Total time on encounter, as per AMA guidelines included both the face-to-face and non-face-to-face time personally spent by the physician and/or other qualified health care professional(s) on the day of the encounter (includes time in activities that require the physician or other qualified health care professional and does not include time in activities normally performed by clinical staff). Physician's time may include the following activities when performed: Preparing to see the patient (e.g., pre-charting review of records, searching for previously ordered imaging, lab work, and nerve conduction tests) Review of prior analgesic pharmacotherapies. Reviewing PMP Interpreting ordered tests (e.g., lab work, imaging, nerve conduction tests) Performing post-procedure evaluations, including interpretation of diagnostic procedures Obtaining and/or reviewing separately obtained history Performing a medically appropriate examination and/or evaluation Counseling and educating the patient/family/caregiver Ordering medications, tests, or procedures Referring and communicating with other health care professionals (when not separately reported) Documenting clinical information in the electronic or other health record Independently interpreting results (not separately reported) and communicating results to the patient/ family/caregiver Care coordination (not separately reported)  Note by: Edward Jolly, MD Date: 07/22/2023; Time: 3:30 PM

## 2023-10-13 ENCOUNTER — Encounter: Payer: Self-pay | Admitting: Student in an Organized Health Care Education/Training Program

## 2023-10-13 ENCOUNTER — Ambulatory Visit
Payer: Medicare Other | Attending: Student in an Organized Health Care Education/Training Program | Admitting: Student in an Organized Health Care Education/Training Program

## 2023-10-13 VITALS — BP 169/70 | HR 85 | Temp 97.3°F | Resp 18 | Ht 62.0 in | Wt 236.0 lb

## 2023-10-13 DIAGNOSIS — M069 Rheumatoid arthritis, unspecified: Secondary | ICD-10-CM | POA: Diagnosis present

## 2023-10-13 DIAGNOSIS — G894 Chronic pain syndrome: Secondary | ICD-10-CM | POA: Insufficient documentation

## 2023-10-13 DIAGNOSIS — Z0289 Encounter for other administrative examinations: Secondary | ICD-10-CM | POA: Insufficient documentation

## 2023-10-13 MED ORDER — HYDROCODONE-ACETAMINOPHEN 7.5-325 MG PO TABS: 1.0000 | ORAL_TABLET | Freq: Three times a day (TID) | ORAL | 0 refills | Status: AC | PRN

## 2023-10-13 MED ORDER — HYDROCODONE-ACETAMINOPHEN 7.5-325 MG PO TABS: 1.0000 | ORAL_TABLET | Freq: Three times a day (TID) | ORAL | 0 refills | Status: DC | PRN

## 2023-10-13 NOTE — Progress Notes (Signed)
Nursing Pain Medication Assessment:  Safety precautions to be maintained throughout the outpatient stay will include: orient to surroundings, keep bed in low position, maintain call bell within reach at all times, provide assistance with transfer out of bed and ambulation.  Medication Inspection Compliance: Pill count conducted under aseptic conditions, in front of the patient. Neither the pills nor the bottle was removed from the patient's sight at any time. Once count was completed pills were immediately returned to the patient in their original bottle.  Medication: Hydrocodone/APAP Pill/Patch Count:  56 of 90 pills remain Pill/Patch Appearance: Markings consistent with prescribed medication Bottle Appearance: Standard pharmacy container. Clearly labeled. Filled Date: 6 / 20 / 2024 Last Medication intake:  Today

## 2023-10-13 NOTE — Progress Notes (Signed)
PROVIDER NOTE: Information contained herein reflects review and annotations entered in association with encounter. Interpretation of such information and data should be left to medically-trained personnel. Information provided to patient can be located elsewhere in the medical record under "Patient Instructions". Document created using STT-dictation technology, any transcriptional errors that may result from process are unintentional.    Patient: Candace Juarez  Service Category: E/M  Provider: Edward Jolly, MD  DOB: 03-26-1951  DOS: 10/13/2023  Referring Provider: Enid Baas, MD  MRN: [id number]  Specialty: Interventional Pain Management  PCP: Enid Baas, MD  Type: Established Patient  Setting: Ambulatory outpatient    Location: Office  Delivery: Face-to-face     HPI  Candace Juarez, a 72 y.o. year old female, is here today because of her Chronic pain syndrome [G89.4]. [name] primary complain today is Back Pain (Left lower), Hand Pain, and Neck Pain  Pertinent problems: Ms. Trettin has Polyarthralgia; Status post right hip replacement; H/O right knee surgery; Chronic pain of both knees; Chronic right hip pain; Chronic pain syndrome; and Pain management contract signed on their pertinent problem list. Pain Assessment: Severity of Chronic pain is reported as a 2 /10. Location: Back Lower, Left/ . Onset: More than a month ago. Quality: Aching, Sore, Sharp. Timing: Intermittent. Modifying factor(s): meds, rest. Vitals:  height is 5\' 2"  (1.575 m) and weight is 236 lb (107 kg). Her temperature is 97.3 F (36.3 C) (abnormal). Her blood pressure is 169/70 (abnormal) and her pulse is 85. Her respiration is 18 and oxygen saturation is 97%.  BMI: Estimated body mass index is 43.16 kg/m as calculated from the following:   Height as of this encounter: 5\' 2"  (1.575 m).   Weight as of this encounter: 236 lb (107 kg). Last encounter: 07/22/2023. Last procedure: 12/02/2022.  Reason for encounter:  medication management. Discussed the use of AI scribe software for clinical note transcription with the patient, who gave verbal consent to proceed.  History of Present Illness   The patient, known with a history of chronic lower back pain, reports a new symptom of a burning sensation in the lower back. This sensation is described as being related to overexertion, particularly after activities such as laundry, shopping, and changing bed linens. The discomfort is noted to dissipate upon rest.  In addition to the lower back pain, the patient has a history of shoulder pain, which has significantly improved following recent injections. The patient reports no recurrence of shoulder pain since the injections and expresses satisfaction with the treatment outcome.  The patient is also on a prescription of hydrocodone, which is due for a refill. No new medications have been added to the regimen and no other health concerns were raised during the consultation. The patient is due for an annual urine screen.       Pharmacotherapy Assessment  Analgesic: Norco 7.5 mg 3 times daily as needed, quantity 90/month; MME equals 22.5     Monitoring: [place] PMP: PDMP reviewed during this encounter.       Pharmacotherapy: No side-effects or adverse reactions reported. Compliance: No problems identified. Effectiveness: Clinically acceptable.  Valerie Salts, RN  10/13/2023  2:03 PM  Sign when Signing Visit Nursing Pain Medication Assessment:  Safety precautions to be maintained throughout the outpatient stay will include: orient to surroundings, keep bed in low position, maintain call bell within reach at all times, provide assistance with transfer out of bed and ambulation.  Medication Inspection Compliance: Pill count conducted under aseptic conditions, in  front of the patient. Neither the pills nor the bottle was removed from the patient's sight at any time. Once count was completed pills were immediately returned to the  patient in their original bottle.  Medication: Hydrocodone/APAP Pill/Patch Count:  56 of 90 pills remain Pill/Patch Appearance: Markings consistent with prescribed medication Bottle Appearance: Standard pharmacy container. Clearly labeled. Filled Date: 64 / 20 / 2024 Last Medication intake:  Today    No results found for: "CBDTHCR" No results found for: "D8THCCBX" No results found for: "D9THCCBX"  UDS:  Summary  Date Value Ref Range Status  09/22/2022 Note  Final    Comment:    ==================================================================== Compliance Drug Analysis, Ur ==================================================================== Test                             Result       Flag       Units  Drug Present and Declared for Prescription Verification   Alprazolam                     148          EXPECTED   ng/mg creat   Alpha-hydroxyalprazolam        130          EXPECTED   ng/mg creat    Source of alprazolam is a scheduled prescription medication. Alpha-    hydroxyalprazolam is an expected metabolite of alprazolam.    Hydrocodone                    3065         EXPECTED   ng/mg creat   Hydromorphone                  93           EXPECTED   ng/mg creat   Dihydrocodeine                 430          EXPECTED   ng/mg creat   Norhydrocodone                 3508         EXPECTED   ng/mg creat    Sources of hydrocodone include scheduled prescription medications.    Hydromorphone, dihydrocodeine and norhydrocodone are expected    metabolites of hydrocodone. Hydromorphone and dihydrocodeine are    also available as scheduled prescription medications.    Bupropion                      PRESENT      EXPECTED   Hydroxybupropion               PRESENT      EXPECTED    Hydroxybupropion is an expected metabolite of bupropion.    Venlafaxine                    PRESENT      EXPECTED   Desmethylvenlafaxine           PRESENT      EXPECTED    Desmethylvenlafaxine is an expected  metabolite of venlafaxine.    Acetaminophen                  PRESENT      EXPECTED   Metoprolol  PRESENT      EXPECTED  Drug Present not Declared for Prescription Verification   Ephedrine/Pseudoephedrine      PRESENT      UNEXPECTED   Phenylpropanolamine            PRESENT      UNEXPECTED    Source of ephedrine/pseudoephedrine is most commonly pseudoephedrine    in over-the-counter or prescription cold and allergy medications.    Phenylpropanolamine is an expected metabolite of    ephedrine/pseudoephedrine.  ==================================================================== Test                      Result    Flag   Units      Ref Range   Creatinine              60               mg/dL      >=29 ==================================================================== Declared Medications:  The flagging and interpretation on this report are based on the  following declared medications.  Unexpected results may arise from  inaccuracies in the declared medications.   **Note: The testing scope of this panel includes these medications:   Alprazolam (Xanax)  Bupropion (Wellbutrin)  Hydrocodone (Norco)  Metoprolol  Venlafaxine (Effexor)   **Note: The testing scope of this panel does not include small to  moderate amounts of these reported medications:   Acetaminophen (Norco)   **Note: The testing scope of this panel does not include the  following reported medications:   Abatacept  Aluminum Hydroxide (Maalox)  Atropine (Lomotil)  Cetirizine (Zyrtec)  Cyclosporine (Restasis)  Denosumab (Prolia)  Diphenoxylate (Lomotil)  Estradiol (Estrace)  Fluticasone (Flonase)  Hydroxychloroquine (Plaquenil)  Levothyroxine (Synthroid)  Magnesium Hydroxide (Maalox)  Nystatin (Mycostatin)  Polyethylene Glycol  Prednisone  Torsemide (Demadex) ==================================================================== For clinical consultation, please call [phone]. ====================================================================       ROS  Constitutional: Denies any fever or chills Gastrointestinal: No reported hemesis, hematochezia, vomiting, or acute GI distress Musculoskeletal:  LBP Neurological: No reported episodes of acute onset apraxia, aphasia, dysarthria, agnosia, amnesia, paralysis, loss of coordination, or loss of consciousness  Medication Review  ALPRAZolam, Abatacept, HYDROcodone-acetaminophen, Metoprolol Succinate, Polyethyl Glycol-Propyl Glycol, acetaminophen, apixaban, buPROPion, cetirizine, cholecalciferol, cycloSPORINE, denosumab, diclofenac Sodium, diphenoxylate-atropine, estradiol, fluticasone, hydroxychloroquine, levothyroxine, losartan, metroNIDAZOLE, mupirocin 2% oint-hydrocortisone 2.5% cream-nystatin cream-zinc oxide 13% oint 1:1:1:5 mixture, ondansetron, pantoprazole, pseudoephedrine, simethicone, torsemide, traMADol, and venlafaxine XR  History Review  Allergy: Ms. Stilp is allergic to rifampin, sulfa antibiotics, cephalexin, nsaids, and sulfur. Drug: Ms. Areola  reports no history of drug use. Alcohol:  reports no history of alcohol use. Tobacco:  reports that she has never smoked. She has never used smokeless tobacco. Social: Ms. Plueger  reports that she has never smoked. She has never used smokeless tobacco. She reports that she does not drink alcohol and does not use drugs. Medical:  has a past medical history of Anxiety, Arthritis, CAD (coronary artery disease) (10/08/2021), CHF (congestive heart failure) (HCC) (08/27/2021), Chicken pox, Chronic kidney disease, Chronic pain syndrome, Chronic, continuous use of opioids, Complication of anesthesia, DDD (degenerative disc disease), cervical, Depression, Gastric ulcer, GERD (gastroesophageal reflux disease), History of bilateral cataract extraction, History of hiatal hernia, HLD (hyperlipidemia), HTN (hypertension), Hypothyroidism, Measles, Mumps, Osteoporosis,  Rheumatoid arthritis (HCC), RLS (restless legs syndrome), Rosacea, Sjogren's disease (HCC), SLE (systemic lupus erythematosus) (HCC), and Therapeutic opioid-induced constipation (OIC). Surgical: Ms. Goulart  has a past surgical history that includes Cholesteatoma excision (Right); Colonoscopy; Breast enhancement surgery (  2004); Cholecystectomy; Laparoscopic incisional / umbilical / ventral hernia repair (2007); Total laparoscopic hysterectomy with bilateral salpingo oophorectomy (Bilateral, 10/24/2020); Cystocele repair (N/A, 10/24/2020); Upper gastrointestinal endoscopy; Cataract extraction (Bilateral); Combined augmentation mammaplasty and abdominoplasty (1978); Limb sparing resection hip w/ saddle joint replacement (2008); Nasal endoscopy (2009); Endovenous ablation saphenous vein w/ laser (N/A); Tubal ligation (Bilateral, 1975); Knee arthroscopy (Right, 2010); Knee Arthroplasty (Left); Hip Arthroplasty (Left); and Total hip arthroplasty (Right, 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 Family: family history is not on file.  Laboratory Chemistry Profile   Renal Lab Results  Component Value Date   BUN 30 (H) 05/12/2023   CREATININE 1.20 (H) 05/12/2023   GFRNONAA 48 (L) 05/12/2023    Hepatic Lab Results  Component Value Date   AST 21 04/30/2023   ALT 20 04/30/2023   ALBUMIN 3.7 04/30/2023   ALKPHOS 66 04/30/2023   LIPASE 28 09/06/2020    Electrolytes Lab Results  Component Value Date   NA 132 (L) 05/12/2023   K 4.5 05/12/2023   CL 97 (L) 05/12/2023   CALCIUM 7.4 (L) 05/12/2023    Bone No results found for: "VD25OH", "VD125OH2TOT", "OZ3664QI3", "KV4259DG3", "25OHVITD1", "25OHVITD2", "25OHVITD3", "TESTOFREE", "TESTOSTERONE"  Inflammation (CRP: Acute Phase) (ESR: Chronic Phase) No results found for: "CRP", "ESRSEDRATE", "LATICACIDVEN"       Note: Above Lab results reviewed.  Recent Imaging Review  DG HIP UNILAT W OR W/O PELVIS 2-3 VIEWS RIGHT CLINICAL DATA:  Status post total right hip arthroplasty.  EXAM: DG  HIP (WITH OR WITHOUT PELVIS) 2-3V RIGHT  COMPARISON:  Right hip radiographs 11/13/2022  FINDINGS: Interval total right hip arthroplasty. Redemonstration of prior total left hip arthroplasty. No perihardware lucency is seen to indicate hardware failure or loosening. Expected postoperative changes including lateral right hip subcutaneous air and lateral surgical skin staples.  Mild subchondral sclerosis within the visualized inferior sacroiliac joints. No acute fracture or dislocation.  IMPRESSION: Status post total right hip arthroplasty without evidence of hardware failure.  Electronically Signed   By: Neita Garnet M.D.   On: 05/11/2023 13:55 Note: Reviewed        Physical Exam  General appearance: Well nourished, well developed, and well hydrated. In no apparent acute distress Mental status: Alert, oriented x 3 (person, place, & time)       Respiratory: No evidence of acute respiratory distress Eyes: PERLA Vitals: BP (!) 169/70   Pulse 85   Temp (!) 97.3 F (36.3 C)   Resp 18   Ht 5\' 2"  (1.575 m)   Wt 236 lb (107 kg)   SpO2 97%   BMI 43.16 kg/m  BMI: Estimated body mass index is 43.16 kg/m as calculated from the following:   Height as of this encounter: 5\' 2"  (1.575 m).   Weight as of this encounter: 236 lb (107 kg). Ideal: Ideal body weight: 50.1 kg (110 lb 7.2 oz) Adjusted ideal body weight: 72.9 kg (160 lb 10.7 oz)  Assessment   Diagnosis Status  1. Chronic pain syndrome   2. Rheumatoid arthritis involving multiple sites, unspecified whether rheumatoid factor present (HCC)   3. Pain management contract signed    Controlled Controlled Controlled     Plan of Care  Problem-specific:  Assessment and Plan    Lower Back Pain   She experiences intermittent burning sensations in her lower back muscles, primarily in the evenings after engaging in physical activities such as laundry, shopping, and changing bed linens. The pain lessens with rest. We discussed  the importance of avoiding overexertion and taking breaks  during physical activities. We will advise her to avoid overexertion and take breaks during physical activities. We will consider physical therapy if symptoms persist.  Shoulder Pain   She has previously been treated for shoulder pain with injections, which significantly relieved the pain. Currently, she reports no pain. We discussed the potential need for repeat injections if the pain returns. We will monitor for the recurrence of shoulder pain and consider repeat injections if pain returns.  Chronic Pain Management   She is on ongoing management of chronic pain with hydrocodone. The current prescription will be refilled. We discussed the refill schedule and stressed the importance of adhering to the prescribed regimen. We will refill the hydrocodone prescription for the next three months with fill dates on December 20th, January 19th, and February 18th. A follow-up appointment is scheduled in March.  General Health Maintenance   An annual urine screen is required as part of routine health maintenance. We will renew the urine screen today.  Follow-up   A follow-up appointment is scheduled around March 20th.       Ms. Estrella Luciano has a current medication list which includes the following long-term medication(s): abatacept, apixaban, bupropion, cetirizine, estradiol, fluticasone, levothyroxine, losartan, metoprolol succinate, pantoprazole, torsemide, and venlafaxine xr.  Pharmacotherapy (Medications Ordered): Meds ordered this encounter  Medications   HYDROcodone-acetaminophen (NORCO) 7.5-325 MG tablet    Sig: Take 1 tablet by mouth 3 (three) times daily as needed for severe pain (pain score 7-10). Must last 30 days    Dispense:  90 tablet    Refill:  0    Chronic Pain: STOP Act (Not applicable) Fill 1 day early if closed on refill date. Avoid benzodiazepines within 8 hours of opioids   HYDROcodone-acetaminophen (NORCO) 7.5-325 MG  tablet    Sig: Take 1 tablet by mouth 3 (three) times daily as needed for severe pain (pain score 7-10). Must last 30 days    Dispense:  90 tablet    Refill:  0    Chronic Pain: STOP Act (Not applicable) Fill 1 day early if closed on refill date. Avoid benzodiazepines within 8 hours of opioids   HYDROcodone-acetaminophen (NORCO) 7.5-325 MG tablet    Sig: Take 1 tablet by mouth 3 (three) times daily as needed for severe pain (pain score 7-10). Must last 30 days    Dispense:  90 tablet    Refill:  0    Chronic Pain: STOP Act (Not applicable) Fill 1 day early if closed on refill date. Avoid benzodiazepines within 8 hours of opioids   Orders:  Orders Placed This Encounter  Procedures   ToxASSURE Select 13 (MW), Urine    Volume: 30 ml(s). Minimum 3 ml of urine is needed. Document temperature of fresh sample. Indications: Long term (current) use of opiate analgesic (L24.401)    Order Specific Question:   Release to patient    Answer:   Immediate   Follow-up plan:   Return in about 3 months (around 01/27/2024) for MM, F2F.     Recent Visits Date Type Provider Dept  07/22/23 Office Visit Edward Jolly, MD [facility]  Showing recent visits within past 90 days and meeting all other requirements Today's Visits Date Type Provider Dept  10/13/23 Office Visit Edward Jolly, MD [facility]  Showing today's visits and meeting all other requirements Future Appointments No visits were found meeting these conditions. Showing future appointments within next 90 days and meeting all other requirements  I discussed the assessment and treatment  plan with the patient. The patient was provided an opportunity to ask questions and all were answered. The patient agreed with the plan and demonstrated an understanding of the instructions.  Patient advised to call back or seek an in-person evaluation if the symptoms or condition worsens.  Duration of encounter: .  Total time on  encounter, as per AMA guidelines included both the face-to-face and non-face-to-face time personally spent by the physician and/or other qualified health care professional(s) on the day of the encounter (includes time in activities that require the physician or other qualified health care professional and does not include time in activities normally performed by clinical staff). Physician's time may include the following activities when performed: Preparing to see the patient (e.g., pre-charting review of records, searching for previously ordered imaging, lab work, and nerve conduction tests) Review of prior analgesic pharmacotherapies. Reviewing PMP Interpreting ordered tests (e.g., lab work, imaging, nerve conduction tests) Performing post-procedure evaluations, including interpretation of diagnostic procedures Obtaining and/or reviewing separately obtained history Performing a medically appropriate examination and/or evaluation Counseling and educating the patient/family/caregiver Ordering medications, tests, or procedures Referring and communicating with other health care professionals (when not separately reported) Documenting clinical information in the electronic or other health record Independently interpreting results (not separately reported) and communicating results to the patient/ family/caregiver Care coordination (not separately reported)  Note by: Edward Jolly, MD Date: 10/13/2023; Time: 3:21 PM

## 2023-10-15 LAB — TOXASSURE SELECT 13 (MW), URINE

## 2023-10-21 ENCOUNTER — Encounter: Payer: Medicare Other | Admitting: Student in an Organized Health Care Education/Training Program

## 2024-01-25 ENCOUNTER — Encounter: Payer: Self-pay | Admitting: Student in an Organized Health Care Education/Training Program

## 2024-01-25 ENCOUNTER — Ambulatory Visit
Payer: Medicare Other | Attending: Student in an Organized Health Care Education/Training Program | Admitting: Student in an Organized Health Care Education/Training Program

## 2024-01-25 VITALS — BP 157/72 | HR 66 | Temp 98.4°F | Resp 16 | Ht 62.0 in | Wt 236.0 lb

## 2024-01-25 DIAGNOSIS — M19011 Primary osteoarthritis, right shoulder: Secondary | ICD-10-CM | POA: Insufficient documentation

## 2024-01-25 DIAGNOSIS — G894 Chronic pain syndrome: Secondary | ICD-10-CM

## 2024-01-25 DIAGNOSIS — Z0289 Encounter for other administrative examinations: Secondary | ICD-10-CM

## 2024-01-25 DIAGNOSIS — Z96642 Presence of left artificial hip joint: Secondary | ICD-10-CM | POA: Diagnosis present

## 2024-01-25 DIAGNOSIS — M25512 Pain in left shoulder: Secondary | ICD-10-CM | POA: Diagnosis present

## 2024-01-25 DIAGNOSIS — G8929 Other chronic pain: Secondary | ICD-10-CM

## 2024-01-25 DIAGNOSIS — M25511 Pain in right shoulder: Secondary | ICD-10-CM | POA: Diagnosis present

## 2024-01-25 DIAGNOSIS — M069 Rheumatoid arthritis, unspecified: Secondary | ICD-10-CM | POA: Diagnosis present

## 2024-01-25 DIAGNOSIS — M1712 Unilateral primary osteoarthritis, left knee: Secondary | ICD-10-CM

## 2024-01-25 DIAGNOSIS — M19012 Primary osteoarthritis, left shoulder: Secondary | ICD-10-CM | POA: Diagnosis not present

## 2024-01-25 DIAGNOSIS — M1611 Unilateral primary osteoarthritis, right hip: Secondary | ICD-10-CM

## 2024-01-25 MED ORDER — HYDROCODONE-ACETAMINOPHEN 7.5-325 MG PO TABS
1.0000 | ORAL_TABLET | Freq: Three times a day (TID) | ORAL | 0 refills | Status: DC | PRN
Start: 2024-04-01 — End: 2024-05-01

## 2024-01-25 MED ORDER — HYDROCODONE-ACETAMINOPHEN 7.5-325 MG PO TABS: 1.0000 | ORAL_TABLET | Freq: Three times a day (TID) | ORAL | 0 refills | Status: AC | PRN

## 2024-01-25 NOTE — Progress Notes (Signed)
 Nursing Pain Medication Assessment:  Safety precautions to be maintained throughout the outpatient stay will include: orient to surroundings, keep bed in low position, maintain call bell within reach at all times, provide assistance with transfer out of bed and ambulation.  Medication Inspection Compliance: Pill count conducted under aseptic conditions, in front of the patient. Neither the pills nor the bottle was removed from the patient's sight at any time. Once count was completed pills were immediately returned to the patient in their original bottle.  Medication: Hydrocodone/APAP Pill/Patch Count:  31 of 90 pills remain Pill/Patch Appearance: Markings consistent with prescribed medication Bottle Appearance: Standard pharmacy container. Clearly labeled. Filled Date: 02 / 23 / 2025 Last Medication intake:  Today

## 2024-01-25 NOTE — Progress Notes (Signed)
 PROVIDER NOTE: Information contained herein reflects review and annotations entered in association with encounter. Interpretation of such information and data should be left to medically-trained personnel. Information provided to patient can be located elsewhere in the medical record under "Patient Instructions". Document created using STT-dictation technology, any transcriptional errors that may result from process are unintentional.    Patient: Candace Juarez  Service Category: E/M  Provider: Edward Jolly, MD  DOB: 1951-04-18  DOS: 01/25/2024  Referring Provider: Enid Baas, MD  MRN: [id number]  Specialty: Interventional Pain Management  PCP: Enid Baas, MD  Type: Established Patient  Setting: Ambulatory outpatient    Location: Office  Delivery: Face-to-face     HPI  [name], a 73 y.o. year old female, is here today because of her Primary osteoarthritis of both shoulders [M19.011, M19.012]. [name] primary complain today is Back Pain (Upper midline to the left ), Shoulder Pain (Left ), and Knee Pain (Instability of left knee )  Pertinent problems: [name] has Polyarthralgia; Status post right hip replacement; H/O right knee surgery; Chronic pain of both knees; Chronic right hip pain; Chronic pain syndrome; and Pain management contract signed on their pertinent problem list. Pain Assessment: Severity of Chronic pain is reported as a 3 /10. Location: Back Upper/denies. Onset: More than a month ago. Quality: Discomfort, Sharp, Constant, Sore, Aching (knee pain is a sudden sharp pain that does not last very long and is associated with the knee "slipping out of place"). Timing: Intermittent. Modifying factor(s): movement, medications, heating pad, warm showers, warm weather. Vitals:  height is 5\' 2"  (1.575 m) and weight is 236 lb (107 kg). Her temporal temperature is 98.4 F (36.9 C). Her blood pressure is 157/72 (abnormal) and her pulse is 66. Her respiration is 16 and oxygen  saturation is 100%.  BMI: Estimated body mass index is 43.16 kg/m as calculated from the following:   Height as of this encounter: 5\' 2"  (1.575 m).   Weight as of this encounter: 236 lb (107 kg). Last encounter: 10/13/2023. Last procedure: Visit date not found.  Reason for encounter:   History of Present Illness   Candace Juarez is a 73 year old female with osteoarthritis who presents with left shoulder due to shoulder osteoarthritis and knee pain due to knee osteoarthritis.  She experiences aching pain in her left shoulder, similar to the pain she had before receiving injections last year. She recalls receiving shoulder injections approximately 15 months ago, which provided significant relief.  She had a left glenohumeral joint injection 10/05/2022 that provided her with 80% pain relief for approximately 15 months as well as improve range of motion.    She also experiences intermittent sharp pain and stiffness in her left knee, which she attributes to instability following her right hip replacement surgery in July. She describes the knee as occasionally 'sliding' and then returning to position, causing sharp pain. She has a history of a torn meniscus and arthritis in her right knee, which was surgically treated about ten years ago. She is considering gel injections for her left knee, as she has had previous experience with knee injections under the guidance of a rheumatologist.  She would like to avoid steroid injections.  Her current medications include hydrocodone, which is due for a refill on the 25th of the month, and she is no longer taking Eliquis or tramadol, which were used postoperatively. She continues to take Plaquenil and Prolia.      Pharmacotherapy Assessment  Analgesic: Norco 7.5 mg  3 times daily as needed, quantity 90/month; MME equals 22.5     Monitoring: [place] PMP: PDMP reviewed during this encounter.       Pharmacotherapy: No side-effects or adverse reactions  reported. Compliance: No problems identified. Effectiveness: Clinically acceptable.  Vernie Ammons, RN  01/25/2024  2:03 PM  Sign when Signing Visit Nursing Pain Medication Assessment:  Safety precautions to be maintained throughout the outpatient stay will include: orient to surroundings, keep bed in low position, maintain call bell within reach at all times, provide assistance with transfer out of bed and ambulation.  Medication Inspection Compliance: Pill count conducted under aseptic conditions, in front of the patient. Neither the pills nor the bottle was removed from the patient's sight at any time. Once count was completed pills were immediately returned to the patient in their original bottle.  Medication: Hydrocodone/APAP Pill/Patch Count:  31 of 90 pills remain Pill/Patch Appearance: Markings consistent with prescribed medication Bottle Appearance: Standard pharmacy container. Clearly labeled. Filled Date: 02 / 23 / 2025 Last Medication intake:  Today    No results found for: "CBDTHCR" No results found for: "D8THCCBX" No results found for: "D9THCCBX"  UDS:  Summary  Date Value Ref Range Status  10/13/2023 FINAL  Final    Comment:    ==================================================================== [employer] Select 13 (MW) ==================================================================== Specimen Alert Not Detected result may be consistent with the time of last use noted for this medication. AS NEEDED (Tramadol) ==================================================================== Test                             Result       Flag       Units  Drug Present and Declared for Prescription Verification   Alprazolam                     126          EXPECTED   ng/mg creat   Alpha-hydroxyalprazolam        81           EXPECTED   ng/mg creat    Source of alprazolam is a scheduled prescription medication. Alpha-    hydroxyalprazolam is an expected metabolite of  alprazolam.    Hydrocodone                    2394         EXPECTED   ng/mg creat   Dihydrocodeine                 545          EXPECTED   ng/mg creat   Norhydrocodone                 3185         EXPECTED   ng/mg creat    Sources of hydrocodone include scheduled prescription medications.    Dihydrocodeine and norhydrocodone are expected metabolites of    hydrocodone. Dihydrocodeine is also available as a scheduled    prescription medication.  Drug Absent but Declared for Prescription Verification   Tramadol                       Not Detected UNEXPECTED ng/mg creat ==================================================================== Test                      Result    Flag   Units  Ref Range   Creatinine              53               mg/dL      >=62 ==================================================================== Declared Medications:  The flagging and interpretation on this report are based on the  following declared medications.  Unexpected results may arise from  inaccuracies in the declared medications.   **Note: The testing scope of this panel includes these medications:   Alprazolam (Xanax)  Hydrocodone (Norco)  Tramadol (Ultram)   **Note: The testing scope of this panel does not include the  following reported medications:   Abatacept  Acetaminophen (Tylenol)  Acetaminophen (Norco)  Apixaban (Eliquis)  Atropine (Lomotil)  Bupropion (Wellbutrin XL)  Cetirizine (Zyrtec)  Cyclosporine (Restasis)  Denosumab (Prolia)  Diclofenac (Voltaren)  Diphenoxylate (Lomotil)  Estradiol (Estrace)  Fluticasone (Flonase)  Hydrocortisone  Hydroxychloroquine (Plaquenil)  Levothyroxine (Synthroid)  Losartan (Cozaar)  Metoprolol  Metronidazole (MetroCream)  Mupirocin  Nystatin  Ondansetron (Zofran)  Pantoprazole (Protonix)  Pseudoephedrine (Sudafed)  Simethicone (Mylicon)  Torsemide (Demadex)  Venlafaxine (Effexor)  Vitamin  D3 ==================================================================== For clinical consultation, please call [phone]. ====================================================================       ROS  Constitutional: Denies any fever or chills Gastrointestinal: No reported hemesis, hematochezia, vomiting, or acute GI distress Musculoskeletal:  Left shoulder pain, left knee pain worse with weightbearing Neurological: No reported episodes of acute onset apraxia, aphasia, dysarthria, agnosia, amnesia, paralysis, loss of coordination, or loss of consciousness  Medication Review  ALPRAZolam, Abatacept, HYDROcodone-acetaminophen, Metoprolol Succinate, Polyethyl Glycol-Propyl Glycol, acetaminophen, buPROPion, cetirizine, cycloSPORINE, denosumab, diclofenac Sodium, diphenoxylate-atropine, estradiol, fluticasone, hydroxychloroquine, levothyroxine, losartan, metroNIDAZOLE, mupirocin 2% oint-hydrocortisone 2.5% cream-nystatin cream-zinc oxide 13% oint 1:1:1:5 mixture, ondansetron, pantoprazole, pseudoephedrine, simethicone, torsemide, and venlafaxine XR  History Review  Allergy: [name] is allergic to rifampin, sulfa antibiotics, cephalexin, nsaids, and sulfur. Drug: [name]  reports no history of drug use. Alcohol:  reports no history of alcohol use. Tobacco:  reports that she has never smoked. She has never used smokeless tobacco. Social: [name]  reports that she has never smoked. She has never used smokeless tobacco. She reports that she does not drink alcohol and does not use drugs. Medical:  has a past medical history of Anxiety, Arthritis, CAD (coronary artery disease) (10/08/2021), CHF (congestive heart failure) (HCC) (08/27/2021), Chicken pox, Chronic kidney disease, Chronic pain syndrome, Chronic, continuous use of opioids, Complication of anesthesia, DDD (degenerative disc disease), cervical, Depression, Gastric ulcer, GERD (gastroesophageal reflux disease), History of bilateral  cataract extraction, History of hiatal hernia, HLD (hyperlipidemia), HTN (hypertension), Hypothyroidism, Measles, Mumps, Osteoporosis, Rheumatoid arthritis (HCC), RLS (restless legs syndrome), Rosacea, Sjogren's disease (HCC), SLE (systemic lupus erythematosus) (HCC), and Therapeutic opioid-induced constipation (OIC). Surgical: [name]  has a past surgical history that includes Cholesteatoma excision (Right); Colonoscopy; Breast enhancement surgery (2004); Cholecystectomy; Laparoscopic incisional / umbilical / ventral hernia repair (2007); Total laparoscopic hysterectomy with bilateral salpingo oophorectomy (Bilateral, 10/24/2020); Cystocele repair (N/A, 10/24/2020); Upper gastrointestinal endoscopy; Cataract extraction (Bilateral); Combined augmentation mammaplasty and abdominoplasty (1978); Limb sparing resection hip w/ saddle joint replacement (2008); Nasal endoscopy (2009); Endovenous ablation saphenous vein w/ laser (N/A); Tubal ligation (Bilateral, 1975); Knee arthroscopy (Right, 2010); Knee Arthroplasty (Left); Hip Arthroplasty (Left); and Total hip arthroplasty (Right, 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 Family: family history is not on file.  Laboratory Chemistry Profile   Renal Lab Results  Component Value Date   BUN 30 (H) 05/12/2023   CREATININE 1.20 (H) 05/12/2023   GFRNONAA 48 (L) 05/12/2023  Hepatic Lab Results  Component Value Date   AST 21 04/30/2023   ALT 20 04/30/2023   ALBUMIN 3.7 04/30/2023   ALKPHOS 66 04/30/2023   LIPASE 28 09/06/2020    Electrolytes Lab Results  Component Value Date   NA 132 (L) 05/12/2023   K 4.5 05/12/2023   CL 97 (L) 05/12/2023   CALCIUM 7.4 (L) 05/12/2023    Bone No results found for: "VD25OH", "VD125OH2TOT", "UE4540JW1", "XB1478GN5", "25OHVITD1", "25OHVITD2", "25OHVITD3", "TESTOFREE", "TESTOSTERONE"  Inflammation (CRP: Acute Phase) (ESR: Chronic Phase) No results found for: "CRP", "ESRSEDRATE", "LATICACIDVEN"       Note: Above Lab results reviewed.  Recent  Imaging Review  DG HIP UNILAT W OR W/O PELVIS 2-3 VIEWS RIGHT CLINICAL DATA:  Status post total right hip arthroplasty.  EXAM: DG HIP (WITH OR WITHOUT PELVIS) 2-3V RIGHT  COMPARISON:  Right hip radiographs 11/13/2022  FINDINGS: Interval total right hip arthroplasty. Redemonstration of prior total left hip arthroplasty. No perihardware lucency is seen to indicate hardware failure or loosening. Expected postoperative changes including lateral right hip subcutaneous air and lateral surgical skin staples.  Mild subchondral sclerosis within the visualized inferior sacroiliac joints. No acute fracture or dislocation.  IMPRESSION: Status post total right hip arthroplasty without evidence of hardware failure.  Electronically Signed   By: Neita Garnet M.D.   On: 05/11/2023 13:55  Left shoulder x-ray: Narrowing of the glenohumeral joint along with osteophytes moderate osteoarthritis Left knee x-ray: Medial joint space narrowing with degenerative changes notable.  Moderate left knee osteoarthritis.  Note: Reviewed        Physical Exam  General appearance: Well nourished, well developed, and well hydrated. In no apparent acute distress Mental status: Alert, oriented x 3 (person, place, & time)       Respiratory: No evidence of acute respiratory distress Eyes: PERLA Vitals: BP (!) 157/72 (BP Location: Right Arm, Patient Position: Sitting, Cuff Size: Normal)   Pulse 66   Temp 98.4 F (36.9 C) (Temporal)   Resp 16   Ht 5\' 2"  (1.575 m)   Wt 236 lb (107 kg)   SpO2 100%   BMI 43.16 kg/m  BMI: Estimated body mass index is 43.16 kg/m as calculated from the following:   Height as of this encounter: 5\' 2"  (1.575 m).   Weight as of this encounter: 236 lb (107 kg). Ideal: Ideal body weight: 50.1 kg (110 lb 7.2 oz) Adjusted ideal body weight: 72.9 kg (160 lb 10.7 oz)  Upper Extremity (UE) Exam      Side: Right upper extremity   Side: Left upper extremity  Skin & Extremity Inspection:  Skin color, temperature, and hair growth are WNL. No peripheral edema or cyanosis. No masses, redness, swelling, asymmetry, or associated skin lesions. No contractures.   Skin & Extremity Inspection: Skin color, temperature, and hair growth are WNL. No peripheral edema or cyanosis. No masses, redness, swelling, asymmetry, or associated skin lesions. No contractures.  Functional ROM: Pain restricted ROM for shoulder and elbow   Functional ROM: Pain restricted ROM for shoulder and elbow  Muscle Tone/Strength: Functionally intact. No obvious neuro-muscular anomalies detected.   Muscle Tone/Strength: Functionally intact. No obvious neuro-muscular anomalies detected.  Sensory (Neurological): Arthropathic arthralgia           Sensory (Neurological): Arthropathic arthralgia        and possibly neurogenic  Palpation: No palpable anomalies               Palpation: No palpable anomalies  Provocative Test(s):  Phalen's test: deferred Tinel's test: deferred Apley's scratch test (touch opposite shoulder):  Action 1 (Across chest): Decreased ROM Action 2 (Overhead): Decreased ROM Action 3 (LB reach): Decreased ROM     Provocative Test(s):  Phalen's test: deferred Tinel's test: deferred Apley's scratch test (touch opposite shoulder):  Action 1 (Across chest): Decreased ROM Action 2 (Overhead): Decreased ROM Action 3 (LB reach): Decreased ROM     Left knee pain, worse with weightbearing; arthropathic arthralgia  Assessment   Diagnosis Status  1. 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 Primary osteoarthritis of left knee   3. Chronic pain of both shoulders   4. History of left hip replacement   5. Primary osteoarthritis of right hip   6. Rheumatoid arthritis involving multiple sites, unspecified whether rheumatoid factor present (HCC)   7. Pain management contract signed   8. Chronic pain syndrome    Having a Flare-up Having a Flare-up Having a Flare-up   Updated  Problems: No problems updated.  Plan of Care  Problem-specific:  Assessment and Plan    Left shoulder pain due to left shoulder glenohumeral arthritis She experiences a recurrence of left shoulder pain, similar to previous episodes managed with injections. The pain is increasing and early intervention is advised to prevent severe pain. Schedule a left shoulder injection for next month.  Left knee pain and instability due to left knee osteoarthritis She has intermittent sharp pain and stiffness in the left knee, possibly related to recent right hip replacement surgery. Knee instability may benefit from gel injections to improve cartilage health and reduce pain. Gel injections do not increase blood sugar and can help build cartilage. Her previous knee issues include surgery for a torn meniscus and stage four arthritis in the right knee. Obtain approval for a gel injection in the left knee and schedule it for next month.  Chronic pain management   She is on hydrocodone for chronic pain management, with the next refill due on March 25th. She is no longer on Eliquis or tramadol, which were used postoperatively for hip surgery. Refill hydrocodone on March 25th.  Rheumatoid arthritis   She is on Plaquenil for rheumatoid arthritis management. Continue Plaquenil as prescribed.  Osteoporosis   She is on Prolia for osteoporosis management. Recent feedback from the surgeon indicates good bone health for her age, suggesting the treatment is effective. Continue Prolia as prescribed.       [name] has a current medication list which includes the following long-term medication(s): abatacept, bupropion, cetirizine, estradiol, fluticasone, levothyroxine, losartan, metoprolol succinate, pantoprazole, torsemide, and venlafaxine xr.  Pharmacotherapy (Medications Ordered): Meds ordered this encounter  Medications   HYDROcodone-acetaminophen (NORCO) 7.5-325 MG tablet    Sig: Take 1 tablet by mouth 3  (three) times daily as needed for severe pain (pain score 7-10). Must last 30 days    Dispense:  90 tablet    Refill:  0    Chronic Pain: STOP Act (Not applicable) Fill 1 day early if closed on refill date. Avoid benzodiazepines within 8 hours of opioids   HYDROcodone-acetaminophen (NORCO) 7.5-325 MG tablet    Sig: Take 1 tablet by mouth 3 (three) times daily as needed for severe pain (pain score 7-10). Must last 30 days    Dispense:  90 tablet    Refill:  0    Chronic Pain: STOP Act (Not applicable) Fill 1 day early if closed on refill date. Avoid benzodiazepines within 8 hours of opioids  HYDROcodone-acetaminophen (NORCO) 7.5-325 MG tablet    Sig: Take 1 tablet by mouth 3 (three) times daily as needed for severe pain (pain score 7-10). Must last 30 days    Dispense:  90 tablet    Refill:  0    Chronic Pain: STOP Act (Not applicable) Fill 1 day early if closed on refill date. Avoid benzodiazepines within 8 hours of opioids   Orders:  Orders Placed This Encounter  Procedures   SHOULDER INJECTION    Standing Status:   Future    Expected Date:   03/01/2024    Expiration Date:   04/26/2024    Scheduling Instructions:     Procedure: Intra-articular shoulder (Glenohumeral) joint injection     Side: LEFT     Level: Glenohumeral joint               Sedation: PO Valium     Timeframe: As permitted by the schedule    Where will this procedure be performed?:   [facility]   KNEE INJECTION    Indications: Knee arthralgia (pain) due to osteoarthritis (OA) Imaging: None (CPT-20610) Position: Sitting Equipment/Materials: Block tray  1.5", 25-G (one per side)  Local anesthetic  Monovisc (one per side) Confirm availability (in office) of Monovisc (HMW hyaluronan)    Standing Status:   Future    Expected Date:   02/23/2024    Expiration Date:   01/24/2025    Scheduling Instructions:     Procedure: Knee injection Monovisc (Hyaluronan/Hyaluronic acid)     Treatment No.:  1           Level: Intra-articular     Laterality: Bilateral Knee     Sedation: Patient's choice.    Where will this procedure be performed?:   [facility]   Follow-up plan:   Return in about 27 days (around 02/21/2024) for left knee hyalgan and left shoulder injection , in clinic (PO Valium 5mg ).      Recent Visits No visits were found meeting these conditions. Showing recent visits within past 90 days and meeting all other requirements Today's Visits Date Type Provider Dept  01/25/24 Office Visit Edward Jolly, MD [facility]  Showing today's visits and meeting all other requirements Future Appointments Date Type Provider Dept  02/21/24 Appointment Edward Jolly, MD [facility]  Showing future appointments within next 90 days and meeting all other requirements  I discussed the assessment and treatment plan with the patient. The patient was provided an opportunity to ask questions and all were answered. The patient agreed with the plan and demonstrated an understanding of the instructions.  Patient advised to call back or seek an in-person evaluation if the symptoms or condition worsens.  Duration of encounter: .  Total time on encounter, as per AMA guidelines included both the face-to-face and non-face-to-face time personally spent by the physician and/or other qualified health care professional(s) on the day of the encounter (includes time in activities that require the physician or other qualified health care professional and does not include time in activities normally performed by clinical staff). Physician's time may include the following activities when performed: Preparing to see the patient (e.g., pre-charting review of records, searching for previously ordered imaging, lab work, and nerve conduction tests) Review of prior analgesic pharmacotherapies. Reviewing PMP Interpreting ordered tests (e.g., lab work, imaging, nerve conduction  tests) Performing post-procedure evaluations, including interpretation of diagnostic procedures Obtaining and/or reviewing separately obtained history Performing a medically appropriate examination and/or evaluation  Counseling and educating the patient/family/caregiver Ordering medications, tests, or procedures Referring and communicating with other health care professionals (when not separately reported) Documenting clinical information in the electronic or other health record Independently interpreting results (not separately reported) and communicating results to the patient/ family/caregiver Care coordination (not separately reported)  Note by: Edward Jolly, MD Date: 01/25/2024; Time: 3:02 PM

## 2024-01-25 NOTE — Patient Instructions (Addendum)
 ______________________________________________________________________    General Risks and Possible Complications  Patient Responsibilities: It is important that you read this as it is part of your informed consent. It is our duty to inform you of the risks and possible complications associated with treatments offered to you. It is your responsibility as a patient to read this and to ask questions about anything that is not clear or that you believe was not covered in this document.  Patient's Rights: You have the right to refuse treatment. You also have the right to change your mind, even after initially having agreed to have the treatment done. However, under this last option, if you wait until the last second to change your mind, you may be charged for the materials used up to that point.  Introduction: Medicine is not an Visual merchandiser. Everything in Medicine, including the lack of treatment(s), carries the potential for danger, harm, or loss (which is by definition: Risk). In Medicine, a complication is a secondary problem, condition, or disease that can aggravate an already existing one. All treatments carry the risk of possible complications. The fact that a side effects or complications occurs, does not imply that the treatment was conducted incorrectly. It must be clearly understood that these can happen even when everything is done following the highest safety standards.  No treatment: You can choose not to proceed with the proposed treatment alternative. The "PRO(s)" would include: avoiding the risk of complications associated with the therapy. The "CON(s)" would include: not getting any of the treatment benefits. These benefits fall under one of three categories: diagnostic; therapeutic; and/or palliative. Diagnostic benefits include: getting information which can ultimately lead to improvement of the disease or symptom(s). Therapeutic benefits are those associated with the successful  treatment of the disease. Finally, palliative benefits are those related to the decrease of the primary symptoms, without necessarily curing the condition (example: decreasing the pain from a flare-up of a chronic condition, such as incurable terminal cancer).  General Risks and Complications: These are associated to most interventional treatments. They can occur alone, or in combination. They fall under one of the following six (6) categories: no benefit or worsening of symptoms; bleeding; infection; nerve damage; allergic reactions; and/or death. No benefits or worsening of symptoms: In Medicine there are no guarantees, only probabilities. No healthcare provider can ever guarantee that a medical treatment will work, they can only state the probability that it may. Furthermore, there is always the possibility that the condition may worsen, either directly, or indirectly, as a consequence of the treatment. Bleeding: This is more common if the patient is taking a blood thinner, either prescription or over the counter (example: Goody Powders, Fish oil, Aspirin, Garlic, etc.), or if suffering a condition associated with impaired coagulation (example: Hemophilia, cirrhosis of the liver, low platelet counts, etc.). However, even if you do not have one on these, it can still happen. If you have any of these conditions, or take one of these drugs, make sure to notify your treating physician. Infection: This is more common in patients with a compromised immune system, either due to disease (example: diabetes, cancer, human immunodeficiency virus [HIV], etc.), or due to medications or treatments (example: therapies used to treat cancer and rheumatological diseases). However, even if you do not have one on these, it can still happen. If you have any of these conditions, or take one of these drugs, make sure to notify your treating physician. Nerve Damage: This is more common when the treatment is  an invasive one, but it  can also happen with the use of medications, such as those used in the treatment of cancer. The damage can occur to small secondary nerves, or to large primary ones, such as those in the spinal cord and brain. This damage may be temporary or permanent and it may lead to impairments that can range from temporary numbness to permanent paralysis and/or brain death. Allergic Reactions: Any time a substance or material comes in contact with our body, there is the possibility of an allergic reaction. These can range from a mild skin rash (contact dermatitis) to a severe systemic reaction (anaphylactic reaction), which can result in death. Death: In general, any medical intervention can result in death, most of the time due to an unforeseen complication. ______________________________________________________________________     Knee Injection A knee injection is a procedure to get medicine in your knee joint to help relieve the pain, swelling, and stiffness that is caused by arthritis. The medicine may also cushion your knee joint. Your health care provider will use a needle to inject the medicine. You may need more than one injection. Tell a health care provider about: Any allergies you have. All medicines you take. These include vitamins, herbs, eye drops, and creams. Any problems you or family members have had with anesthesia. Any bleeding problems you have. Any surgeries you've had. Any medical conditions you have. Whether you're pregnant or may be pregnant. What are the risks? Your provider will talk with you about risks. These may include: Infection. Bleeding. Symptoms that get worse. Damage to the area around your knee. Allergic reaction to the medicines. Skin reactions from having many injections. What happens before the procedure? Ask about changing or stopping: Any medicines you take. Any vitamins, herbs, or supplements you take. Do not take aspirin or ibuprofen unless you're told  to. Plan to have a responsible adult drive you home from the hospital or clinic. You won't be allowed to drive. What happens during the procedure?  You will sit or lie down in a position for your knee to be treated. The skin over your kneecap will be cleaned with a soap that kills germs. You will be given a medicine that numbs the area. You may feel some stinging. The medicine will be injected into your knee. The needle is placed between your kneecap and your knee. The medicine is injected into the joint space. The needle will be taken out. A bandage may be placed over the injection site. The procedure may vary among providers and hospitals. What can I expect after the procedure? Your blood pressure, heart rate, breathing rate, and blood oxygen level will be monitored until you leave the hospital or clinic. You may have to move your knee through its full range of motion. This helps to get the medicine into the joint space. You will be watched to make sure that you do not have a reaction to the injection. You may feel more pain, swelling, and warmth than you did before the injection. This reaction may last about 1-2 days. Follow these instructions at home: Medicines Take over-the-counter and prescription medicines only as told by your provider. Ask your provider if the medicine prescribed to you requires you to avoid driving or using machinery. Do not take medicines such as aspirin and ibuprofen unless your provider tells you to. Injection site care Follow instructions from your provider about: How to take care of your injection site. When and how you should change your bandage. When  you should take off your bandage. Check your injection area every day for signs of infection. Check for: More redness, swelling, or pain after 2 days. Fluid or blood. Warmth. Pus or a bad smell. Managing pain, stiffness, and swelling  Use ice or an ice pack as told. Place a towel between your skin and  the ice. Leave the ice on for 20 minutes, 2-3 times a day. If your skin turns red, take off the ice right away to prevent skin damage. The risk of damage is higher if you can't feel pain, heat, or cold. Do not put heat to your knee. Raise your knee above the level of your heart while you're sitting or lying down. General instructions If you have a bandage, keep it dry until your provider says it can be taken off. Ask your provider when you can start taking showers and baths. Avoid activities that take a lot of effort for as long as told by your provider. Ask what things are safe for you to do at home. Ask when you can go back to work or school. Keep all follow-up visits. You may need more injections. Contact a health care provider if: You have a fever. You have any signs of infection. Your symptoms at the injection site last longer than 2 days after your procedure. Get help right away if: Your knee turns very red. Your knee becomes very swollen. You have very bad knee pain. This information is not intended to replace advice given to you by your health care provider. Make sure you discuss any questions you have with your health care provider. Document Revised: 02/23/2023 Document Reviewed: 02/23/2023 [employer] Patient Education  2024 [employer] ______________________________________________________________________    Preparing for your procedure  Appointments: If you think you may not be able to keep your appointment, call 24-48 hours in advance to cancel. We need time to make it available to others.  Procedure visits are for procedures only. During your procedure appointment there will be: NO Prescription Refills*. NO medication changes or discussions*. NO discussion of disability issues*. NO unrelated pain problem evaluations*. NO evaluations to order other pain procedures*. *These will be addressed at a separate and distinct evaluation encounter on the provider's evaluation  schedule and not during procedure days.  Instructions: Food intake: Avoid eating anything solid for at least 8 hours prior to your procedure. Clear liquid intake: You may take clear liquids such as water up to 2 hours prior to your procedure. (No carbonated drinks. No soda.) Transportation: Unless otherwise stated by your physician, bring a driver. (Driver cannot be a Market researcher, Pharmacist, community, or any other form of public transportation.) Morning Medicines: Except for blood thinners, take all of your other morning medications with a sip of water. Make sure to take your heart and blood pressure medicines. If your blood pressure's lower number is above 100, the case will be rescheduled. Blood thinners: Make sure to stop your blood thinners as instructed.  If you take a blood thinner, but were not instructed to stop it, call our office [phone] and ask to talk to a nurse. Not stopping a blood thinner prior to certain procedures could lead to serious complications. Diabetics on insulin: Notify the staff so that you can be scheduled 1st case in the morning. If your diabetes requires high dose insulin, take only  of your normal insulin dose the morning of the procedure and notify the staff that you have done so. Preventing infections: Shower with an antibacterial soap the  morning of your procedure.  Build-up your immune system: Take 1000 mg of Vitamin C with every meal (3 times a day) the day prior to your procedure. Antibiotics: Inform the nursing staff if you are taking any antibiotics or if you have any conditions that may require antibiotics prior to procedures. (Example: recent joint implants)   Pregnancy: If you are pregnant make sure to notify the nursing staff. Not doing so may result in injury to the fetus, including death.  Sickness: If you have a cold, fever, or any active infections, call and cancel or reschedule your procedure. Receiving steroids while having an infection may result in  complications. Arrival: You must be in the facility at least 30 minutes prior to your scheduled procedure. Tardiness: Your scheduled time is also the cutoff time. If you do not arrive at least 15 minutes prior to your procedure, you will be rescheduled.  Children: Do not bring any children with you. Make arrangements to keep them home. Dress appropriately: There is always a possibility that your clothing may get soiled. Avoid long dresses. Valuables: Do not bring any jewelry or valuables.  Reasons to call and reschedule or cancel your procedure: (Following these recommendations will minimize the risk of a serious complication.) Surgeries: Avoid having procedures within 2 weeks of any surgery. (Avoid for 2 weeks before or after any surgery). Flu Shots: Avoid having procedures within 2 weeks of a flu shots or . (Avoid for 2 weeks before or after immunizations). Barium: Avoid having a procedure within 7-10 days after having had a radiological study involving the use of radiological contrast. (Myelograms, Barium swallow or enema study). Heart attacks: Avoid any elective procedures or surgeries for the initial 6 months after a "Myocardial Infarction" (Heart Attack). Blood thinners: It is imperative that you stop these medications before procedures. Let us know if you if you take any blood thinner.  Infection: Avoid procedures during or within two weeks of an infection (including chest colds or gastrointestinal problems). Symptoms associated with infections include: Localized redness, fever, chills, night sweats or profuse sweating, burning sensation when voiding, cough, congestion, stuffiness, runny nose, sore throat, diarrhea, nausea, vomiting, cold or Flu symptoms, recent or current infections. It is specially important if the infection is over the area that we intend to treat. Heart and lung problems: Symptoms that may suggest an active cardiopulmonary problem include: cough, chest pain, breathing  difficulties or shortness of breath, dizziness, ankle swelling, uncontrolled high or unusually low blood pressure, and/or palpitations. If you are experiencing any of these symptoms, cancel your procedure and contact your primary care physician for an evaluation.  Remember:  Regular Business hours are:  Monday to Thursday 8:00 AM to 4:00 PM  Provider's Schedule: Delano Metz, MD:  Procedure days: Tuesday and Thursday 7:30 AM to 4:00 PM  Edward Jolly, MD:  Procedure days: Monday and Wednesday 7:30 AM to 4:00 PM Last  Updated: 10/19/2023 ______________________________________________________________________

## 2024-02-21 ENCOUNTER — Ambulatory Visit
Admission: RE | Admit: 2024-02-21 | Discharge: 2024-02-21 | Disposition: A | Discharge status: Home / Self Care | Source: Ambulatory Visit | Attending: Student in an Organized Health Care Education/Training Program | Admitting: Student in an Organized Health Care Education/Training Program

## 2024-02-21 ENCOUNTER — Encounter: Payer: Self-pay | Admitting: Student in an Organized Health Care Education/Training Program

## 2024-02-21 ENCOUNTER — Ambulatory Visit
Attending: Student in an Organized Health Care Education/Training Program | Admitting: Student in an Organized Health Care Education/Training Program

## 2024-02-21 VITALS — BP 115/45 | HR 72 | Temp 98.4°F | Resp 18 | Ht 62.0 in | Wt 236.0 lb

## 2024-02-21 DIAGNOSIS — M1712 Unilateral primary osteoarthritis, left knee: Secondary | ICD-10-CM | POA: Diagnosis not present

## 2024-02-21 DIAGNOSIS — G8929 Other chronic pain: Secondary | ICD-10-CM | POA: Diagnosis present

## 2024-02-21 DIAGNOSIS — M19012 Primary osteoarthritis, left shoulder: Secondary | ICD-10-CM | POA: Insufficient documentation

## 2024-02-21 DIAGNOSIS — M25511 Pain in right shoulder: Secondary | ICD-10-CM | POA: Insufficient documentation

## 2024-02-21 DIAGNOSIS — M19011 Primary osteoarthritis, right shoulder: Secondary | ICD-10-CM | POA: Insufficient documentation

## 2024-02-21 DIAGNOSIS — M25512 Pain in left shoulder: Secondary | ICD-10-CM | POA: Insufficient documentation

## 2024-02-21 MED ORDER — METHYLPREDNISOLONE ACETATE 40 MG/ML IJ SUSP
40.0000 mg | Freq: Once | INTRAMUSCULAR | Status: AC
  Administered 2024-02-21: 40 mg via INTRA_ARTICULAR
  Filled 2024-02-21: qty 1

## 2024-02-21 MED ORDER — LIDOCAINE HCL 2 % IJ SOLN
20.0000 mL | Freq: Once | INTRAMUSCULAR | Status: AC
  Administered 2024-02-21: 100 mg

## 2024-02-21 MED ORDER — LIDOCAINE HCL (PF) 2 % IJ SOLN
INTRAMUSCULAR | Status: AC
  Filled 2024-02-21: qty 5

## 2024-02-21 MED ORDER — SODIUM HYALURONATE (VISCOSUP) 20 MG/2ML IX SOSY
2.0000 mL | PREFILLED_SYRINGE | Freq: Once | INTRA_ARTICULAR | Status: AC
  Administered 2024-02-21: 20 mg via INTRA_ARTICULAR

## 2024-02-21 MED ORDER — ROPIVACAINE HCL 2 MG/ML IJ SOLN
4.0000 mL | Freq: Once | INTRAMUSCULAR | Status: AC
  Administered 2024-02-21: 4 mL via INTRA_ARTICULAR
  Filled 2024-02-21: qty 20

## 2024-02-21 MED ORDER — IOHEXOL 180 MG/ML  SOLN
10.0000 mL | Freq: Once | INTRAMUSCULAR | Status: AC
  Administered 2024-02-21: 10 mL via INTRA_ARTICULAR
  Filled 2024-02-21: qty 20

## 2024-02-21 NOTE — Progress Notes (Signed)
 Safety precautions to be maintained throughout the outpatient stay will include: orient to surroundings, keep bed in low position, maintain call bell within reach at all times, provide assistance with transfer out of bed and ambulation.

## 2024-02-21 NOTE — Progress Notes (Signed)
 PROVIDER NOTE: Interpretation of information contained herein should be left to medically-trained personnel. Specific patient instructions are provided elsewhere under "Patient Instructions" section of medical record. This document was created in part using STT-dictation technology, any transcriptional errors that may result from this process are unintentional.  Patient: Candace Juarez Type: Established DOB: [birth year] MRN: [id number] PCP: Enid Baas, MD  Service: Procedure DOS: 02/21/2024 Setting: Ambulatory Location: Ambulatory outpatient facility Delivery: Face-to-face Provider: Edward Jolly, MD Specialty: Interventional Pain Management Specialty designation: 09 Location: Outpatient facility Ref. 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  Risk Assessment: Vitals:  [id number] body mass index is 43.16 kg/m as calculated from the following:   Height as of this encounter: 5\' 2"  (1.575 m).   Weight as of this encounter: 236 lb (107 kg)., Rate:72ECG Heart Rate: 75, BP:(!) 141/51, Resp:16, Temp:98.4 F (36.9 C), SpO2:100 %  Allergies: She is allergic to rifampin, sulfa antibiotics, cephalexin, nsaids, and sulfur.  Precautions: No additional precautions required  Blood-thinner(s): None at this time  Coagulopathies: Reviewed. None identified.   Active Infection(s): Reviewed. None identified. Candace Juarez is afebrile   Location setting: Exam room Position: Sitting w/ knee bent 90  degrees Safety Precautions: Patient was assessed for positional comfort and pressure points before starting the procedure. Prepping solution: DuraPrep (Iodine Povacrylex [0.7% available iodine] and Isopropyl Alcohol, 74% w/w) Prep Area: Entire knee region Approach: percutaneous, just above the tibial plateau, lateral to the infrapatellar tendon. Intended target: Intra-articular knee space Materials: Tray: Block Needle(s): Regular Qty: 1/side Length: 1.5-inch Gauge: 25G (x1) + 22G (x1)  Meds ordered this encounter  Medications   iohexol (OMNIPAQUE) 180 MG/ML injection 10 mL    Must be Myelogram-compatible. If not available, you may substitute with a water-soluble, non-ionic, hypoallergenic, myelogram-compatible radiological contrast medium.   lidocaine (XYLOCAINE) 2 % (with pres) injection 400 mg   methylPREDNISolone acetate (DEPO-MEDROL) injection 40 mg   ropivacaine (PF) 2 mg/mL (0.2%) (NAROPIN) injection 4 mL   Sodium Hyaluronate (Viscosup) SOSY 20 mg    Do not substitute. Deliver to facility day before procedure.    Orders Placed This Encounter  Procedures   DG PAIN CLINIC C-ARM 1-60 MIN NO REPORT    Intraoperative interpretation by procedural physician at [facility].    Standing Status:   Standing    Number of Occurrences:   1    Reason for exam::   Assistance in needle guidance and placement for procedures requiring needle placement in or near specific anatomical locations not easily accessible without such assistance.     Time-out: 1412 I initiated and conducted the "Time-out" before starting the procedure, as per protocol. The patient was asked to participate by confirming the accuracy of the "Time Out" information. Verification of the correct person, site, and procedure were performed and confirmed by me, the nursing staff, and the patient. "Time-out" conducted as per Joint Commission's Universal Protocol (UP.01.01.01). Procedure checklist: Completed  H&P (Pre-op   Assessment)  Candace Juarez is a 73 y.o. (year old), female patient, seen today for interventional treatment. She  has a past surgical history that includes Cholesteatoma excision (Right); Colonoscopy; Breast enhancement surgery (2004); Cholecystectomy; Laparoscopic incisional / umbilical / ventral hernia repair (2007); Total laparoscopic hysterectomy with bilateral salpingo oophorectomy (Bilateral, 10/24/2020); Cystocele repair (N/A, 10/24/2020); Upper gastrointestinal endoscopy; Cataract extraction (Bilateral); Combined augmentation mammaplasty and abdominoplasty (1978); Limb sparing resection hip w/ saddle joint replacement (2008); Nasal endoscopy (2009); Endovenous ablation saphenous vein w/ laser (N/A); Tubal ligation (Bilateral, 1975); Knee arthroscopy (Right, 2010); Knee Arthroplasty (Left); Hip Arthroplasty (Left); and Total hip arthroplasty (Right, 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 Candace Juarez has a current medication list which includes the following prescription(s): abatacept, acetaminophen, alprazolam, bupropion, cetirizine, cyclosporine, denosumab, diclofenac sodium, diphenoxylate-atropine, estradiol, fluticasone, hydrocodone-acetaminophen, [START ON 03/02/2024] hydrocodone-acetaminophen, [START ON 04/01/2024] hydrocodone-acetaminophen, hydroxychloroquine, levothyroxine, losartan, metoprolol succinate, metronidazole, mupirocin 2% oint-hydrocortisone 2.5% cream-nystatin cream-zinc oxide 13% oint 1:1:1:5 mixture, ondansetron, pantoprazole, polyethyl glycol-propyl glycol, pseudoephedrine, simethicone, torsemide, and venlafaxine xr. Her primarily concern today is the Knee Pain (Left ) and Shoulder Pain (Left )  She is allergic to rifampin, sulfa antibiotics, cephalexin, nsaids, and sulfur.   Last encounter: My last encounter with her was on 01/25/2024. Pertinent problems: Candace Juarez has Polyarthralgia; Status post right hip replacement; H/O right knee surgery; Chronic pain of both knees; Chronic right hip pain; Chronic pain syndrome; and  Pain management contract signed on their pertinent problem list. Pain Assessment: Severity of Chronic pain is reported as a 2 /10. Location: Knee (left shoulder) Left/denies. Onset: More than a month ago. Quality: Aching, Sharp, Sore, Discomfort, Constant, Other (Comment) (knee instability). Timing: Constant. Modifying factor(s): moving, meds, heat/warm showers, warmer weather. Vitals:  height is 5\' 2"  (1.575 m) and weight is 236 lb (107 kg). Her temporal temperature is 98.4 F (36.9 C). Her blood pressure is 115/45 (abnormal) and her pulse is 72. Her respiration is 18 and oxygen saturation is 100%.   Reason for encounter: "interventional pain management therapy due pain of at least four (4) weeks in duration, with failure to respond and/or inability to tolerate more conservative care.  Site Confirmation: Ms. Pilant was asked to confirm the procedure and laterality before marking the site.  Consent: Before the procedure and under the influence of no sedative(s), amnesic(s), or anxiolytics, the patient was informed of the treatment options, risks and possible complications. To fulfill our ethical and legal obligations, as recommended by the American Medical Association's Code of Ethics, I have informed the patient of my clinical impression; the nature and purpose of the treatment or procedure; the risks, benefits, and possible complications of the intervention; the alternatives, including doing nothing; the risk(s) and benefit(s) of the alternative treatment(s) or procedure(s); and the risk(s) and benefit(s) of doing nothing. The patient was provided information about the general risks and possible complications associated with the procedure. These may include, but are not limited to: failure to achieve desired goals, infection, bleeding, organ or nerve damage, allergic reactions, paralysis, and death. In addition, the patient was informed of those risks and complications associated to Spine-related  procedures, such as failure to decrease pain; infection (i.e.: Meningitis, epidural or intraspinal abscess); bleeding (i.e.: epidural hematoma, subarachnoid hemorrhage, or any other type of intraspinal or peri-dural bleeding); organ or nerve damage (i.e.: Any type of peripheral nerve, nerve root, or spinal cord injury) with subsequent damage to sensory, motor, and/or autonomic systems, resulting in permanent pain, numbness, and/or weakness of one or several areas of the body; allergic reactions; (i.e.: anaphylactic reaction); and/or death. Furthermore, the patient was informed of those risks and complications associated with the medications. These  include, but are not limited to: allergic reactions (i.e.: anaphylactic or anaphylactoid reaction(s)); adrenal axis suppression; blood sugar elevation that in diabetics may result in ketoacidosis or comma; water retention that in patients with history of congestive heart failure may result in shortness of breath, pulmonary edema, and decompensation with resultant heart failure; weight gain; swelling or edema; medication-induced neural toxicity; particulate matter embolism and blood vessel occlusion with resultant organ, and/or nervous system infarction; and/or aseptic necrosis of one or more joints. Finally, the patient was informed that Medicine is not an exact science; therefore, there is also the possibility of unforeseen or unpredictable risks and/or possible complications that may result in a catastrophic outcome. The patient indicated having understood very clearly. We have given the patient no guarantees and we have made no promises. Enough time was given to the patient to ask questions, all of which were answered to the patient's satisfaction. Ms. Vanacker has indicated that she wanted to continue with the procedure. Attestation: I, the ordering provider, attest that I have discussed with the patient the benefits, risks, side-effects, alternatives, likelihood of  achieving goals, and potential problems during recovery for the procedure that I have provided informed consent.  Date  Time: 02/21/2024  1:31 PM  Description of procedure  Start Time: 1412 hrs  Local Anesthesia: Once the patient was positioned, prepped, and time-out was completed. The target area was identified located. The skin was marked with an approved surgical skin marker. Once marked, the skin (epidermis, dermis, and hypodermis), and deeper tissues (fat, connective tissue and muscle) were infiltrated with a small amount of a short-acting local anesthetic, loaded on a 10cc syringe with a 25G, 1.5-in  Needle. An appropriate amount of time was allowed for local anesthetics to take effect before proceeding to the next step. Local Anesthetic: Lidocaine 1-2% The unused portion of the local anesthetic was discarded in the proper designated containers. Safety Precautions: Aspiration looking for blood return was conducted prior to all injections. At no point did I inject any substances, as a needle was being advanced. Before injecting, the patient was told to immediately notify me if she was experiencing any new onset of "ringing in the ears, or metallic taste in the mouth". No attempts were made at seeking any paresthesias. Safe injection practices and needle disposal techniques used. Medications properly checked for expiration dates. SDV (single dose vial) medications used. After the completion of the procedure, all disposable equipment used was discarded in the proper designated medical waste containers.  Technical description: Protocol guidelines were followed. After positioning, the target area was identified and prepped in the usual manner. Skin & deeper tissues infiltrated with local anesthetic. Appropriate amount of time allowed to pass for local anesthetics to take effect. Proper needle placement secured. Once satisfactory needle placement was confirmed, I proceeded to inject the desired solution  in slow, incremental fashion, intermittently assessing for discomfort or any signs of abnormal or undesired spread of substance. Once completed, the needle was removed and disposed of, as per hospital protocols. The area was cleaned, making sure to leave some of the prepping solution back to take advantage of its long term bactericidal properties.  Aspiration:  Negative  2 mL of Hyalgan injected into left knee        Vitals:   02/21/24 1333 02/21/24 1409 02/21/24 1414 02/21/24 1417  BP: (!) 141/51 (!) 115/46 (!) 115/52 (!) 115/45  Pulse: 72     Resp: 16 18 16 18   Temp: 98.4 F (36.9 C)  TempSrc: Temporal     SpO2: 100% 100% 100% 100%  Weight: 236 lb (107 kg)     Height: 5\' 2"  (1.575 m)       End Time: 1415 hrs   Post-op assessment  Post-procedure Vital Signs:  Pulse/HCG Rate: 7272 Temp: 98.4 F (36.9 C) Resp: 18 BP: (!) 115/45 SpO2: 100 %  EBL: None  Complications: No immediate post-treatment complications observed by team, or reported by patient.  Note: The patient tolerated the entire procedure well. A repeat set of vitals were taken after the procedure and the patient was kept under observation following institutional policy, for this type of procedure. Post-procedural neurological assessment was performed, showing return to baseline, prior to discharge. The patient was provided with post-procedure discharge instructions, including a section on how to identify potential problems. Should any problems arise concerning this procedure, the patient was given instructions to immediately contact us , at any time, without hesitation. In any case, we plan to contact the patient by telephone for a follow-up status report regarding this interventional procedure.  Comments:  No additional relevant information.  Plan of care  Chronic Opioid Analgesic:  Norco 7.5 mg 3 times daily as needed, quantity 90/month; MME equals 22.5     Medications administered: We administered  iohexol, lidocaine, methylPREDNISolone acetate, ropivacaine (PF) 2 mg/mL (0.2%), and Sodium Hyaluronate (Viscosup).  Follow-up plan:   Return in about 8 weeks (around 04/17/2024) for VV PPE.         Recent Visits Date Type Provider Dept  01/25/24 Office Visit Cephus Collin, MD [facility]  Showing recent visits within past 90 days and meeting all other requirements Today's Visits Date Type Provider Dept  02/21/24 Procedure visit Cephus Collin, MD [facility]  Showing today's visits and meeting all other requirements Future Appointments Date Type Provider Dept  04/17/24 Appointment Cephus Collin, MD [facility]  Showing future appointments within next 90 days and meeting all other requirements   Disposition: Discharge home  Discharge (Date  Time): 02/21/2024; 1430 hrs.   Primary Care Physician: Rex Castor, MD Location: [facility] Note by: Cephus Collin, MD Date: 02/21/2024; Time: 4:31 PM  DISCLAIMER: Medicine is not an exact science. It has no guarantees or warranties. The decision to proceed with this intervention was based on the information collected from the patient. Conclusions were drawn from the patient's questionnaire, interview, and examination. Because information was provided in large part by the patient, it cannot be guaranteed that it has not been purposely or unconsciously manipulated or altered. Every effort has been made to obtain as much accurate, relevant, available data as possible. Always take into account that the treatment will also be dependent on availability of resources and existing treatment guidelines, considered by other Pain Management Specialists as being common knowledge and practice, at the time of the intervention. It is also important to point out that variation in procedural techniques and pharmacological choices are the acceptable norm. For Medico-Legal review purposes, the indications,  contraindications, technique, and results of the these procedures should only be evaluated, judged and interpreted by a Board-Certified Interventional Pain Specialist with extensive familiarity and expertise in the same exact procedure and technique.

## 2024-02-21 NOTE — Progress Notes (Signed)
 PROVIDER NOTE: Information contained herein reflects review and annotations entered in association with encounter. Interpretation of such information and data should be left to medically-trained personnel. Information provided to patient can be located elsewhere in the medical record under "Patient Instructions". Document created using STT-dictation technology, any transcriptional errors that may result from process are unintentional.    Patient: Candace Juarez  Service Category: Procedure  Provider: Cephus Collin, MD  DOB: [birth year]  DOS: 02/21/2024  Location: [facility]  MRN: [id number]  Setting: Ambulatory - outpatient  Referring Provider: Rex Castor, MD  Type: Established Patient  Specialty: Interventional Pain Management  PCP: Rex Castor, MD   Primary Reason for Visit: Interventional Pain Management Treatment. CC: Knee Pain (Left ) and Shoulder Pain (Left )  Procedure:          Anesthesia, Analgesia, Anxiolysis:  Type: Therapeutic Glenohumeral Joint (shoulder) Injection #3  Primary Purpose: Therapeutic Region: Superior Shoulder Area Level:  Shoulder Target Area: Glenohumeral Joint (shoulder) Approach: Anterior approach. Laterality: Left  Type: Local Anesthesia   Local Anesthetic: Lidocaine 1-2%  Position: Supine   Indications: 1. 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 Primary osteoarthritis of left knee   3. Chronic pain of both shoulders     Pain Score: Pre-procedure: 2 /10 Post-procedure: 2/10  Pre-op H&P Assessment:  Candace Juarez is a 73 y.o. (year old), female patient, seen today for interventional treatment. She  has a past surgical history that includes Cholesteatoma excision (Right); Colonoscopy; Breast enhancement surgery (2004); Cholecystectomy; Laparoscopic incisional / umbilical / ventral hernia repair (2007); Total laparoscopic hysterectomy with bilateral salpingo oophorectomy (Bilateral, 10/24/2020); Cystocele repair  (N/A, 10/24/2020); Upper gastrointestinal endoscopy; Cataract extraction (Bilateral); Combined augmentation mammaplasty and abdominoplasty (1978); Limb sparing resection hip w/ saddle joint replacement (2008); Nasal endoscopy (2009); Endovenous ablation saphenous vein w/ laser (N/A); Tubal ligation (Bilateral, 1975); Knee arthroscopy (Right, 2010); Knee Arthroplasty (Left); Hip Arthroplasty (Left); and Total hip arthroplasty (Right, 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 Candace Juarez has a current medication list which includes the following prescription(s): abatacept, acetaminophen, alprazolam, bupropion, cetirizine, cyclosporine, denosumab, diclofenac sodium, diphenoxylate-atropine, estradiol, fluticasone, hydrocodone-acetaminophen, [START ON 03/02/2024] hydrocodone-acetaminophen, [START ON 04/01/2024] hydrocodone-acetaminophen, hydroxychloroquine, levothyroxine, losartan, metoprolol succinate, metronidazole, mupirocin 2% oint-hydrocortisone 2.5% cream-nystatin cream-zinc oxide 13% oint 1:1:1:5 mixture, ondansetron, pantoprazole, polyethyl glycol-propyl glycol, pseudoephedrine, simethicone, torsemide, and venlafaxine xr. Her primarily concern today is the Knee Pain (Left ) and Shoulder Pain (Left )  Initial Vital Signs:  Pulse/HCG Rate: 72ECG Heart Rate: 75 Temp: 98.4 F (36.9 C) Resp: 16 BP: (!) 141/51 SpO2: 100 %  BMI: Estimated body mass index is 43.16 kg/m as calculated from the following:   Height as of this encounter: 5\' 2"  (1.575 m).   Weight as of this encounter: 236 lb (107 kg).  Risk Assessment: Allergies: Reviewed. She is allergic to rifampin, sulfa antibiotics, cephalexin, nsaids, and sulfur.  Allergy Precautions: None required Coagulopathies: Reviewed. None identified.  Blood-thinner therapy: None at this time Active Infection(s): Reviewed. None identified. Candace Juarez is afebrile  Site Confirmation: Candace Juarez was asked to confirm the procedure and laterality before marking the site Procedure checklist:  Completed Consent: Before the procedure and under the influence of no sedative(s), amnesic(s), or anxiolytics, the patient was informed of the treatment options, risks and possible complications. To fulfill our ethical and legal obligations, as recommended by the American Medical Association's Code of Ethics, I have informed the patient of my clinical impression; the nature and purpose of the treatment or procedure; the risks, benefits, and possible  complications of the intervention; the alternatives, including doing nothing; the risk(s) and benefit(s) of the alternative treatment(s) or procedure(s); and the risk(s) and benefit(s) of doing nothing. The patient was provided information about the general risks and possible complications associated with the procedure. These may include, but are not limited to: failure to achieve desired goals, infection, bleeding, organ or nerve damage, allergic reactions, paralysis, and death. In addition, the patient was informed of those risks and complications associated to the procedure, such as failure to decrease pain; infection; bleeding; organ or nerve damage with subsequent damage to sensory, motor, and/or autonomic systems, resulting in permanent pain, numbness, and/or weakness of one or several areas of the body; allergic reactions; (i.e.: anaphylactic reaction); and/or death. Furthermore, the patient was informed of those risks and complications associated with the medications. These include, but are not limited to: allergic reactions (i.e.: anaphylactic or anaphylactoid reaction(s)); adrenal axis suppression; blood sugar elevation that in diabetics may result in ketoacidosis or comma; water retention that in patients with history of congestive heart failure may result in shortness of breath, pulmonary edema, and decompensation with resultant heart failure; weight gain; swelling or edema; medication-induced neural toxicity; particulate matter embolism and blood vessel  occlusion with resultant organ, and/or nervous system infarction; and/or aseptic necrosis of one or more joints. Finally, the patient was informed that Medicine is not an exact science; therefore, there is also the possibility of unforeseen or unpredictable risks and/or possible complications that may result in a catastrophic outcome. The patient indicated having understood very clearly. We have given the patient no guarantees and we have made no promises. Enough time was given to the patient to ask questions, all of which were answered to the patient's satisfaction. Ms. Castrillo has indicated that she wanted to continue with the procedure. Attestation: I, the ordering provider, attest that I have discussed with the patient the benefits, risks, side-effects, alternatives, likelihood of achieving goals, and potential problems during recovery for the procedure that I have provided informed consent. Date  Time: 02/21/2024  1:31 PM  Pre-Procedure Preparation:  Monitoring: As per clinic protocol. Respiration, ETCO2, SpO2, BP, heart rate and rhythm monitor placed and checked for adequate function Safety Precautions: Patient was assessed for positional comfort and pressure points before starting the procedure. Time-out: I initiated and conducted the "Time-out" before starting the procedure, as per protocol. The patient was asked to participate by confirming the accuracy of the "Time Out" information. Verification of the correct person, site, and procedure were performed and confirmed by me, the nursing staff, and the patient. "Time-out" conducted as per Joint Commission's Universal Protocol (UP.01.01.01). Time: 1412  Description of Procedure:          Area Prepped: Entire shoulder Area DuraPrep (Iodine Povacrylex [0.7% available iodine] and Isopropyl Alcohol, 74% w/w) Safety Precautions: Aspiration looking for blood return was conducted prior to all injections. At no point did we inject any substances, as a  needle was being advanced. No attempts were made at seeking any paresthesias. Safe injection practices and needle disposal techniques used. Medications properly checked for expiration dates. SDV (single dose vial) medications used. Description of the Procedure: Protocol guidelines were followed. The patient was placed in position over the procedure table. The target area was identified and the area prepped in the usual manner. Skin & deeper tissues infiltrated with local anesthetic. Appropriate amount of time allowed to pass for local anesthetics to take effect. The procedure needles were then advanced to the target area. Proper needle placement  secured. Negative aspiration confirmed. Solution injected in intermittent fashion, asking for systemic symptoms every 0.5cc of injectate. The needles were then removed and the area cleansed, making sure to leave some of the prepping solution back to take advantage of its long term bactericidal properties.         Vitals:   02/21/24 1333 02/21/24 1409 02/21/24 1414 02/21/24 1417  BP: (!) 141/51 (!) 115/46 (!) 115/52 (!) 115/45  Pulse: 72     Resp: 16 18 16 18   Temp: 98.4 F (36.9 C)     TempSrc: Temporal     SpO2: 100% 100% 100% 100%  Weight: 236 lb (107 kg)     Height: 5\' 2"  (1.575 m)       Start Time: 1412 hrs. End Time: 1415 hrs. Materials:  Needle(s) Type: Spinal Needle Gauge: 22G Length: 3.5-in Medication(s): Please see orders for medications and dosing details. 5cc solution made of 4 cc of 0.2 Ropivacaine and 1 cc of methylprednisolone (40 mg/cc).  5 cc injected in left shoulder Imaging Guidance (Non-Spinal):          Type of Imaging Technique: Fluoroscopy Guidance (Non-Spinal) Indication(s): Assistance in needle guidance and placement for procedures requiring needle placement in or near specific anatomical locations not easily accessible without such assistance. Exposure Time: Please see nurses notes. Contrast: Before injecting any  contrast, we confirmed that the patient did not have an allergy to iodine, shellfish, or radiological contrast. Once satisfactory needle placement was completed at the desired level, radiological contrast was injected. Contrast injected under live fluoroscopy. No contrast complications. See chart for type and volume of contrast used. Fluoroscopic Guidance: I was personally present during the use of fluoroscopy. "Tunnel Vision Technique" used to obtain the best possible view of the target area. Parallax error corrected before commencing the procedure. "Direction-depth-direction" technique used to introduce the needle under continuous pulsed fluoroscopy. Once target was reached, antero-posterior, oblique, and lateral fluoroscopic projection used confirm needle placement in all planes. Images permanently stored in EMR. Interpretation: I personally interpreted the imaging intraoperatively. Adequate needle placement confirmed in multiple planes. Appropriate spread of contrast into desired area was observed. No evidence of afferent or efferent intravascular uptake. Permanent images saved into the patient's record.   Post-operative Assessment:  Post-procedure Vital Signs:  Pulse/HCG Rate: 7272 Temp: 98.4 F (36.9 C) Resp: 18 BP: (!) 115/45 SpO2: 100 %  EBL: None  Complications: No immediate post-treatment complications observed by team, or reported by patient.  Note: The patient tolerated the entire procedure well. A repeat set of vitals were taken after the procedure and the patient was kept under observation following institutional policy, for this type of procedure. Post-procedural neurological assessment was performed, showing return to baseline, prior to discharge. The patient was provided with post-procedure discharge instructions, including a section on how to identify potential problems. Should any problems arise concerning this procedure, the patient was given instructions to immediately contact  us, at any time, without hesitation. In any case, we plan to contact the patient by telephone for a follow-up status report regarding this interventional procedure.  Comments:  No additional relevant information.  Plan of Care  Orders:  Orders Placed This Encounter  Procedures   DG PAIN CLINIC C-ARM 1-60 MIN NO REPORT    Intraoperative interpretation by procedural physician at [facility].    Standing Status:   Standing    Number of Occurrences:   1    Reason for exam::   Assistance in needle guidance  and placement for procedures requiring needle placement in or near specific anatomical locations not easily accessible without such assistance.   Chronic Opioid Analgesic:  Norco 7.5 mg 3 times daily as needed, quantity 90/month; MME equals 22.5     Medications ordered for procedure: Meds ordered this encounter  Medications   iohexol (OMNIPAQUE) 180 MG/ML injection 10 mL    Must be Myelogram-compatible. If not available, you may substitute with a water-soluble, non-ionic, hypoallergenic, myelogram-compatible radiological contrast medium.   lidocaine (XYLOCAINE) 2 % (with pres) injection 400 mg   methylPREDNISolone acetate (DEPO-MEDROL) injection 40 mg   ropivacaine (PF) 2 mg/mL (0.2%) (NAROPIN) injection 4 mL   Sodium Hyaluronate (Viscosup) SOSY 20 mg    Do not substitute. Deliver to facility day before procedure.   Medications administered: We administered iohexol, lidocaine, methylPREDNISolone acetate, ropivacaine (PF) 2 mg/mL (0.2%), and Sodium Hyaluronate (Viscosup).  See the medical record for exact dosing, route, and time of administration.  Follow-up plan:   Return in about 8 weeks (around 04/17/2024) for VV PPE.    Recent Visits Date Type Provider Dept  01/25/24 Office Visit Cephus Collin, MD [facility]  Showing recent visits within past 90 days and meeting all other requirements Today's Visits Date Type Provider Dept  02/21/24 Procedure visit Cephus Collin, MD [facility]  Showing today's visits and meeting all other requirements Future Appointments Date Type Provider Dept  04/17/24 Appointment Cephus Collin, MD [facility]  Showing future appointments within next 90 days and meeting all other requirements  Disposition: Discharge home  Discharge (Date  Time): 02/21/2024; 1430 hrs.   Primary Care Physician: Rex Castor, MD Location: [facility] Note by: Cephus Collin, MD Date: 02/21/2024; Time: 4:30 PM  Disclaimer:  Medicine is not an exact science. The only guarantee in medicine is that nothing is guaranteed. It is important to note that the decision to proceed with this intervention was based on the information collected from the patient. The Data and conclusions were drawn from the patient's questionnaire, the interview, and the physical examination. Because the information was provided in large part by the patient, it cannot be guaranteed that it has not been purposely or unconsciously manipulated. Every effort has been made to obtain as much relevant data as possible for this evaluation. It is important to note that the conclusions that lead to this procedure are derived in large part from the available data. Always take into account that the treatment will also be dependent on availability of resources and existing treatment guidelines, considered by other Pain Management Practitioners as being common knowledge and practice, at the time of the intervention. For Medico-Legal purposes, it is also important to point out that variation in procedural techniques and pharmacological choices are the acceptable norm. The indications, contraindications, technique, and results of the above procedure should only be interpreted and judged by a Board-Certified Interventional Pain Specialist with extensive familiarity and expertise in the same exact procedure and technique.

## 2024-02-21 NOTE — Patient Instructions (Signed)

## 2024-02-22 ENCOUNTER — Telehealth: Payer: Self-pay | Admitting: *Deleted

## 2024-02-22 NOTE — Telephone Encounter (Signed)
 No problems post procedure.

## 2024-04-17 ENCOUNTER — Telehealth: Admitting: Student in an Organized Health Care Education/Training Program

## 2024-04-19 ENCOUNTER — Telehealth: Admitting: Student in an Organized Health Care Education/Training Program

## 2024-04-21 ENCOUNTER — Telehealth: Payer: Self-pay

## 2024-04-21 NOTE — Telephone Encounter (Signed)
 Attempt to contact patient to go over pre-appointment nurse questions prior to telephone visit on 04/21/24 with Dr. Rhesa Celeste. No answer and LVM.

## 2024-04-24 ENCOUNTER — Ambulatory Visit
Attending: Student in an Organized Health Care Education/Training Program | Admitting: Student in an Organized Health Care Education/Training Program

## 2024-04-24 ENCOUNTER — Encounter: Payer: Self-pay | Admitting: Student in an Organized Health Care Education/Training Program

## 2024-04-24 DIAGNOSIS — M25511 Pain in right shoulder: Secondary | ICD-10-CM

## 2024-04-24 DIAGNOSIS — M19012 Primary osteoarthritis, left shoulder: Secondary | ICD-10-CM | POA: Diagnosis not present

## 2024-04-24 DIAGNOSIS — M1712 Unilateral primary osteoarthritis, left knee: Secondary | ICD-10-CM | POA: Diagnosis not present

## 2024-04-24 DIAGNOSIS — M19011 Primary osteoarthritis, right shoulder: Secondary | ICD-10-CM

## 2024-04-24 DIAGNOSIS — M25512 Pain in left shoulder: Secondary | ICD-10-CM

## 2024-04-24 DIAGNOSIS — G8929 Other chronic pain: Secondary | ICD-10-CM

## 2024-04-24 NOTE — Progress Notes (Signed)
 Hello this is Dr. Erminio Hazy calling from the pain clinic help a basement how are you doing after the injections I see that about 80% pain relief in your knee and about 50% in your left shoulder okay excellent PROVIDER NOTE: Interpretation of information contained herein should be left to medically-trained personnel. Specific patient instructions are provided elsewhere under Patient Instructions section of medical record. This document was created in part using AI and STT-dictation technology, any transcriptional errors that may result from this process are unintentional.  Patient: Candace Juarez  Service: E/M   PCP: Candace Castor, MD  DOB: 28-Oct-1951  DOS: 04/24/2024  Provider: Cephus Collin, MD  MRN: [id number]  Delivery: Virtual Visit  Specialty: Interventional Pain Management  Type: Established Patient  Setting: Ambulatory outpatient facility  Specialty designation: 09  Referring Prov.: Candace Castor, MD  Location: Remote location       Virtual Encounter - Pain Management PROVIDER NOTE: Information contained herein reflects review and annotations entered in association with encounter. Interpretation of such information and data should be left to medically-trained personnel. Information provided to patient can be located elsewhere in the medical record under Patient Instructions. Document created using STT-dictation technology, any transcriptional errors that may result from process are unintentional.    Contact & Pharmacy Preferred: [phone] Home: [phone] (home) Mobile: [phone] (mobile) E-mail: [email or web address]  [id number] - [place], [place] - [address] AT [facility] [address] [facility] [address] [place] [address] Phone: [phone] Fax: [phone]   Pre-screening  [name] offered in-person vs virtual encounter. She indicated preferring virtual for this encounter.   Reason COVID-19*  Social distancing based on CDC and  AMA recommendations.   I contacted Candace Juarez on 04/24/2024 via telephone.      I clearly identified myself as Candace Collin, MD. I verified that I was speaking with the correct person using two identifiers (Name: Candace Juarez, and date of birth: Nov 11, 1950).  Consent I sought verbal advanced consent from Candace Juarez for virtual visit interactions. I informed Candace Juarez of possible security and privacy concerns, risks, and limitations associated with providing not-in-person medical evaluation and management services. I also informed Candace Juarez of the availability of in-person appointments. Finally, I informed her that there would be a charge for the virtual visit and that she could be  personally, fully or partially, financially responsible for it. Candace Juarez expressed understanding and agreed to proceed.   Historic Elements   Candace Juarez is a 73 y.o. year old, female patient evaluated today after our last contact on 02/21/2024. Candace Juarez  has a past medical history of Anxiety, Arthritis, CAD (coronary artery disease) (10/08/2021), CHF (congestive heart failure) (HCC) (08/27/2021), Chicken pox, Chronic kidney disease, Chronic pain syndrome, Chronic, continuous use of opioids, Complication of anesthesia, DDD (degenerative disc disease), cervical, Depression, Gastric ulcer, GERD (gastroesophageal reflux disease), History of bilateral cataract extraction, History of hiatal hernia, HLD (hyperlipidemia), HTN (hypertension), Hypothyroidism, Measles, Mumps, Osteoporosis, Rheumatoid arthritis (HCC), RLS (restless legs syndrome), Rosacea, Sjogren's disease (HCC), SLE (systemic lupus erythematosus) (HCC), and Therapeutic opioid-induced constipation (OIC). She also  has a past surgical history that includes Cholesteatoma excision (Right); Colonoscopy; Breast enhancement surgery (2004); Cholecystectomy; Laparoscopic incisional / umbilical / ventral hernia repair (2007); Total laparoscopic hysterectomy with bilateral  salpingo oophorectomy (Bilateral, 10/24/2020); Cystocele repair (N/A, 10/24/2020); Upper gastrointestinal endoscopy; Cataract extraction (Bilateral); Combined augmentation mammaplasty and abdominoplasty (1978); Limb sparing resection hip w/ saddle joint replacement (2008); Nasal endoscopy (2009); Endovenous  ablation saphenous vein w/ laser (N/A); Tubal ligation (Bilateral, 1975); Knee arthroscopy (Right, 2010); Knee Arthroplasty (Left); Hip Arthroplasty (Left); and Total hip arthroplasty (Right, 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 Candace Juarez has a current medication list which includes the following prescription(s): abatacept, acetaminophen , alprazolam , bupropion , cetirizine, cyclosporine , denosumab, diclofenac sodium, diphenoxylate -atropine , estradiol , fluticasone , hydrocodone -acetaminophen , hydroxychloroquine , levothyroxine , losartan , metoprolol  succinate, metronidazole, mupirocin 2% oint-hydrocortisone 2.5% cream-nystatin cream-zinc oxide 13% oint 1:1:1:5 mixture, ondansetron , pantoprazole , polyethyl glycol-propyl glycol, pseudoephedrine, simethicone , torsemide , and venlafaxine  xr. She  reports that she has never smoked. She has never used smokeless tobacco. She reports that she does not drink alcohol  and does not use drugs. Candace Juarez is allergic to rifampin , sulfa antibiotics, cephalexin, nsaids, and sulfur.  BMI: Estimated body mass index is 43.16 kg/m as calculated from the following:   Height as of 02/21/24: 5' 2 (1.575 m).   Weight as of 02/21/24: 236 lb (107 kg). Last encounter: 01/25/2024. Last procedure: 02/21/2024.  HPI  Today, she is being contacted for a post-procedure assessment.  Post-Procedure Evaluation   Type:  Hyalgan Intra-articular Knee Injection  Laterality: Left (-LT) Level/approach: Medial Imaging guidance: None required ([id number]) Anesthesia: Local anesthesia (1-2% Lidocaine ) DOS: 02/21/2024  Performed by: Candace Collin, MD  Purpose: Diagnostic/Therapeutic Indications: Knee arthralgia associated to  osteoarthritis of the knee Left knee OA  NAS-11 score:   Pre-procedure: 2 /10   Post-procedure: 0-No pain/10     Procedure #2: Left shoulder joint injection  Effectiveness:  Initial hour after procedure:  (knee 100%, shoulder 50%)  Subsequent 4-6 hours post-procedure:  (knee 50%, shoulder 50%)  Analgesia past initial 6 hours:  (knee 80%, shoulder 50%)  Ongoing improvement:  Analgesic:  knee 80%, left shoulder 50% Function: Ms. Rucci reports improvement in function ROM: Ms. Ehresman reports improvement in ROM   Laboratory Chemistry Profile   Renal Lab Results  Component Value Date   BUN 30 (H) 05/12/2023   CREATININE 1.20 (H) 05/12/2023   GFRNONAA 48 (L) 05/12/2023    Hepatic Lab Results  Component Value Date   AST 21 04/30/2023   ALT 20 04/30/2023   ALBUMIN 3.7 04/30/2023   ALKPHOS 66 04/30/2023   LIPASE 28 09/06/2020    Electrolytes Lab Results  Component Value Date   NA 132 (L) 05/12/2023   K 4.5 05/12/2023   CL 97 (L) 05/12/2023   CALCIUM 7.4 (L) 05/12/2023    Bone No results found for: VD25OH, VD125OH2TOT, VW0981XB1, YN8295AO1, 25OHVITD1, 25OHVITD2, 25OHVITD3, TESTOFREE, TESTOSTERONE  Inflammation (CRP: Acute Phase) (ESR: Chronic Phase) No results found for: CRP, ESRSEDRATE, LATICACIDVEN       Note: Above Lab results reviewed.  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 Diagnoses of Primary osteoarthritis of left knee and Chronic pain of both shoulders were also pertinent to this visit.  Plan of Care  Overall positive response to left knee Hyalgan injection and left shoulder injection.  Continue to monitor symptoms, repeat as needed.  Follow-up plan:   Return for patient will call to schedule F2F appt prn.     Recent Visits Date Type Provider Dept  02/21/24 Procedure visit Candace Collin, MD [facility]  01/25/24 Office Visit Candace Collin, MD [facility]  Showing recent visits within past 90 days and meeting all other requirements Today's Visits Date Type Provider Dept  04/24/24 Office Visit Candace Collin, MD [facility]  Showing today's visits and meeting all other requirements Future Appointments No visits were found meeting these conditions. Showing future appointments within  next 90 days and meeting all other requirements  I discussed the assessment and treatment plan with the patient. The patient was provided an opportunity to ask questions and all were answered. The patient agreed with the plan and demonstrated an understanding of the instructions.  Patient advised to call back or seek an in-person evaluation if the symptoms or condition worsens.  Duration of encounter: .  Note by: Candace Collin, MD Date: 04/24/2024; Time: 3:52 PM

## 2024-05-02 ENCOUNTER — Encounter: Payer: Self-pay | Admitting: Nurse Practitioner

## 2024-05-02 ENCOUNTER — Ambulatory Visit: Attending: Student in an Organized Health Care Education/Training Program | Admitting: Nurse Practitioner

## 2024-05-02 VITALS — BP 149/71 | HR 72 | Temp 99.5°F | Ht 62.0 in | Wt 240.0 lb

## 2024-05-02 DIAGNOSIS — M1712 Unilateral primary osteoarthritis, left knee: Secondary | ICD-10-CM | POA: Diagnosis present

## 2024-05-02 DIAGNOSIS — G894 Chronic pain syndrome: Secondary | ICD-10-CM | POA: Insufficient documentation

## 2024-05-02 DIAGNOSIS — Z79899 Other long term (current) drug therapy: Secondary | ICD-10-CM | POA: Diagnosis present

## 2024-05-02 DIAGNOSIS — M19012 Primary osteoarthritis, left shoulder: Secondary | ICD-10-CM | POA: Diagnosis present

## 2024-05-02 DIAGNOSIS — Z0289 Encounter for other administrative examinations: Secondary | ICD-10-CM | POA: Insufficient documentation

## 2024-05-02 DIAGNOSIS — Z96642 Presence of left artificial hip joint: Secondary | ICD-10-CM | POA: Diagnosis present

## 2024-05-02 DIAGNOSIS — M25551 Pain in right hip: Secondary | ICD-10-CM | POA: Diagnosis present

## 2024-05-02 DIAGNOSIS — G8929 Other chronic pain: Secondary | ICD-10-CM | POA: Insufficient documentation

## 2024-05-02 DIAGNOSIS — M069 Rheumatoid arthritis, unspecified: Secondary | ICD-10-CM | POA: Insufficient documentation

## 2024-05-02 DIAGNOSIS — M25511 Pain in right shoulder: Secondary | ICD-10-CM | POA: Diagnosis present

## 2024-05-02 DIAGNOSIS — M19011 Primary osteoarthritis, right shoulder: Secondary | ICD-10-CM | POA: Diagnosis not present

## 2024-05-02 DIAGNOSIS — M25512 Pain in left shoulder: Secondary | ICD-10-CM | POA: Insufficient documentation

## 2024-05-02 MED ORDER — HYDROCODONE-ACETAMINOPHEN 7.5-325 MG PO TABS: 1.0000 | ORAL_TABLET | Freq: Three times a day (TID) | ORAL | 0 refills | Status: DC | PRN

## 2024-05-02 MED ORDER — HYDROCODONE-ACETAMINOPHEN 7.5-325 MG PO TABS: 1.0000 | ORAL_TABLET | Freq: Three times a day (TID) | ORAL | 0 refills | Status: AC | PRN

## 2024-05-02 NOTE — Progress Notes (Signed)
 Safety precautions to be maintained throughout the outpatient stay will include: orient to surroundings, keep bed in low position, maintain call bell within reach at all times, provide assistance with transfer out of bed and ambulation.   Nursing Pain Medication Assessment:  Safety precautions to be maintained throughout the outpatient stay will include: orient to surroundings, keep bed in low position, maintain call bell within reach at all times, provide assistance with transfer out of bed and ambulation.  Medication Inspection Compliance: Pill count conducted under aseptic conditions, in front of the patient. Neither the pills nor the bottle was removed from the patient's sight at any time. Once count was completed pills were immediately returned to the patient in their original bottle.  Medication: Hydrocodone /APAP Pill/Patch Count: 13 of 90 pills/patches remain Pill/Patch Appearance: Markings consistent with prescribed medication Bottle Appearance: Standard pharmacy container. Clearly labeled. Filled Date: 5 / 68 / 2025 Last Medication intake:  Today

## 2024-05-02 NOTE — Progress Notes (Signed)
 PROVIDER NOTE: Interpretation of information contained herein should be left to medically-trained personnel. Specific patient instructions are provided elsewhere under Patient Instructions section of medical record. This document was created in part using AI and STT-dictation technology, any transcriptional errors that may result from this process are unintentional.  Patient: Candace Juarez  Service: E/M   PCP: Sherial Bail, MD  DOB: 20-Feb-1951  DOS: 05/02/2024  Provider: Emmy MARLA Blanch, NP  MRN: [id number]  Delivery: Face-to-face  Specialty: Interventional Pain Management  Type: Established Patient  Setting: Ambulatory outpatient facility  Specialty designation: 09  Referring Prov.: Sherial Bail, MD  Location: Outpatient office facility       History of present illness (HPI) [name], a 73 y.o. year old female, is here today because of her Chronic pain syndrome [G89.4]. [name] primary complain today is Back Pain (lower)  Pertinent problems: [name] has Anxiety; Polyarthralgia; Coronary artery disease (CAD); Arthritis; Continuous use of opioids; RLS (restless leg syndrome); SLE (systemic lupus erythematous); Osteoporosis; Rheumatoid arthritis (HCC); and Chronic pain syndrome on that pertinent problem list  Pain Assessment: Severity of Chronic pain is reported as a 1 /10. Location: Back Lower/denies. Onset: More than a month ago. Quality: Burning, Sharp. Timing: Intermittent. Modifying factor(s): sitting down, rest,. Vitals:  height is 5' 2 (1.575 m) and weight is 240 lb (108.9 kg). Her temperature is 99.5 F (37.5 C). Her blood pressure is 149/71 (abnormal) and her pulse is 72. Her oxygen saturation is 99%.  BMI: Estimated body mass index is 43.9 kg/m as calculated from the following:   Height as of this encounter: 5' 2 (1.575 m).   Weight as of this encounter: 240 lb (108.9 kg).  Last encounter: 04/24/2024 Last procedure: 02/21/2024  Reason for encounter: medication  management. No change in medical history since last visit.  Patient's pain is at baseline.  Patient continues multimodal pain regimen as prescribed.  States that it provides pain relief and improvement in functional status.   [name] experiences chronic lower back pain, with a persistent burning sensation that she describes as an overexertion particularly aggravated by activities involving frequent bending and standing.  Pharmacotherapy Assessment   Analgesic: Hydrocodone -acetaminophen  (Norco) 7.5-325 mg three times daily as needed for pain. MME=22.50 Monitoring: [place] PMP: PDMP reviewed during this encounter.       Pharmacotherapy: No side-effects or adverse reactions reported. Compliance: No problems identified. Effectiveness: Clinically acceptable.  Margrette Nathanel PARAS, RN  05/02/2024 11:27 AM  Sign when Signing Visit Safety precautions to be maintained throughout the outpatient stay will include: orient to surroundings, keep bed in low position, maintain call bell within reach at all times, provide assistance with transfer out of bed and ambulation.   Nursing Pain Medication Assessment:  Safety precautions to be maintained throughout the outpatient stay will include: orient to surroundings, keep bed in low position, maintain call bell within reach at all times, provide assistance with transfer out of bed and ambulation.  Medication Inspection Compliance: Pill count conducted under aseptic conditions, in front of the patient. Neither the pills nor the bottle was removed from the patient's sight at any time. Once count was completed pills were immediately returned to the patient in their original bottle.  Medication: Hydrocodone /APAP Pill/Patch Count: 13 of 90 pills/patches remain Pill/Patch Appearance: Markings consistent with prescribed medication Bottle Appearance: Standard pharmacy container. Clearly labeled. Filled Date: 5 / 30 / 2025 Last Medication intake:  Today    UDS:  Summary   Date Value Ref Range  Status  10/13/2023 FINAL  Final    Comment:    ==================================================================== [employer] Select 13 (MW) ==================================================================== Specimen Alert Not Detected result may be consistent with the time of last use noted for this medication. AS NEEDED (Tramadol ) ==================================================================== Test                             Result       Flag       Units  Drug Present and Declared for Prescription Verification   Alprazolam                      126          EXPECTED   ng/mg creat   Alpha-hydroxyalprazolam        81           EXPECTED   ng/mg creat    Source of alprazolam  is a scheduled prescription medication. Alpha-    hydroxyalprazolam is an expected metabolite of alprazolam .    Hydrocodone                     2394         EXPECTED   ng/mg creat   Dihydrocodeine                 545          EXPECTED   ng/mg creat   Norhydrocodone                 3185         EXPECTED   ng/mg creat    Sources of hydrocodone  include scheduled prescription medications.    Dihydrocodeine and norhydrocodone are expected metabolites of    hydrocodone . Dihydrocodeine is also available as a scheduled    prescription medication.  Drug Absent but Declared for Prescription Verification   Tramadol                        Not Detected UNEXPECTED ng/mg creat ==================================================================== Test                      Result    Flag   Units      Ref Range   Creatinine              53               mg/dL      >=79 ==================================================================== Declared Medications:  The flagging and interpretation on this report are based on the  following declared medications.  Unexpected results may arise from  inaccuracies in the declared medications.   **Note: The testing scope of this panel includes these medications:    Alprazolam  (Xanax )  Hydrocodone  (Norco)  Tramadol  (Ultram )   **Note: The testing scope of this panel does not include the  following reported medications:   Abatacept  Acetaminophen  (Tylenol )  Acetaminophen  (Norco)  Apixaban  (Eliquis )  Atropine  (Lomotil )  Bupropion  (Wellbutrin  XL)  Cetirizine (Zyrtec)  Cyclosporine  (Restasis )  Denosumab (Prolia)  Diclofenac (Voltaren)  Diphenoxylate  (Lomotil )  Estradiol  (Estrace )  Fluticasone  (Flonase )  Hydrocortisone  Hydroxychloroquine  (Plaquenil )  Levothyroxine  (Synthroid )  Losartan  (Cozaar )  Metoprolol   Metronidazole (MetroCream)  Mupirocin  Nystatin  Ondansetron  (Zofran )  Pantoprazole  (Protonix )  Pseudoephedrine (Sudafed)  Simethicone  (Mylicon)  Torsemide  (Demadex )  Venlafaxine  (Effexor )  Vitamin D3 ==================================================================== For clinical consultation, please call [phone]. ====================================================================     No results  found for: CBDTHCR No results found for: D8THCCBX No results found for: D9THCCBX  ROS  Constitutional: Denies any fever or chills Gastrointestinal: No reported hemesis, hematochezia, vomiting, or acute GI distress Musculoskeletal: Low back pain with burning sensation Neurological: No reported episodes of acute onset apraxia, aphasia, dysarthria, agnosia, amnesia, paralysis, loss of coordination, or loss of consciousness  Medication Review  ALPRAZolam , Abatacept, HYDROcodone -acetaminophen , Metoprolol  Succinate, Polyethyl Glycol-Propyl Glycol, acetaminophen , buPROPion , cetirizine, cycloSPORINE , denosumab, diclofenac Sodium, diphenoxylate -atropine , estradiol , fluticasone , hydroxychloroquine , levothyroxine , losartan , metroNIDAZOLE, mupirocin 2% oint-hydrocortisone 2.5% cream-nystatin cream-zinc oxide 13% oint 1:1:1:5 mixture, ondansetron , pantoprazole , pseudoephedrine, simethicone , torsemide , and venlafaxine  XR  History  Review  Allergy: [name] is allergic to rifampin , sulfa antibiotics, cephalexin, nsaids, and sulfur. Drug: [name]  reports no history of drug use. Alcohol :  reports no history of alcohol  use. Tobacco:  reports that she has never smoked. She has never used smokeless tobacco. Social: [name]  reports that she has never smoked. She has never used smokeless tobacco. She reports that she does not drink alcohol  and does not use drugs. Medical:  has a past medical history of Anxiety, Arthritis, CAD (coronary artery disease) (10/08/2021), CHF (congestive heart failure) (HCC) (08/27/2021), Chicken pox, Chronic kidney disease, Chronic pain syndrome, Chronic, continuous use of opioids, Complication of anesthesia, DDD (degenerative disc disease), cervical, Depression, Gastric ulcer, GERD (gastroesophageal reflux disease), History of bilateral cataract extraction, History of hiatal hernia, HLD (hyperlipidemia), HTN (hypertension), Hypothyroidism, Measles, Mumps, Osteoporosis, Rheumatoid arthritis (HCC), RLS (restless legs syndrome), Rosacea, Sjogren's disease (HCC), SLE (systemic lupus erythematosus) (HCC), and Therapeutic opioid-induced constipation (OIC). Surgical: [name]  has a past surgical history that includes Cholesteatoma excision (Right); Colonoscopy; Breast enhancement surgery (2004); Cholecystectomy; Laparoscopic incisional / umbilical / ventral hernia repair (2007); Total laparoscopic hysterectomy with bilateral salpingo oophorectomy (Bilateral, 10/24/2020); Cystocele repair (N/A, 10/24/2020); Upper gastrointestinal endoscopy; Cataract extraction (Bilateral); Combined augmentation mammaplasty and abdominoplasty (1978); Limb sparing resection hip w/ saddle joint replacement (2008); Nasal endoscopy (2009); Endovenous ablation saphenous vein w/ laser (N/A); Tubal ligation (Bilateral, 1975); Knee arthroscopy (Right, 2010); Knee Arthroplasty (Left); Hip Arthroplasty (Left); and Total hip arthroplasty (Right,  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 Family: family history is not on file.  Laboratory Chemistry Profile   Renal Lab Results  Component Value Date   BUN 30 (H) 05/12/2023   CREATININE 1.20 (H) 05/12/2023   GFRNONAA 48 (L) 05/12/2023    Hepatic Lab Results  Component Value Date   AST 21 04/30/2023   ALT 20 04/30/2023   ALBUMIN 3.7 04/30/2023   ALKPHOS 66 04/30/2023   LIPASE 28 09/06/2020    Electrolytes Lab Results  Component Value Date   NA 132 (L) 05/12/2023   K 4.5 05/12/2023   CL 97 (L) 05/12/2023   CALCIUM 7.4 (L) 05/12/2023    Bone No results found for: VD25OH, VD125OH2TOT, CI6874NY7, CI7874NY7, 25OHVITD1, 25OHVITD2, 25OHVITD3, TESTOFREE, TESTOSTERONE  Inflammation (CRP: Acute Phase) (ESR: Chronic Phase) No results found for: CRP, ESRSEDRATE, LATICACIDVEN       Note: Above Lab results reviewed.  Recent Imaging Review   Narrative & Impression  CLINICAL DATA:  Neck pain, chronic Neck pain, chronic, degenerative changes on xray Cervical radiculopathy, no red flags left neck and arm pain and left arm weakness   EXAM: MRI CERVICAL SPINE WITHOUT CONTRAST   TECHNIQUE: Multiplanar, multisequence MR imaging of the cervical spine was performed. No intravenous contrast was administered.   COMPARISON:  None Available.   FINDINGS: Alignment: Physiologic.   Vertebrae: No evidence of acute fracture, discitis, or aggressive osseous lesion.  Cord: Normal signal and morphology.   Posterior Fossa, vertebral arteries, paraspinal tissues: Negative.   Disc levels: The craniocervical junction is unremarkable.   C2-C3: No significant spinal canal or neural foraminal narrowing.   C3-C4: There is no significant disc bulge. Uncovertebral joint hypertrophy and bilateral facet arthropathy. There is no significant spinal canal stenosis. There is moderate bilateral neural foraminal narrowing.   C4-C5: Minimal disc bulging. Uncovertebral joint hypertrophy and bilateral  facet arthropathy. There is moderate left and mild right neural foraminal stenosis.Minimal spinal canal narrowing.   C5-C6: Minimal disc bulging and uncovertebral joint hypertrophy with bilateral facet arthropathy. Minimal spinal canal narrowing. There is moderate left and mild right neural foraminal narrowing.   C6-C7: Minimal disc bulging. Uncovertebral joint hypertrophy and bilateral facet arthropathy. No significant spinal canal stenosis. There is moderate left and mild-to-moderate right neural foraminal stenosis.   C7-T1: No significant spinal canal or neural foraminal narrowing.   IMPRESSION: Multilevel degenerative changes of the cervical spine, worst from C3-C7:   C3-C4: Moderate bilateral neural foraminal stenosis. No spinal canal stenosis.   C4-C5: Moderate left and mild right neural foraminal stenosis. Minimal spinal canal narrowing.   C5-C6: Moderate left and mild right neural foraminal stenosis. Minimal spinal canal narrowing.   C6-C7: Moderate left and mild-to-moderate right neural foraminal stenosis. No spinal canal stenosis.     Electronically Signed   By: Jacob  Kahn M.D.   On: 10/08/2022 12:53    Physical Exam  General appearance: Well nourished, well developed, and well hydrated. In no apparent acute distress Mental status: Alert, oriented x 3 (person, place, & time)       Respiratory: No evidence of acute respiratory distress Eyes: PERLA Vitals: BP (!) 149/71   Pulse 72   Temp 99.5 F (37.5 C)   Ht 5' 2 (1.575 m)   Wt 240 lb (108.9 kg)   SpO2 99%   BMI 43.90 kg/m  BMI: Estimated body mass index is 43.9 kg/m as calculated from the following:   Height as of this encounter: 5' 2 (1.575 m).   Weight as of this encounter: 240 lb (108.9 kg). Ideal: Ideal body weight: 50.1 kg (110 lb 7.2 oz) Adjusted ideal body weight: 73.6 kg (162 lb 4.3 oz)  Assessment   Diagnosis Status  1. Chronic pain syndrome   2. Rheumatoid arthritis involving  multiple sites, unspecified whether rheumatoid factor present (HCC)   3. Pain management contract signed   4. 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 Primary osteoarthritis of left knee   6. History of left hip replacement   7. Chronic pain of both shoulders   8. Chronic right hip pain   9. Medication management    Controlled Controlled Controlled   Updated Problems: No problems updated.  Plan of Care  Problem-specific:  Assessment and Plan We will continue on current medication regimen.  Prescribing drug monitoring (PDMP) reviewed; findings consistent with the use of prescribed medication and no evidence of narcotic misuse or abuse.  Urine drug screening (UDS) up-to-date.  General follow-up in 90 days for medication management.  The patient experienced good relief from previous left knee and left shoulder injections.  She was advised to schedule with Dr. Lateef for further injections as needed, or we can discuss and offer them during future visits if symptoms recur.    Ms. Delaynie Stetzer has a current medication list which includes the following long-term medication(s): abatacept, bupropion , cetirizine, estradiol , fluticasone , levothyroxine , losartan , metoprolol  succinate, pantoprazole , torsemide , and  venlafaxine  xr.  Pharmacotherapy (Medications Ordered): Meds ordered this encounter  Medications   HYDROcodone -acetaminophen  (NORCO) 7.5-325 MG tablet    Sig: Take 1 tablet by mouth 3 (three) times daily as needed for severe pain (pain score 7-10). Must last 30 days    Dispense:  90 tablet    Refill:  0    Chronic Pain: STOP Act (Not applicable) Fill 1 day early if closed on refill date. Avoid benzodiazepines within 8 hours of opioids   HYDROcodone -acetaminophen  (NORCO) 7.5-325 MG tablet    Sig: Take 1 tablet by mouth 3 (three) times daily as needed for severe pain (pain score 7-10). Must last 30 days    Dispense:  90 tablet    Refill:  0    Chronic Pain: STOP Act  (Not applicable) Fill 1 day early if closed on refill date. Avoid benzodiazepines within 8 hours of opioids   HYDROcodone -acetaminophen  (NORCO) 7.5-325 MG tablet    Sig: Take 1 tablet by mouth 3 (three) times daily as needed for severe pain (pain score 7-10). Must last 30 days    Dispense:  90 tablet    Refill:  0    Chronic Pain: STOP Act (Not applicable) Fill 1 day early if closed on refill date. Avoid benzodiazepines within 8 hours of opioids   Orders:  No orders of the defined types were placed in this encounter.     Return in about 3 months (around 08/02/2024) for (F2F), (MM), Emmy Blanch NP.    Recent Visits Date Type Provider Dept  04/24/24 Office Visit Marcelino Nurse, MD [facility]  02/21/24 Procedure visit Marcelino Nurse, MD [facility]  Showing recent visits within past 90 days and meeting all other requirements Today's Visits Date Type Provider Dept  05/02/24 Office Visit Chemere Steffler K, NP [facility]  Showing today's visits and meeting all other requirements Future Appointments Date Type Provider Dept  07/27/24 Appointment Dawson Albers K, NP [facility]  Showing future appointments within next 90 days and meeting all other requirements  I discussed the assessment and treatment plan with the patient. The patient was provided an opportunity to ask questions and all were answered. The patient agreed with the plan and demonstrated an understanding of the instructions.  Patient advised to call back or seek an in-person evaluation if the symptoms or condition worsens.  Duration of encounter: 30 minutes.  Total time on encounter, as per AMA guidelines included both the face-to-face and non-face-to-face time personally spent by the physician and/or other qualified health care professional(s) on the day of the encounter (includes time in activities that require the physician or other qualified health care professional and does not include time  in activities normally performed by clinical staff). Physician's time may include the following activities when performed: Preparing to see the patient (e.g., pre-charting review of records, searching for previously ordered imaging, lab work, and nerve conduction tests) Review of prior analgesic pharmacotherapies. Reviewing PMP Interpreting ordered tests (e.g., lab work, imaging, nerve conduction tests) Performing post-procedure evaluations, including interpretation of diagnostic procedures Obtaining and/or reviewing separately obtained history Performing a medically appropriate examination and/or evaluation Counseling and educating the patient/family/caregiver Ordering medications, tests, or procedures Referring and communicating with other health care professionals (when not separately reported) Documenting clinical information in the electronic or other health record Independently interpreting results (not separately reported) and communicating results to the patient/ family/caregiver Care coordination (not separately reported)  Note by: Annesha Delgreco K Vineta Carone, NP (TTS and AI  technology used. I apologize for any typographical errors that were not detected and corrected.) Date: 05/02/2024; Time: 11:59 AM

## 2024-07-07 ENCOUNTER — Telehealth: Payer: Self-pay | Admitting: Nurse Practitioner

## 2024-07-07 NOTE — Telephone Encounter (Signed)
 PT called stated when she went to pick up hydrocodone  7.5 she was told that she didn't have a refill to be pick up for this month. Please give patient a call.TY

## 2024-07-07 NOTE — Telephone Encounter (Signed)
 Spoke with patient and she states that they do have the prescription and there are no fuirther issues.

## 2024-07-27 ENCOUNTER — Ambulatory Visit: Attending: Nurse Practitioner | Admitting: Nurse Practitioner

## 2024-07-27 ENCOUNTER — Encounter: Payer: Self-pay | Admitting: Nurse Practitioner

## 2024-07-27 VITALS — BP 159/70 | HR 71 | Temp 97.9°F | Resp 18 | Ht 62.0 in | Wt 240.0 lb

## 2024-07-27 DIAGNOSIS — G894 Chronic pain syndrome: Secondary | ICD-10-CM | POA: Insufficient documentation

## 2024-07-27 DIAGNOSIS — M069 Rheumatoid arthritis, unspecified: Secondary | ICD-10-CM | POA: Diagnosis present

## 2024-07-27 DIAGNOSIS — M19012 Primary osteoarthritis, left shoulder: Secondary | ICD-10-CM | POA: Insufficient documentation

## 2024-07-27 DIAGNOSIS — Z0289 Encounter for other administrative examinations: Secondary | ICD-10-CM | POA: Insufficient documentation

## 2024-07-27 DIAGNOSIS — M1712 Unilateral primary osteoarthritis, left knee: Secondary | ICD-10-CM | POA: Diagnosis present

## 2024-07-27 DIAGNOSIS — Z79899 Other long term (current) drug therapy: Secondary | ICD-10-CM | POA: Diagnosis present

## 2024-07-27 DIAGNOSIS — M19011 Primary osteoarthritis, right shoulder: Secondary | ICD-10-CM | POA: Diagnosis present

## 2024-07-27 MED ORDER — HYDROCODONE-ACETAMINOPHEN 7.5-325 MG PO TABS: 1.0000 | ORAL_TABLET | Freq: Three times a day (TID) | ORAL | 0 refills | Status: AC | PRN

## 2024-07-27 MED ORDER — HYDROCODONE-ACETAMINOPHEN 7.5-325 MG PO TABS: 1.0000 | ORAL_TABLET | Freq: Three times a day (TID) | ORAL | 0 refills | Status: DC | PRN

## 2024-07-27 NOTE — Progress Notes (Signed)
 Nursing Pain Medication Assessment:  Safety precautions to be maintained throughout the outpatient stay will include: orient to surroundings, keep bed in low position, maintain call bell within reach at all times, provide assistance with transfer out of bed and ambulation.  Medication Inspection Compliance: Pill count conducted under aseptic conditions, in front of the patient. Neither the pills nor the bottle was removed from the patient's sight at any time. Once count was completed pills were immediately returned to the patient in their original bottle.  Medication: Hydrocodone /APAP Pill/Patch Count: 36 of 90 pills/patches remain Pill/Patch Appearance: Markings consistent with prescribed medication Bottle Appearance: Standard pharmacy container. Clearly labeled. Filled Date: 08 / 29 / 2025 Last Medication intake:  Today

## 2024-07-27 NOTE — Progress Notes (Signed)
 PROVIDER NOTE: Interpretation of information contained herein should be left to medically-trained personnel. Specific patient instructions are provided elsewhere under Patient Instructions section of medical record. This document was created in part using AI and STT-dictation technology, any transcriptional errors that may result from this process are unintentional.  Patient: Candace Juarez  Service: E/M   PCP: Sherial Bail, MD  DOB: 1951-03-02  DOS: 07/27/2024  Provider: Emmy MARLA Blanch, NP  MRN: [id number]  Delivery: Face-to-face  Specialty: Interventional Pain Management  Type: Established Patient  Setting: Ambulatory outpatient facility  Specialty designation: 09  Referring Prov.: Sherial Bail, MD  Location: Outpatient office facility       History of present illness (HPI) [name], a 73 y.o. year old female, is here today because of her Chronic pain syndrome [G89.4]. [name] primary complain today is Knee Pain (Bilateral Knee Pain )  Pertinent problems: [name] has  Anxiety; Polyarthralgia; Coronary artery disease (CAD); Arthritis; Continuous use of opioids; RLS (restless leg syndrome); SLE (systemic lupus erythematous); Osteoporosis; Rheumatoid arthritis (HCC); and Chronic pain syndrome on that pertinent problem list. Pain Assessment: Severity of Chronic pain is reported as a 3 /10. Location: Knee Right, Left/Denies. Onset: More than a month ago. Quality: Sore. Timing: Intermittent. Modifying factor(s): Rest. Vitals:  height is 5' 2 (1.575 m) and weight is 240 lb (108.9 kg). Her temporal temperature is 97.9 F (36.6 C). Her blood pressure is 159/70 (abnormal) and her pulse is 71. Her respiration is 18 and oxygen saturation is 99%.  BMI: Estimated body mass index is 43.9 kg/m as calculated from the following:   Height as of this encounter: 5' 2 (1.575 m).   Weight as of this encounter: 240 lb (108.9 kg).  Last encounter: 05/02/2024. Last procedure: 02/21/2024  Reason  for encounter: medication management. No change in medical history since last visit. Patient's pain is at baseline. Patient continues multimodal pain regimen as prescribed. States that it provides pain relief and improvement in functional status. Candace Juarez experiences chronic lower back pain, with a persistent burning sensation that she describes as an overexertion particularly aggravated by activities involving frequent bending and standing.  She also experiences bilateral knee pain worse on the left side than right.  She has upcoming appointments with her orthopedic specialist and wishes to complete that evaluation first with them for her knee. Pharmacotherapy Assessment   Analgesic: Hydrocodone -acetaminophen  (Norco) 7.5-325 mg three times daily as needed for pain. MME=22.50 Monitoring: [place] PMP: PDMP reviewed during this encounter.       Pharmacotherapy: No side-effects or adverse reactions reported. Compliance: No problems identified. Effectiveness: Clinically acceptable.  Candace Juarez, [place]  07/27/2024  2:16 PM  Sign when Signing Visit Nursing Pain Medication Assessment:  Safety precautions to be maintained throughout the outpatient stay will include: orient to surroundings, keep bed in low position, maintain call bell within reach at all times, provide assistance with transfer out of bed and ambulation.  Medication Inspection Compliance: Pill count conducted under aseptic conditions, in front of the patient. Neither the pills nor the bottle was removed from the patient's sight at any time. Once count was completed pills were immediately returned to the patient in their original bottle.  Medication: Hydrocodone /APAP Pill/Patch Count: 36 of 90 pills/patches remain Pill/Patch Appearance: Markings consistent with prescribed medication Bottle Appearance: Standard pharmacy container. Clearly labeled. Filled Date: 08 / 29 / 2025 Last Medication intake:  Today    UDS:  Summary  Date Value Ref  Range Status  10/13/2023 FINAL  Final    Comment:    ==================================================================== [employer] Select 13 (MW) ==================================================================== Specimen Alert Not Detected result may be consistent with the time of last use noted for this medication. AS NEEDED (Tramadol ) ==================================================================== Test                             Result       Flag       Units  Drug Present and Declared for Prescription Verification   Alprazolam                      126          EXPECTED   ng/mg creat   Alpha-hydroxyalprazolam        81           EXPECTED   ng/mg creat    Source of alprazolam  is a scheduled prescription medication. Alpha-    hydroxyalprazolam is an expected metabolite of alprazolam .    Hydrocodone                     2394         EXPECTED   ng/mg creat   Dihydrocodeine                 545          EXPECTED   ng/mg creat   Norhydrocodone                 3185         EXPECTED   ng/mg creat    Sources of hydrocodone  include scheduled prescription medications.    Dihydrocodeine and norhydrocodone are expected metabolites of    hydrocodone . Dihydrocodeine is also available as a scheduled    prescription medication.  Drug Absent but Declared for Prescription Verification   Tramadol                        Not Detected UNEXPECTED ng/mg creat ==================================================================== Test                      Result    Flag   Units      Ref Range   Creatinine              53               mg/dL      >=79 ==================================================================== Declared Medications:  The flagging and interpretation on this report are based on the  following declared medications.  Unexpected results may arise from  inaccuracies in the declared medications.   **Note: The testing scope of this panel includes these medications:   Alprazolam   (Xanax )  Hydrocodone  (Norco)  Tramadol  (Ultram )   **Note: The testing scope of this panel does not include the  following reported medications:   Abatacept  Acetaminophen  (Tylenol )  Acetaminophen  (Norco)  Apixaban  (Eliquis )  Atropine  (Lomotil )  Bupropion  (Wellbutrin  XL)  Cetirizine (Zyrtec)  Cyclosporine  (Restasis )  Denosumab (Prolia)  Diclofenac (Voltaren)  Diphenoxylate  (Lomotil )  Estradiol  (Estrace )  Fluticasone  (Flonase )  Hydrocortisone  Hydroxychloroquine  (Plaquenil )  Levothyroxine  (Synthroid )  Losartan  (Cozaar )  Metoprolol   Metronidazole (MetroCream)  Mupirocin  Nystatin  Ondansetron  (Zofran )  Pantoprazole  (Protonix )  Pseudoephedrine (Sudafed)  Simethicone  (Mylicon)  Torsemide  (Demadex )  Venlafaxine  (Effexor )  Vitamin D3 ==================================================================== For clinical consultation, please call [phone]. ====================================================================     No results found for:  CBDTHCR No results found for: D8THCCBX No results found for: D9THCCBX  ROS  Constitutional: Denies any fever or chills Gastrointestinal: No reported hemesis, hematochezia, vomiting, or acute GI distress Musculoskeletal: Bilateral knee pain (L>R) Neurological: No reported episodes of acute onset apraxia, aphasia, dysarthria, agnosia, amnesia, paralysis, loss of coordination, or loss of consciousness  Medication Review  ALPRAZolam , Abatacept, HYDROcodone -acetaminophen , Metoprolol  Succinate, Polyethyl Glycol-Propyl Glycol, acetaminophen , buPROPion , cetirizine, cycloSPORINE , denosumab, diclofenac Sodium, diphenoxylate -atropine , estradiol , fluticasone , hydroxychloroquine , levothyroxine , losartan , metroNIDAZOLE, mupirocin 2% oint-hydrocortisone 2.5% cream-nystatin cream-zinc oxide 13% oint 1:1:1:5 mixture, ondansetron , pantoprazole , pseudoephedrine, simethicone , torsemide , and venlafaxine  XR  History Review  Allergy: Ms.  Fyfe is allergic to rifampin , sulfa antibiotics, cephalexin, nsaids, and sulfur. Drug: Ms. Mendell  reports no history of drug use. Alcohol :  reports no history of alcohol  use. Tobacco:  reports that she has never smoked. She has never used smokeless tobacco. Social: Ms. Gama  reports that she has never smoked. She has never used smokeless tobacco. She reports that she does not drink alcohol  and does not use drugs. Medical:  has a past medical history of Anxiety, Arthritis, CAD (coronary artery disease) (10/08/2021), CHF (congestive heart failure) (HCC) (08/27/2021), Chicken pox, Chronic kidney disease, Chronic pain syndrome, Chronic, continuous use of opioids, Complication of anesthesia, DDD (degenerative disc disease), cervical, Depression, Gastric ulcer, GERD (gastroesophageal reflux disease), History of bilateral cataract extraction, History of hiatal hernia, HLD (hyperlipidemia), HTN (hypertension), Hypothyroidism, Measles, Mumps, Osteoporosis, Rheumatoid arthritis (HCC), RLS (restless legs syndrome), Rosacea, Sjogren's disease (HCC), SLE (systemic lupus erythematosus) (HCC), and Therapeutic opioid-induced constipation (OIC). Surgical: Ms. Torry  has a past surgical history that includes Cholesteatoma excision (Right); Colonoscopy; Breast enhancement surgery (2004); Cholecystectomy; Laparoscopic incisional / umbilical / ventral hernia repair (2007); Total laparoscopic hysterectomy with bilateral salpingo oophorectomy (Bilateral, 10/24/2020); Cystocele repair (N/A, 10/24/2020); Upper gastrointestinal endoscopy; Cataract extraction (Bilateral); Combined augmentation mammaplasty and abdominoplasty (1978); Limb sparing resection hip w/ saddle joint replacement (2008); Nasal endoscopy (2009); Endovenous ablation saphenous vein w/ laser (N/A); Tubal ligation (Bilateral, 1975); Knee arthroscopy (Right, 2010); Knee Arthroplasty (Left); Hip Arthroplasty (Left); and Total hip arthroplasty (Right, 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 Family:  family history is not on file.  Laboratory Chemistry Profile   Renal Lab Results  Component Value Date   BUN 30 (H) 05/12/2023   CREATININE 1.20 (H) 05/12/2023   GFRNONAA 48 (L) 05/12/2023    Hepatic Lab Results  Component Value Date   AST 21 04/30/2023   ALT 20 04/30/2023   ALBUMIN 3.7 04/30/2023   ALKPHOS 66 04/30/2023   LIPASE 28 09/06/2020    Electrolytes Lab Results  Component Value Date   NA 132 (L) 05/12/2023   K 4.5 05/12/2023   CL 97 (L) 05/12/2023   CALCIUM 7.4 (L) 05/12/2023    Bone No results found for: VD25OH, VD125OH2TOT, CI6874NY7, CI7874NY7, 25OHVITD1, 25OHVITD2, 25OHVITD3, TESTOFREE, TESTOSTERONE  Inflammation (CRP: Acute Phase) (ESR: Chronic Phase) No results found for: CRP, ESRSEDRATE, LATICACIDVEN       Note: Above Lab results reviewed.  Recent Imaging Review  DG PAIN CLINIC C-ARM 1-60 MIN NO REPORT Fluoro was used, but no Radiologist interpretation will be provided.  Please refer to NOTES tab for provider progress note. Note: Reviewed        Physical Exam  Vitals: BP (!) 159/70 (BP Location: Right Arm, Patient Position: Sitting)   Pulse 71   Temp 97.9 F (36.6 C) (Temporal)   Resp 18   Ht 5' 2 (1.575 m)   Wt 240 lb (108.9 kg)   SpO2 99%  BMI 43.90 kg/m  BMI: Estimated body mass index is 43.9 kg/m as calculated from the following:   Height as of this encounter: 5' 2 (1.575 m).   Weight as of this encounter: 240 lb (108.9 kg). Ideal: Ideal body weight: 50.1 kg (110 lb 7.2 oz) Adjusted ideal body weight: 73.6 kg (162 lb 4.3 oz) General appearance: Well nourished, well developed, and well hydrated. In no apparent acute distress Mental status: Alert, oriented x 3 (person, place, & time)       Respiratory: No evidence of acute respiratory distress Eyes: PERLA   Assessment   Diagnosis Status  1. Chronic pain syndrome   2. Rheumatoid arthritis involving multiple sites, unspecified whether rheumatoid factor  present (HCC)   3. 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 Primary osteoarthritis of left knee   5. Pain management contract signed   6. Medication management    Controlled Controlled Controlled   Updated Problems: Problem  Primary Osteoarthritis of Left Knee    Plan of Care  Problem-specific:  Assessment and Plan We will continue on current medication regimen.  Prescribing drug monitoring (PDMP) reviewed; findings consistent with the use of prescribed medication and no evidence of narcotic misuse or abuse.  Urine drug screening (UDS) up-to-date. Schedule follow-up in 90 days for medication management.  The patient experienced good relief from previous left knee and left shoulder injections.  She was advised to schedule with Dr. Lateef for further injections as needed, or we can discuss and offer them during future visits if symptoms reoccur.    Ms. Nastashia Gallo has a current medication list which includes the following long-term medication(s): abatacept, bupropion , cetirizine, estradiol , fluticasone , levothyroxine , metoprolol  succinate, pantoprazole , torsemide , venlafaxine  xr, and losartan .  Pharmacotherapy (Medications Ordered): Meds ordered this encounter  Medications   HYDROcodone -acetaminophen  (NORCO) 7.5-325 MG tablet    Sig: Take 1 tablet by mouth 3 (three) times daily as needed for severe pain (pain score 7-10). Must last 30 days    Dispense:  90 tablet    Refill:  0    Chronic Pain: STOP Act (Not applicable) Fill 1 day early if closed on refill date. Avoid benzodiazepines within 8 hours of opioids   HYDROcodone -acetaminophen  (NORCO) 7.5-325 MG tablet    Sig: Take 1 tablet by mouth 3 (three) times daily as needed for severe pain (pain score 7-10). Must last 30 days    Dispense:  90 tablet    Refill:  0    Chronic Pain: STOP Act (Not applicable) Fill 1 day early if closed on refill date. Avoid benzodiazepines within 8 hours of opioids    HYDROcodone -acetaminophen  (NORCO) 7.5-325 MG tablet    Sig: Take 1 tablet by mouth 3 (three) times daily as needed for severe pain (pain score 7-10). Must last 30 days    Dispense:  90 tablet    Refill:  0    Chronic Pain: STOP Act (Not applicable) Fill 1 day early if closed on refill date. Avoid benzodiazepines within 8 hours of opioids   Orders:  No orders of the defined types were placed in this encounter.       Return in about 3 months (around 10/26/2024) for (F2F), (MM), Emmy Blanch NP.    Recent Visits Date Type Provider Dept  05/02/24 Office Visit Brazos Sandoval K, NP [facility]  Showing recent visits within past 90 days and meeting all other requirements Today's Visits Date Type Provider Dept  07/27/24 Office Visit Kaylise Blakeley K, NP  [facility]  Showing today's visits and meeting all other requirements Future Appointments Date Type Provider Dept  10/19/24 Appointment Deval Mroczka K, NP [facility]  Showing future appointments within next 90 days and meeting all other requirements  I discussed the assessment and treatment plan with the patient. The patient was provided an opportunity to ask questions and all were answered. The patient agreed with the plan and demonstrated an understanding of the instructions.  Patient advised to call back or seek an in-person evaluation if the symptoms or condition worsens.  I personally spent a total of 30 minutes in the care of the patient today including preparing to see the patient, getting/reviewing separately obtained history, performing a medically appropriate exam/evaluation, counseling and educating, placing orders, documenting clinical information in the EHR, independently interpreting results, communicating results, and coordinating care.   Duration of encounter:  minutes.  Total time on encounter, as per AMA guidelines included both the face-to-face and non-face-to-face time personally spent by the  physician and/or other qualified health care professional(s) on the day of the encounter (includes time in activities that require the physician or other qualified health care professional and does not include time in activities normally performed by clinical staff). Physician's time may include the following activities when performed: Preparing to see the patient (e.g., pre-charting review of records, searching for previously ordered imaging, lab work, and nerve conduction tests) Review of prior analgesic pharmacotherapies. Reviewing PMP Interpreting ordered tests (e.g., lab work, imaging, nerve conduction tests) Performing post-procedure evaluations, including interpretation of diagnostic procedures Obtaining and/or reviewing separately obtained history Performing a medically appropriate examination and/or evaluation Counseling and educating the patient/family/caregiver Ordering medications, tests, or procedures Referring and communicating with other health care professionals (when not separately reported) Documenting clinical information in the electronic or other health record Independently interpreting results (not separately reported) and communicating results to the patient/ family/caregiver Care coordination (not separately reported)  Note by: Emmy MARLA Blanch, NP  Date: 07/27/2024; Time: 2:49 PM

## 2024-10-17 NOTE — Progress Notes (Unsigned)
 PROVIDER NOTE: Interpretation of information contained herein should be left to medically-trained personnel. Specific patient instructions are provided elsewhere under Patient Instructions section of medical record. This document was created in part using AI and STT-dictation technology, any transcriptional errors that may result from this process are unintentional.  Patient: Candace Juarez  Service: E/M   PCP: Sherial Bail, MD  DOB: [birth year]  DOS: 10/19/2024  Provider: Emmy MARLA Blanch, NP  MRN: [id number]  Delivery: Face-to-face  Specialty: Interventional Pain Management  Type: Established Patient  Setting: Ambulatory outpatient facility  Specialty designation: 09  Referring Prov.: Sherial Bail, MD  Location: Outpatient office facility       History of present illness (HPI) [name], a 73 y.o. year old female, is here today because of her No primary diagnosis found.. Candace Juarez's primary complain today is No chief complaint on file.  Pertinent problems: Candace Juarez has Rheumatoid arthritis involving multiple sites [facility]); Polyarthralgia; Status post right hip replacement; H/O right knee surgery; Chronic pain of both knees; Chronic right hip pain; Chronic pain syndrome; Pain management contract signed; Primary osteoarthritis of both shoulders; Chronic pain of both shoulders; Cervical radicular pain; Cervico-occipital neuralgia; Cervicalgia; Primary osteoarthritis of right hip; Status post total hip replacement, right; Primary osteoarthritis of left knee; Osteoporosis; Neuropathy; Lupus erythematosus; and Essential hypertension on their pertinent problem list.  Pain Assessment: Severity of   is reported as a  /10. Location:    / . Onset:  . Quality:  . Timing:  . Modifying factor(s):  SABRA Vitals:  vitals were not taken for this visit.  BMI: Estimated body mass index is 43.9 kg/m as calculated from the following:   Height as of 07/27/24: 5' 2 (1.575 m).   Weight as of 07/27/24: 240 lb (108.9  kg).  Last encounter: 07/27/2024. Last procedure: Visit date not found.  Reason for encounter:  *** .   Discussed the use of AI scribe software for clinical note transcription with the patient, who gave verbal consent to proceed.  History of Present Illness           Pharmacotherapy Assessment   Analgesic: Hydrocodone -acetaminophen  (Norco) 7.5-325 mg three times daily as needed for pain. MME=22.50 Monitoring: [place] PMP: PDMP reviewed during this encounter.       Pharmacotherapy: No side-effects or adverse reactions reported. Compliance: No problems identified. Effectiveness: Clinically acceptable.  No notes on file  UDS:  Summary  Date Value Ref Range Status  10/13/2023 FINAL  Final    Comment:    ==================================================================== [employer] [facility] 13 (MW) ==================================================================== Specimen Alert Not Detected result may be consistent with the time of last use noted for this medication. AS NEEDED (Tramadol ) ==================================================================== Test                             Result       Flag       Units  Drug Present and Declared for Prescription Verification   Alprazolam                      126          EXPECTED   ng/mg creat   Alpha-hydroxyalprazolam        81           EXPECTED   ng/mg creat    Source of alprazolam  is a scheduled prescription medication. Alpha-    hydroxyalprazolam is an expected metabolite of alprazolam .  Hydrocodone                     2394         EXPECTED   ng/mg creat   Dihydrocodeine                 545          EXPECTED   ng/mg creat   Norhydrocodone                 3185         EXPECTED   ng/mg creat    Sources of hydrocodone  include scheduled prescription medications.    Dihydrocodeine and norhydrocodone are expected metabolites of    hydrocodone . Dihydrocodeine is also available as a scheduled    prescription medication.  Drug  Absent but Declared for Prescription Verification   Tramadol                        Not Detected UNEXPECTED ng/mg creat ==================================================================== Test                      Result    Flag   Units      Ref Range   Creatinine              53               mg/dL      >=79 ==================================================================== Declared Medications:  The flagging and interpretation on this report are based on the  following declared medications.  Unexpected results may arise from  inaccuracies in the declared medications.   **Note: The testing scope of this panel includes these medications:   Alprazolam  (Xanax )  Hydrocodone  (Norco)  Tramadol  (Ultram )   **Note: The testing scope of this panel does not include the  following reported medications:   Abatacept  Acetaminophen  (Tylenol )  Acetaminophen  (Norco)  Apixaban  (Eliquis )  Atropine  (Lomotil )  Bupropion  (Wellbutrin  XL)  Cetirizine (Zyrtec)  Cyclosporine  (Restasis )  Denosumab (Prolia)  Diclofenac (Voltaren)  Diphenoxylate  (Lomotil )  Estradiol  (Estrace )  Fluticasone  (Flonase )  Hydrocortisone  Hydroxychloroquine  (Plaquenil )  Levothyroxine  (Synthroid )  Losartan  (Cozaar )  Metoprolol   Metronidazole (MetroCream)  Mupirocin  Nystatin  Ondansetron  (Zofran )  Pantoprazole  (Protonix )  Pseudoephedrine (Sudafed)  Simethicone  (Mylicon)  Torsemide  (Demadex )  Venlafaxine  (Effexor )  Vitamin D3 ==================================================================== For clinical consultation, please call [phone]. ====================================================================     No results found for: CBDTHCR No results found for: D8THCCBX No results found for: D9THCCBX  ROS  Constitutional: Denies any fever or chills Gastrointestinal: No reported hemesis, hematochezia, vomiting, or acute GI distress Musculoskeletal: Denies any acute onset joint swelling,  redness, loss of ROM, or weakness Neurological: No reported episodes of acute onset apraxia, aphasia, dysarthria, agnosia, amnesia, paralysis, loss of coordination, or loss of consciousness  Medication Review  ALPRAZolam , Abatacept, HYDROcodone -acetaminophen , Metoprolol  Succinate, Polyethyl Glycol-Propyl Glycol, acetaminophen , buPROPion , cetirizine, cycloSPORINE , denosumab, diclofenac Sodium, diphenoxylate -atropine , estradiol , fluticasone , hydroxychloroquine , levothyroxine , metroNIDAZOLE, mupirocin 2% oint-hydrocortisone 2.5% cream-nystatin cream-zinc oxide 13% oint 1:1:1:5 mixture, ondansetron , pantoprazole , pseudoephedrine, simethicone , torsemide , and venlafaxine  XR  History Review  Allergy: Candace Juarez is allergic to rifampin , sulfa antibiotics, cephalexin, nsaids, and sulfur. Drug: Candace Juarez  reports no history of drug use. Alcohol :  reports no history of alcohol  use. Tobacco:  reports that she has never smoked. She has never used smokeless tobacco. Social: Candace Juarez  reports that she has never smoked. She has never used smokeless tobacco. She reports that she does not drink  alcohol  and does not use drugs. Medical:  has a past medical history of Anxiety, Arthritis, CAD (coronary artery disease) (10/08/2021), CHF (congestive heart failure) (HCC) (08/27/2021), Chicken pox, Chronic kidney disease, Chronic pain syndrome, Chronic, continuous use of opioids, Complication of anesthesia, DDD (degenerative disc disease), cervical, Depression, Gastric ulcer, GERD (gastroesophageal reflux disease), History of bilateral cataract extraction, History of hiatal hernia, HLD (hyperlipidemia), HTN (hypertension), Hypothyroidism, Measles, Mumps, Osteoporosis, Rheumatoid arthritis (HCC), RLS (restless legs syndrome), Rosacea, Sjogren's disease, SLE (systemic lupus erythematosus) (HCC), and Therapeutic opioid-induced constipation (OIC). Surgical: Candace Juarez  has a past surgical history that includes Cholesteatoma excision  (Right); Colonoscopy; Breast enhancement surgery (2004); Cholecystectomy; Laparoscopic incisional / umbilical / ventral hernia repair (2007); Total laparoscopic hysterectomy with bilateral salpingo oophorectomy (Bilateral, 10/24/2020); Cystocele repair (N/A, 10/24/2020); Upper gastrointestinal endoscopy; Cataract extraction (Bilateral); Combined augmentation mammaplasty and abdominoplasty (1978); Limb sparing resection hip w/ saddle joint replacement (2008); Nasal endoscopy (2009); Endovenous ablation saphenous vein w/ laser (N/A); Tubal ligation (Bilateral, 1975); Knee arthroscopy (Right, 2010); Knee Arthroplasty (Left); Hip Arthroplasty (Left); and Total hip arthroplasty (Right, 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 Family: family history is not on file.  Laboratory Chemistry Profile   Renal Lab Results  Component Value Date   BUN 30 (H) 05/12/2023   CREATININE 1.20 (H) 05/12/2023   GFRNONAA 48 (L) 05/12/2023    Hepatic Lab Results  Component Value Date   AST 21 04/30/2023   ALT 20 04/30/2023   ALBUMIN 3.7 04/30/2023   ALKPHOS 66 04/30/2023   LIPASE 28 09/06/2020    Electrolytes Lab Results  Component Value Date   NA 132 (L) 05/12/2023   K 4.5 05/12/2023   CL 97 (L) 05/12/2023   CALCIUM 7.4 (L) 05/12/2023    Bone No results found for: VD25OH, VD125OH2TOT, CI6874NY7, CI7874NY7, 25OHVITD1, 25OHVITD2, 25OHVITD3, TESTOFREE, TESTOSTERONE  Inflammation (CRP: Acute Phase) (ESR: Chronic Phase) No results found for: CRP, ESRSEDRATE, LATICACIDVEN       Note: Above Lab results reviewed.  Recent Imaging Review  DG PAIN CLINIC C-ARM 1-60 MIN NO REPORT Fluoro was used, but no Radiologist interpretation will be provided.  Please refer to NOTES tab for provider progress note. Note: Reviewed        Physical Exam  Vitals: There were no vitals taken for this visit. BMI: Estimated body mass index is 43.9 kg/m as calculated from the following:   Height as of 07/27/24: 5' 2 (1.575 m).    Weight as of 07/27/24: 240 lb (108.9 kg). Ideal: Patient weight not recorded General appearance: Well nourished, well developed, and well hydrated. In no apparent acute distress Mental status: Alert, oriented x 3 (person, place, & time)       Respiratory: No evidence of acute respiratory distress Eyes: PERLA   Assessment   Diagnosis Status  1. Rheumatoid arthritis involving multiple sites, unspecified whether rheumatoid factor present (HCC)   2. Pain management contract signed   3. Chronic pain syndrome    Controlled Controlled Controlled   Updated Problems: Problem  Essential Hypertension  Osteoporosis  Neuropathy  Lupus Erythematosus    Plan of Care  Problem-specific:  Assessment and Plan            Candace Juarez has a current medication list which includes the following long-term medication(s): abatacept, bupropion , cetirizine, estradiol , fluticasone , levothyroxine , metoprolol  succinate, pantoprazole , torsemide , and venlafaxine  xr.  Pharmacotherapy (Medications Ordered): No orders of the defined types were placed in this encounter.  Orders:  No orders of the defined types were placed in this encounter.    {  There is no content from the last Plan section.}   No follow-ups on file.    Recent Visits Date Type Provider Dept  07/27/24 Office Visit Wilson Sample K, NP [facility]  Showing recent visits within past 90 days and meeting all other requirements Future Appointments Date Type Provider Dept  10/19/24 Appointment Myrta Mercer K, NP [facility]  Showing future appointments within next 90 days and meeting all other requirements  I discussed the assessment and treatment plan with the patient. The patient was provided an opportunity to ask questions and all were answered. The patient agreed with the plan and demonstrated an understanding of the instructions.  Patient advised to call back or seek an in-person evaluation if the symptoms or  condition worsens.  Duration of encounter: *** minutes.  Total time on encounter, as per AMA guidelines included both the face-to-face and non-face-to-face time personally spent by the physician and/or other qualified health care professional(s) on the day of the encounter (includes time in activities that require the physician or other qualified health care professional and does not include time in activities normally performed by clinical staff). Physician's time may include the following activities when performed: Preparing to see the patient (e.g., pre-charting review of records, searching for previously ordered imaging, lab work, and nerve conduction tests) Review of prior analgesic pharmacotherapies. Reviewing PMP Interpreting ordered tests (e.g., lab work, imaging, nerve conduction tests) Performing post-procedure evaluations, including interpretation of diagnostic procedures Obtaining and/or reviewing separately obtained history Performing a medically appropriate examination and/or evaluation Counseling and educating the patient/family/caregiver Ordering medications, tests, or procedures Referring and communicating with other health care professionals (when not separately reported) Documenting clinical information in the electronic or other health record Independently interpreting results (not separately reported) and communicating results to the patient/ family/caregiver Care coordination (not separately reported)  Note by: Makala Fetterolf K Xia Stohr, NP (TTS and AI technology used. I apologize for any typographical errors that were not detected and corrected.) Date: 10/19/2024; Time: 1:03 PM

## 2024-10-19 ENCOUNTER — Ambulatory Visit: Attending: Nurse Practitioner | Admitting: Nurse Practitioner

## 2024-10-19 ENCOUNTER — Encounter: Payer: Self-pay | Admitting: Nurse Practitioner

## 2024-10-19 VITALS — BP 135/59 | HR 72 | Temp 98.6°F | Resp 18 | Ht 62.0 in | Wt 240.0 lb

## 2024-10-19 DIAGNOSIS — Z96641 Presence of right artificial hip joint: Secondary | ICD-10-CM | POA: Diagnosis present

## 2024-10-19 DIAGNOSIS — M069 Rheumatoid arthritis, unspecified: Secondary | ICD-10-CM | POA: Diagnosis present

## 2024-10-19 DIAGNOSIS — M19012 Primary osteoarthritis, left shoulder: Secondary | ICD-10-CM | POA: Insufficient documentation

## 2024-10-19 DIAGNOSIS — Z0289 Encounter for other administrative examinations: Secondary | ICD-10-CM | POA: Insufficient documentation

## 2024-10-19 DIAGNOSIS — M19011 Primary osteoarthritis, right shoulder: Secondary | ICD-10-CM | POA: Diagnosis present

## 2024-10-19 DIAGNOSIS — G8929 Other chronic pain: Secondary | ICD-10-CM | POA: Insufficient documentation

## 2024-10-19 DIAGNOSIS — M1611 Unilateral primary osteoarthritis, right hip: Secondary | ICD-10-CM | POA: Diagnosis present

## 2024-10-19 DIAGNOSIS — Z79899 Other long term (current) drug therapy: Secondary | ICD-10-CM | POA: Insufficient documentation

## 2024-10-19 DIAGNOSIS — M25512 Pain in left shoulder: Secondary | ICD-10-CM | POA: Diagnosis present

## 2024-10-19 DIAGNOSIS — M25511 Pain in right shoulder: Secondary | ICD-10-CM | POA: Insufficient documentation

## 2024-10-19 DIAGNOSIS — Z9889 Other specified postprocedural states: Secondary | ICD-10-CM | POA: Diagnosis present

## 2024-10-19 DIAGNOSIS — M542 Cervicalgia: Secondary | ICD-10-CM | POA: Insufficient documentation

## 2024-10-19 DIAGNOSIS — G894 Chronic pain syndrome: Secondary | ICD-10-CM | POA: Diagnosis present

## 2024-10-19 MED ORDER — NALOXONE HCL 4 MG/0.1ML NA LIQD: 1.0000 | NASAL | 1 refills | Status: AC | PRN

## 2024-10-19 NOTE — Progress Notes (Signed)
 Nursing Pain Medication Assessment:  Safety precautions to be maintained throughout the outpatient stay will include: orient to surroundings, keep bed in low position, maintain call bell within reach at all times, provide assistance with transfer out of bed and ambulation.  Medication Inspection Compliance: Pill count conducted under aseptic conditions, in front of the patient. Neither the pills nor the bottle was removed from the patient's sight at any time. Once count was completed pills were immediately returned to the patient in their original bottle.  Medication: Hydrocodone /APAP Pill/Patch Count: 56 of 90 pills/patches remain Pill/Patch Appearance: Markings consistent with prescribed medication Bottle Appearance: Standard pharmacy container. Clearly labeled. Filled Date: 39 / 29 / 2025 Last Medication intake:  Today

## 2024-10-19 NOTE — Patient Instructions (Signed)
 ______________________________________________________________________    Opioid Pain Medication Update  To: All patients taking opioid pain medications. (I.e.: hydrocodone , hydromorphone, oxycodone , oxymorphone, morphine , codeine, methadone, tapentadol, tramadol , buprenorphine, fentanyl , etc.)  Re: Updated review of side effects and adverse reactions of opioid analgesics, as well as new information about long term effects of this class of medications.  Direct risks of long-term opioid therapy are not limited to opioid addiction and overdose. Potential medical risks include serious fractures, breathing problems during sleep, hyperalgesia, immunosuppression, chronic constipation, bowel obstruction, myocardial infarction, and tooth decay secondary to xerostomia.  Unpredictable adverse effects that can occur even if you take your medication correctly: Cognitive impairment, respiratory depression, and death. Most people think that if they take their medication correctly, and as instructed, that they will be safe. Nothing could be farther from the truth. In reality, a significant amount of recorded deaths associated with the use of opioids has occurred in individuals that had taken the medication for a long time, and were taking their medication correctly. The following are examples of how this can happen: Patient taking his/her medication for a long time, as instructed, without any side effects, is given a certain antibiotic or another unrelated medication, which in turn triggers a Drug-to-drug interaction leading to disorientation, cognitive impairment, impaired reflexes, respiratory depression or an untoward event leading to serious bodily harm or injury, including death.  Patient taking his/her medication for a long time, as instructed, without any side effects, develops an acute impairment of liver and/or kidney function. This will lead to a rapid inability of the body to breakdown and eliminate  their pain medication, which will result in effects similar to an overdose, but with the same medicine and dose that they had always taken. This again may lead to disorientation, cognitive impairment, impaired reflexes, respiratory depression or an untoward event leading to serious bodily harm or injury, including death.  A similar problem will occur with patients as they grow older and their liver and kidney function begins to decrease as part of the aging process.  Background information: Historically, the original case for using long-term opioid therapy to treat chronic noncancer pain was based on safety assumptions that subsequent experience has called into question. In 1996, the American Pain Society and the American Academy of Pain Medicine issued a consensus statement supporting long-term opioid therapy. This statement acknowledged the dangers of opioid prescribing but concluded that the risk for addiction was low; respiratory depression induced by opioids was short-lived, occurred mainly in opioid-naive patients, and was antagonized by pain; tolerance was not a common problem; and efforts to control diversion should not constrain opioid prescribing. This has now proven to be wrong. Experience regarding the risks for opioid addiction, misuse, and overdose in community practice has failed to support these assumptions.  According to the Centers for Disease Control and Prevention, fatal overdoses involving opioid analgesics have increased sharply over the past decade. Currently, more than 96,700 people die from drug overdoses every year. Opioids are a factor in 7 out of every 10 overdose deaths. Deaths from drug overdose have surpassed motor vehicle accidents as the leading cause of death for individuals between the ages of 16 and 13.  Clinical data suggest that neuroendocrine dysfunction may be very common in both men and women, potentially causing hypogonadism, erectile dysfunction, infertility,  decreased libido, osteoporosis, and depression. Recent studies linked higher opioid dose to increased opioid-related mortality. Controlled observational studies reported that long-term opioid therapy may be associated with increased risk for cardiovascular events. Subsequent  meta-analysis concluded that the safety of long-term opioid therapy in elderly patients has not been proven.   Side Effects and adverse reactions: Common side effects: Drowsiness (sedation). Dizziness. Nausea and vomiting. Constipation. Physical dependence -- Dependence often manifests with withdrawal symptoms when opioids are discontinued or decreased. Tolerance -- As you take repeated doses of opioids, you require increased medication to experience the same effect of pain relief. Respiratory depression -- This can occur in healthy people, especially with higher doses. However, people with COPD, asthma or other lung conditions may be even more susceptible to fatal respiratory impairment.  Uncommon side effects: An increased sensitivity to feeling pain and extreme response to pain (hyperalgesia). Chronic use of opioids can lead to this. Delayed gastric emptying (the process by which the contents of your stomach are moved into your small intestine). Muscle rigidity. Immune system and hormonal dysfunction. Quick, involuntary muscle jerks (myoclonus). Arrhythmia. Itchy skin (pruritus). Dry mouth (xerostomia).  Long-term side effects: Chronic constipation. Sleep-disordered breathing (SDB). Increased risk of bone fractures. Hypothalamic-pituitary-adrenal dysregulation. Increased risk of overdose.  RISKS: Respiratory depression and death: Opioids increase the risk of respiratory depression and death.  Drug-to-drug interactions: Opioids are relatively contraindicated in combination with benzodiazepines, sleep inducers, and other central nervous system depressants. Other classes of medications (i.e.: certain antibiotics  and even over-the-counter medications) may also trigger or induce respiratory depression in some patients.  Medical conditions: Patients with pre-existing respiratory problems are at higher risk of respiratory failure and/or depression when in combination with opioid analgesics. Opioids are relatively contraindicated in some medical conditions such as central sleep apnea.   Fractures and Falls:  Opioids increase the risk and incidence of falls. This is of particular importance in elderly patients.  Endocrine System:  Long-term administration is associated with endocrine abnormalities (endocrinopathies). (Also known as Opioid-induced Endocrinopathy) Influences on both the hypothalamic-pituitary-adrenal axis?and the hypothalamic-pituitary-gonadal axis have been demonstrated with consequent hypogonadism and adrenal insufficiency in both sexes. Hypogonadism and decreased levels of dehydroepiandrosterone sulfate have been reported in men and women. Endocrine effects include: Amenorrhoea in women (abnormal absence of menstruation) Reduced libido in both sexes Decreased sexual function Erectile dysfunction in men Hypogonadisms (decreased testicular function with shrinkage of testicles) Infertility Depression and fatigue Loss of muscle mass Anxiety Depression Immune suppression Hyperalgesia Weight gain Anemia Osteoporosis Patients (particularly women of childbearing age) should avoid opioids. There is insufficient evidence to recommend routine monitoring of asymptomatic patients taking opioids in the long-term for hormonal deficiencies.  Immune System: Human studies have demonstrated that opioids have an immunomodulating effect. These effects are mediated via opioid receptors both on immune effector cells and in the central nervous system. Opioids have been demonstrated to have adverse effects on antimicrobial response and anti-tumour surveillance. Buprenorphine has been demonstrated to have  no impact on immune function.  Opioid Induced Hyperalgesia: Human studies have demonstrated that prolonged use of opioids can lead to a state of abnormal pain sensitivity, sometimes called opioid induced hyperalgesia (OIH). Opioid induced hyperalgesia is not usually seen in the absence of tolerance to opioid analgesia. Clinically, hyperalgesia may be diagnosed if the patient on long-term opioid therapy presents with increased pain. This might be qualitatively and anatomically distinct from pain related to disease progression or to breakthrough pain resulting from development of opioid tolerance. Pain associated with hyperalgesia tends to be more diffuse than the pre-existing pain and less defined in quality. Management of opioid induced hyperalgesia requires opioid dose reduction.  Cancer: Chronic opioid therapy has been associated with an increased risk  of cancer among noncancer patients with chronic pain. This association was more evident in chronic strong opioid users. Chronic opioid consumption causes significant pathological changes in the small intestine and colon. Epidemiological studies have found that there is a link between opium  dependence and initiation of gastrointestinal cancers. Cancer is the second leading cause of death after cardiovascular disease. Chronic use of opioids can cause multiple conditions such as GERD, immunosuppression and renal damage as well as carcinogenic effects, which are associated with the incidence of cancers.   Mortality: Long-term opioid use has been associated with increased mortality among patients with chronic non-cancer pain (CNCP).  Prescription of long-acting opioids for chronic noncancer pain was associated with a significantly increased risk of all-cause mortality, including deaths from causes other than overdose.  Reference: Von Korff M, Kolodny A, Deyo RA, Chou R. Long-term opioid therapy reconsidered. Ann Intern Med. 2011 Sep 6;155(5):325-8. doi:  10.7326/[id number]. PMID: 78106373; PMCID: [id number]. Kit JINNY Laurence CINDERELLA Pearley JINNY, Hayward RA, Dunn KM, Jordan KP. Risk of adverse events in patients prescribed long-term opioids: A cohort study in the [place] Clinical Practice Research [employer]. Eur J Pain. 2019 May;23(5):908-922. doi: 10.1002/ejp.1357. Epub 2019 Jan 31. PMID: 69379883. Colameco S, Coren JS, Ciervo CA. Continuous opioid treatment for chronic noncancer pain: a time for moderation in prescribing. Postgrad Med. 2009 Jul;121(4):61-6. doi: 10.3810/pgm.2009.07.2032. PMID: 80358728. Gigi JONELLE Shlomo MILUS Levern IVER Conny RN, [place], Blazina I, Lonell DASEN, Bougatsos C, Deyo RA. The effectiveness and risks of long-term opioid therapy for chronic pain: a systematic review for a [employer] of [employer] Pathways to [employer]. Ann Intern Med. 2015 Feb 17;162(4):276-86. doi: 10.7326/M14-2559. PMID: 74418742. Rory CHRISTELLA [facility], [place] DM. NCHS Data Brief No. 22. [place]: Centers for Disease Control and Prevention; 2009. Sep, Increase in Fatal Poisonings Involving Opioid Analgesics in the [place] , 1999-2006. Song IA, Choi HR, Oh TK. Long-term opioid use and mortality in patients with chronic non-cancer pain: Ten-year follow-up study in [place] from 2010 through 2019. EClinicalMedicine. 2022 Jul 18;51:101558. doi: 10.1016/j.eclinm.[id number]. PMID: 64124182; PMCID: EFR0695089. Huser, W., Schubert, T., Vogelmann, T. et al. All-cause mortality in patients with long-term opioid therapy compared with non-opioid analgesics for chronic non-cancer pain: a database study. BMC Med 18, 162 (2020). http://lester.info/ Rashidian H, Zendehdel K, Kamangar F, Malekzadeh R, Haghdoost AA. An Ecological Study of the Association between Opiate Use and Incidence of Cancers. Addict Health. 2016 Fall;8(4):252-260. PMID: 71180443; PMCID: [id number].  Our Goal: Our goal is to control your pain with means other  than the use of opioid pain medications.  Our Recommendation: Talk to your physician about coming off of these medications. We can assist you with the tapering down and stopping these medicines. Based on the new information, even if you cannot completely stop the medication, a decrease in the dose may be associated with a lesser risk. Ask for other means of controlling the pain. Decrease or eliminate those factors that significantly contribute to your pain such as smoking, obesity, and a diet heavily tilted towards inflammatory nutrients.  Last Updated: 05/17/2023   ______________________________________________________________________       ______________________________________________________________________    WARNING: CBD (cannabidiol) & Delta (Delta-8 tetrahydrocannabinol) products.   Applicable to:  All individuals currently taking or considering taking CBD (cannabidiol) and, more important, all patients taking opioid analgesic controlled substances (pain medication). (Example: oxycodone ; oxymorphone; hydrocodone ; hydromorphone; morphine ; methadone; tramadol ; tapentadol; fentanyl ; buprenorphine; butorphanol; dextromethorphan; meperidine; codeine; etc.)  Introduction:  Recently there has been a drive towards the use of natural products  for the treatment of different conditions, including pain anxiety and sleep disorders. Marijuana and hemp are two varieties of the cannabis genus plants. Marijuana and its derivatives are illegal, while hemp and its derivatives are not. Cannabidiol (CBD) and tetrahydrocannabinol (THC), are two natural compounds found in plants of the Cannabis genus. They can both be extracted from hemp or marijuana. Both compounds interact with your bodys endocannabinoid system in very different ways. CBD is associated with pain relief (analgesia) while THC is associated with the psychoactive effects (the high) obtained from the use of marijuana products. There are  two main types of THC: Delta-9, which comes from the marijuana plant and it is illegal, and Delta-8, which comes from the hemp plant, and it is legal. (Both, Delta-9-THC and Delta-8-THC are psychoactive and give you the high.)   Legality:  Marijuana and its derivatives: illegal Hemp and its derivatives: Legal (State dependent) UPDATE: (12/26/2021) The Drug Enforcement Agency (DEA) issued a letter stating that delta cannabinoids, including Delta-8-THCO and Delta-9-THCO, synthetically derived from hemp do not qualify as hemp and will be viewed as Schedule I drugs. (Schedule I drugs, substances, or chemicals are defined as drugs with no currently accepted medical use and a high potential for abuse. Some examples of Schedule I drugs are: heroin, lysergic acid diethylamide (LSD), marijuana (cannabis), 3,4-methylenedioxymethamphetamine (ecstasy), methaqualone, and peyote.) (cuetune.com.ee)  Legal status of CBD in [place]:  Conditionally Legal  Reference: FDA Regulation of Cannabis and Cannabis-Derived Products, Including Cannabidiol (CBD) - oemdeals.dk  Warning:  CBD is not FDA approved and has not undergo the same manufacturing controls as prescription drugs.  This means that the purity and safety of available CBD may be questionable. Most of the time, despite manufacturer's claims, it is contaminated with THC (delta-9-tetrahydrocannabinol - the chemical in marijuana responsible for the HIGH).  When this is the case, the [facility] contaminant will trigger a positive urine drug screen (UDS) test for Marijuana (carboxy-THC).   The FDA recently put out a warning about 5 things that everyone should be aware of regarding Delta-8 THC: Delta-8 THC products have not been evaluated or approved by the FDA for safe use and may be marketed in ways that put the public health at risk. The FDA has  received adverse event reports involving delta-8 THC-containing products. Delta-8 THC has psychoactive and intoxicating effects. Delta-8 THC manufacturing often involve use of potentially harmful chemicals to create the concentrations of delta-8 THC claimed in the marketplace. The final delta-8 THC product may have potentially harmful by-products (contaminants) due to the chemicals used in the process. Manufacturing of delta-8 THC products may occur in uncontrolled or unsanitary settings, which may lead to the presence of unsafe contaminants or other potentially harmful substances. Delta-8 THC products should be kept out of the reach of children and pets.  NOTE: Because a positive UDS for any illicit substance is a violation of our medication agreement, your opioid analgesics (pain medicine) may be permanently discontinued.  MORE ABOUT CBD  General Information: CBD was discovered in [birth year] and it is a derivative of the cannabis sativa genus plants (Marijuana and Hemp). It is one of the 113 identified substances found in Marijuana. It accounts for up to 40% of the plant's extract. As of 2018, preliminary clinical studies on CBD included research for the treatment of anxiety, movement disorders, and pain. CBD is available and consumed in multiple forms, including inhalation of smoke or vapor, as an aerosol spray, and by mouth. It may be supplied as an oil  containing CBD, capsules, dried cannabis, or as a liquid solution. CBD is thought not to be as psychoactive as THC (delta-9-tetrahydrocannabinol - the chemical in marijuana responsible for the HIGH). Studies suggest that CBD may interact with different biological target receptors in the body, including cannabinoid and other neurotransmitter receptors. As of 2018 the mechanism of action for its biological effects has not been determined.  Side-effects  Adverse reactions: Dry mouth, diarrhea, decreased appetite, fatigue, drowsiness, malaise, weakness, sleep  disturbances, and others.  Drug interactions:  CBD may interact with medications such as blood-thinners. CBD causes drowsiness on its own and it will increase drowsiness caused by other medications, including antihistamines (such as Benadryl ), benzodiazepines (Xanax , Ativan, Valium ), antipsychotics, antidepressants, opioids, alcohol  and supplements such as kava, melatonin and St. John's Wort.  Other drug interactions: Brivaracetam (Briviact); Caffeine; Carbamazepine (Tegretol); Citalopram (Celexa); Clobazam (Onfi); Eslicarbazepine (Aptiom); Everolimus (Zostress); Lithium; Methadone (Dolophine); Rufinamide (Banzel); Sedative medications (CNS depressants); Sirolimus (Rapamune); Stiripentol (Diacomit); Tacrolimus (Prograf); Tamoxifen ; Soltamox); Topiramate (Topamax); Valproate; Warfarin (Coumadin); Zonisamide. (Last update: 10/19/2022) ______________________________________________________________________     ______________________________________________________________________    Update on Controlled Substance (Opioid) Regulations   To: All patients taking opioid pain medications. (I.e.: hydrocodone , hydromorphone, oxycodone , oxymorphone, morphine , codeine, methadone, tapentadol, tramadol , buprenorphine, fentanyl , etc.)  Re: Review on the state of controlled substance regulations.  Introduction: Rules and regulations associated with all aspects of controlled substances are constantly being modified. Unfortunately we have encountered patients questioning the veracity of the information that we provide them about these changes. This is intended to provide them with appropriate references and a historical review of these changes.  A Brief History: As of August 24, 2016, the [place]  Government declared the opioid epidemic a public health emergency. Prescription drug monitoring programs (PDMPs) and the [facility] All Schedules Prescription Electronic Reporting Act (NASPER). Before 1800, clinicians regarded  pain as an existential phenomenon, a consequence of aging. There was no regulation on the use of cocaine and opioids, resulting in widespread marketing and prescribing for many ailments ranging from diarrhea to toothache. The [employer] of [phone], passed in response to the sudden emergence of street heroin abuse as well as iatrogenic morphine  dependence, influenced both physician and patient alike to avoid opiates. Patients with unexplained pain in the 1920s were regarded as deluded, malingering, or abusers, and cancer patients through the 1950s were encouraged to wean themselves off opioids until their lives could be measured in weeks. Alongside this opioid evolution, the American Pain Society launched their influential pain as the fifth vital sign campaign in 1995. Concurrently, pharmaceutical companies introduced new formulations, such as extended release oxycodone  (OxyContin ). From 1997 to 2002, OxyContin  prescriptions increased from 670,000 to 6.2 million. However, concerns soon began to surface regarding overzealous opioid treatment. It must be noted that pharmaceutical companies contributed significantly to the rise of the opioid epidemic, receiving considerable reprimands as a consequence. In 2007, as the opioid epidemic began to inflict profound damage, [employer] pleaded guilty to federal charges related to the misbranding of OxyContin . [employer] agreed to pay a total of $634.5 million to resolve Justice Department investigations, as well as a $19.5 million settlement to [address] and the [address] of [place].  In response to the current epidemic, changes in focus to the development of new abuse deterrent opioid formulations at the [place]  Food and Drug Administration (FDA) as well as drafting of new public standards for pain treatment were created at [facility] in 2017. In response to the opioid epidemic, FDA public policy changes  were announced in February 2016. Among these new positions were a  re-examination of the risk-benefit paradigm for opioids with strict emphasis on the large public health ramifications. The various modified opioids released over the past 20 years, such as tamper-resistant preparation, have had differing levels of success, and are collectively referred to as Risk Evaluation and Mitigation Strategies (REMS). There is also a growing focus on preventing opioid use disorder (OUD) and on offering affected individuals accessible and effective treatment. [place]  government policy reflects these changes and both the Affordable Care Act and the Mental Health Parity and Addiction Equity Act were major steps forward in treating opioid addiction. The Affordable Care Act, which was signed into law in 2009-10-24, with major provisions coming into effect by 10-24-13.  In the 1990s, the intensified marketing of newly reformulated prescription opioid medications (e.g., OxyContin ) and an influential pain advocacy campaign that encouraged greater pain management led to a precipitous rise in opioid use in the [place] . Research from the Centers for Disease Control and Prevention (CDC) shows that prescription opioid sales in the [place]  quadrupled from 10/24/1998 to 24-Oct-2009. At the same time, opioid misuse and opioid-involved overdose deaths increased (Figure 1). Between 10/24/98 and October 24, 2009, the rate of opioidinvolved overdose deaths in the [place]  doubled from 2.9 to 6.8 deaths per 100,000 people. This initial rise in opioid-related deaths is often referred to as the first wave of the recent opioid crisis.  Between 24-Oct-1998 and 2019/10/25, 565,000 Americans died of opioid-involved overdoses. In turn, federal, state, and local governments responded with various legal and policy efforts to curb opioid misuse and drug-related overdose Deaths.  Recent Congresses have enacted several laws addressing the opioid crisis, such as the Comprehensive Addiction and Recovery Act of 10-25-2015 (CARA, P.L. 114-198); the  21st Century Cures Act (P.L. 114-255); the Substance UseDisorder Prevention that Promotes Opioid Recovery and Treatment for Patients and Communities Act (SUPPORT Act, P.L. ([phone]); the Fentanyl  Sanctions Act (Title LXXII of P.L. A1944156); and the Blocking Deadly Fentanyl  Imports Act (P.L. 117-81, 6610). These laws addressed overprescribing and misuse of opioids, expanded substance use disorder prevention and treatment capacities, bolstered drug diversion capabilities, and enhanced international drug interdiction, counternarcotics cooperation, and sanctions efforts. Congress also directed additional funds to many of these initiatives through appropriations.  Congress provided funding in the [employer] Act of 10-24-20 [phone]; P.L. 117-2) for syringe services programs (often known as needle exchange programs) and other harm reduction initiatives. Federal and state harm reduction strategies have frequently involved the distribution of naloxone (e.g., Narcan)--a medication used to reverse an opioid overdose--and test strips used to detect fentanyl  in drug samples.  The Department of Justice (DOJ) and Department of Homeland Security [facility]) aim to reduce the diversion of prescription opioids and the use, manufacturing, and trafficking of illicit opioids. DOJ--via the Drug Enforcement Administration (DEA)--regulates opioid manufacturers, distributors, and dispensers; it also controls the opioid supply through enforcement of regulatory requirements.  A History of Opiate Laws in the [place]   Prior to October 24, 1889, laws concerning opiates were strictly imposed on a local city or state-by-state basis. One of the first was in [place] in 10/24/1874 where it became illegal to smoke opium  only in opium  dens. It did not ban the sale, import or use otherwise. In the next 25 years different states enacted opium  laws ranging from outlawing opium  dens altogether to making possession of opium , morphine   and heroin without a physicians prescription illegal.  The first Congressional Act took place in Oct 24, 1889  that levied taxes on morphine  and opium . From that time on the [employer] has had a series of laws and acts directly aimed at opiate use, abuse and control. These are outlined below:  1906 - Pure Food and Drug Act Preventing the manufacture, sale, or transportation of adulterated or misbranded or poisonous or deleterious foods, drugs, medicines, and liquors, and for regulating traffic therein, and for other purposes. Punishment included fines and prison time.  1909   - Smoking Opium  Exclusion Act Banned the importation, possession and use of smoking opium . Did not regulate opium -based medications. First Freight forwarder banning the non-medical use of a substance.  1914  - The Margrette Act In summary, The Margrette Act of 1914 was written more to have all parties involved in importing, exporting, set designer and distributing opium  or cocaine to register with the [employer] and have taxes levied upon them. Exempt from the law were physicians operating in the course of his professional practice  1919 - Supreme Court ratified the Bj's in [place] et al., v. [place]  and [place]  v. [place], then again in [facility] v. [place] , in 1920, holding that doctors may not prescribe maintenance supplies of narcotics to people addicted to narcotics. However, it does not prohibit doctors from prescribing narcotics to wean a patient off of the drug. It was also the opinion of the court that prescribing narcotics to habitual users was not considered professional practice hence it then was considered illegal for doctors to prescribe opioids for the purposes of maintaining an addiction. It can be argued that todays addiction medications are not intended to maintain an addiction but to facilitate addiction remission. In which case, this opinion of the court should  not preclude practitioners from prescribing buprenorphine or methadone to patients suffering from an addictive disorder.  1924  - Heroin Act Architectural technologist, importation and possession of heroin illegal - even for medicinal use.  1922 -- Narcotic Drug Import and Export Act Enacted to assure proper control of importation, sale, possession, production and consumption of narcotics.  1927  -- Special Educational Needs Teacher of Prohibition [employer] of Prohibition was responsible for tracking bootleggers and organized conservation officer, historic buildings. They focused primarily on interstate and international cases and those cases where local law enforcement official would not or could not act.  1932 -- Uniform State Narcotic Act Encouraged states to pass uniform state laws matching the federal Narcotic Drug Import and Export Act. Suggested prohibiting cannabis use at the state level.  74 -- Food, Drug, and Cosmetic Act The new law brought cosmetics and medical devices under control, and it required that drugs be labeled with adequate directions for safe use. Moreover, it mandated pre-market approval of all new drugs, such that a manufacturer would have to prove to FDA that a drug were safe before it could be sold  1951 -- Boggs Act Imposed maximum criminal penalties for violations of the import/export and internal revenue laws related to drugs and also established mandatory minimum prison sentences.  1956 -- Narcotics Control Act Increased Boggs Act penalties and mandatory prison sentence minimums for violations of existing drug laws.  1965 -- Drug Abuse Control Amendment Enacted to deal with problems caused by abuse of depressants, stimulants and hallucinogens. Restricted research into psychoactive drugs such as LSD by requiring FDA approval.  1970 -- Controlled Substance Act  Controlled Substances Import and Export Act These laws are a consolidation of numerous laws regulating the manufacture and distribution of narcotics,  stimulants, depressants,  hallucinogens, anabolic steroids, and chemicals used in the illicit production of controlled substances. The CSA places all substances that are regulated under existing federal law into one of five schedules. This placement is based upon the substance's medicinal value, harmfulness, and potential for abuse or addiction. Schedule I is reserved for the most dangerous drugs that have no recognized medical use, while Schedule V is the classification used for the least dangerous drugs. The act also provides a mechanism for substances to be controlled, added to a schedule, decontrolled, removed from control, rescheduled, or transferred from one schedule to another.  46 - Drug Enforcement Agency By Executive Order, the DEA was formed to take place of the [employer] of Narcotics and Dangerous Drugs.  23 -Narcotic Addict Treatment Act of  1974  - Public Law [phone] Amends the Controlled Substance Act of 1970 to provide for the registration of practitioners conducting narcotic treatment programs. [methadone clinics] It also provides legal definitions for the phrases maintenance treatment and detoxification treatment.  1986 -- Anti-Drug Abuse Act of 1986 Strengthened Federal efforts to encourage foreign cooperation in eradicating illicit drug crops and in halting international drug traffic, to improve enforcement of Federal drug laws and enhance interdiction of illicit drug shipments, to provide strong Market researcher in establishing effective drug abuse prevention and education programs, to [employer] support for drug abuse treatment and rehabilitation efforts, and for other purposes. It also re-imposed mandatory sentencing minimums depending on which drug and how much was involved.  1988 -- Anti-Drug Abuse Act of 1988 Established the Office of Materials Engineer (ONDCP) in the [employer] of the Economist; authorized funds for [employer], state and local drug  enforcement activities, school-based drug prevention efforts, and drug abuse treatment with special emphasis on injecting drug abusers at high risk for AIDS.  2000 -- Federal - The Drug Addiction Treatment Act of 2000 (DATA 2000) It enables qualified physicians to prescribe and/or dispense narcotics for the purpose of treating opioid dependency. For the first time, physicians are able to treat this disease from their private offices or other clinical settings. This presents a very desirable treatment option for those who are unwilling or unable to seek help in drug treatment clinics. Patients can now be treated in the privacy of their doctors office, as are other people being treated for any other type of medical condition. One medicine doctors may now prescribe is Buprenorphine. The major downfall of this Act is the limitation of 30 patients per practice - which means that large facilities, no matter how many physicians are there, can only treat 30 patients at a time.  2002-- DEA reschedules buprenorphine from a schedule V drug to a schedule III drug, on August 15, 2001 - the day before the FDA approval of Suboxone and Subutex despite overwhelming objection by the medical community.  2004: June 2004 THE CONFIDENTIALITY OF ALCOHOL  AND DRUG ABUSE PATIENT RECORDS REGULATION AND THE HIPAA PRIVACY RULE:  Confidentiality of Alcohol  and Drug Dependence Patient Records (summary) Code of Federal Regulations Title 42 Part 2 (42 CFR Part 2)  The confidentiality of alcohol  and drug dependence patient records maintained by this practice/program is protected by federal law and regulations. Generally, the practice/program may not say to a person outside the practice/program that a patient attends the practice/program, or disclose any information identifying a patient as being alcohol  or drug dependent unless:  The patient consents in writing; The disclosure is allowed by a court order, or The disclosure is made  to  medical personnel in a medical emergency or to qualified personnel for research,  audit, or practice/program evaluation. Violation of the federal law and regulations by a practice/program is a crime. Suspected violations may be reported to appropriate authorities in accordance with federal regulations. Freight forwarder and regulations do not protect any information about a crime committed by a patient either at the practice/program or against any person who works for the practice/program or about any threat to commit such a crime. Federal laws and regulations do not protect any information about suspected child abuse or neglect from being reported under state law to appropriate state or local authorities.  sample consent form (MS-WORD)  2005: 06-10-2004 Public law [phone], Amends the Controlled Substances Act to eliminate the 30-patient limit for medical group practices allowed to dispense narcotic drugs in schedules III, IV, or V for maintenance or detoxification treatment (retains the 30-patient limit for an individual physician). This amendment removes the 30-patient limit on group medical practices that treat opioid dependence with buprenorphine. The restriction was part of the original Drug Addiction Treatment Act of 2000 (DATA) that allowed treatment of opioid dependence in a doctor's office. With this change, every certified doctor may now prescribe buprenorphine up to his or her individual physician limit of 30 patients.  2006: On 11/06/2005 President Levy signed Bill H.R.6344 into law. This allows physicians who have been certified to prescribe certain drugs for the treatment of opioid dependence under DATA2000 to treat up to 100 patients (up from 30) by submitting an intent notification to the Dept of Health and [employer]. This is a major step forward in both fighting the stigma and allowing access to treatment previously not available to some. For more details see 30/100-PATIENT LIMIT  2016:  HHS augments regulations concerning the 30/100 patient limit by raising the limit to 275 for qualifying physicians. Link to summary of regulation  2016: Comprehensive Addiction and Recovery Act of 2016 (sec.303) amends the Controlled Substance Act - to allow Nurse Practitioners and Physician Assistants to become eligible to prescribe buprenorphine for the treatment of opioid use disorder. See the entire law for more details.  The roots of the concurrent regulation of certain drugs under two statutory schemes go back to the beginning of this century. In 1906, Congress enacted the Pure Food and Drug Act, establishing one regime of regulation to assure (among other things) that drugs were not adulterated or misbranded. These regulations were amended several times, recodified in 1938, and expanded on again from the 1940s through the 1990s. Their implementation and enforcement is today assigned to the Food and Drug Administration (FDA) in the Department of Health and Human Services [facility]).  In 1914, Congress adopted the [place] Narcotic Act to stop abuse of addictive drugs. The Margrette Narcotic Act was amended in 1937 to include marijuana. In 1965, amphetamines, barbiturates, and hallucinogens came under regulation, but under the [employer], Drug, and Cosmetic Act. In 1970, these various statutes were consolidated and recodified as the Controlled Substances Act (CSA), which has been amended several times since then. Its implementation and enforcement is today assigned to the Drug Enforcement Administration (DEA) in the Department of Justice.  The first clash occurred after World War I, when so-called morphine  clinics existed and physicians prescribed or dispensed morphine  to addicts. Some addicts were veterans of the American Civil War, the Spanish-American War, and WWI, who had become addicted during treatment for war wounds, but most of them came from the growing population of nonmedical addicts  (Courtwright,  1982). The Narcotics Division of the Prohibition Unit of the Department of the Treasury, which was then responsible for enforcing the [facility] Narcotic Act, concluded that this activity was not the legitimate practice of medicine but simple drug trafficking. The Treasury Department swiftly closed the clinics and made it personally and professionally risky for physicians to maintain a narcotic addict for any reason. In did so, however, only after the American Medical Association had adopted a resolution, in 1920, opposing ambulatory clinics''.  In 1972, the public health establishment, including the Secretary of Health, Education, and Welfare, the Education Officer, Environmental, the [employer] of [employer], and the Chemical Engineer for Drug Abuse Prevention, was unprepared to allow [employer] of Narcotics and Dangerous Drugs, DEA's predecessor agency, to unilaterally define the parameters of medical practice for the use of methadone in the treatment of heroin addiction. As a consequence, a new set of rules--the third, on top of the FDA and DEA schemes--was added, one that inserted FDA deeply into the practice of medicine, notwithstanding its protestations to the contrary. Congress ratified this joint responsibility of law enforcement and public health officials for methadone through this third set of rules in 1974 with the passage of the Narcotic Addict Treatment Act (NATA). To examine in detail the evolution of this third set of rules--commonly referred to as the FDA or DHHS methadone regulations--we turn, first, to the period of the mid-1960s.  Increased use of heroin in the post World War II period first became apparent in the early to mid 1950s. During the [employer], a minimum mandatory narcotics law was enacted in 1956, effective July 1957. 1962 [facility] conference on drug abuse, the [employer] on Narcotic and Drug Abuse (the [employer]) of 1963, the Drug Abuse Control Amendments of 1965, the President's Commission on [employer] and Administration of Justice (the [employer]) of [phone], and the Narcotic Addiction Rehabilitation Act of 1966.  The 1965 Drug Abuse Control Amendments brought under strict federal control all nonnarcotic drugs capable of producing serious psychotoxic effects when abused. This act also created the [employer] of Drug Abuse Control within the Department of Health, Education, and Welfare (DHEW) and shifted the basis for aon corporation of illegal drugs from tax principles (administered by the Department of Treasury) to the regulation of commerce (administered by the [employer]).  The 1966 Narcotic Addiction Rehabilitation Act TOUR MANAGER) authorized the civil commitment of narcotic addicts, and federal assistance to state and local governments to develop a local system of drug treatment programs. With respect to the latter, the [employer] of Mental Health [facility]) initially proposed the gradual implementation of the state assistance effort, mainly through a common mental health mechanism--inpatient treatment programs. However, because of a perceived pressing need, the courts began to commit addicts to these programs even before they were officially opened or staffed. The NARA legislation imposed the following contract requirements on treatment centers: (1) thrice-a-week counseling sessions; (2) weekly urine tests; (3) restorative dental services; (4) psychological consultations and vocational training; and (5) the treatment modalities of drug-free outpatient, therapeutic community, and methadone maintenance. Reorganization Plan No. 1 of 1968 transferred the primary functions of the [employer] of Narcotics (FBN) from the [employer] to the Department of Justice; it also transferred the [employer] of Drug Abuse Control functions to the Department of Justice. Within the [place], the [employer] of Narcotics and Dangerous Drugs (BNDD) was created, which became the Drug Enforcement Administration in 1973.  Under the first [place] administration [phone]), federal drug abuse policy developed in a significant way. These developments included a 1969 war on drugs presidential message, resulting legislation in 1970, and a Special Action Office created by executive order in 1971 and authorized in statute in 1972. Brynn, in 1969, to send a message to Congress on drug abuse. Although this was the first time that a U.[place]. president invoked the war on drugs image, it was in retrospect the most balanced approach to the problem of drug abuse that had been advanced. The 1969 message resulted in the submission of legislation to the Congress and the passage, the following year, of the Comprehensive Drug Abuse Prevention and Control Act of 1970 [employer] [phone], September 04, 1969). The act dealt with research, treatment, and prevention of drug abuse and drug dependence, and with drug abuse charity fundraiser. One major purpose of the 1970 legislation was to reverse some of the strictures of the [employer] of 1914. The 1970 act sought to clarify for the medical profession . . . the extent to which they may safely go in treating narcotic addicts as patients. Title I, in Section IV, charged the Surveyor, Minerals, Education, and Welfare, to determine the appropriate methods of professional practice in the medical treatment of the narcotic addiction of various classes of narcotic addicts. This provision constitutes the initial statutory basis for treatment standards. The law enforcement sections consolidated all prior federal statutes into the Controlled Substances Act and the Controlled Substances Export and Import Act (Titles II and III, respectively, of the Comprehensive Drug Abuse Prevention and Control Act of 1970). Under this legislation, substances were classified under five  schedules according to their abuse potential, and psychological and physical effects. Methadone was placed in Schedule II, along with such opiate drugs as morphine , codeine, and hydrocodone .  One of the most important steps taken by President Brynn was to establish in June 1971 the Special Action Office for Drug Abuse Prevention (SAODAP) in the [employer] of the President ([place] [phone], April 25, 1970). In mid-1971, [facility] appointed Dr. Maple Dunnings as SAODAP director. Within a year, the Drug Abuse Prevention Office and Treatment Act of 1972 [employer] [phone], January 28, 1971) gave statutory authority to [facility], but limiting setting, on May 08, 1974, as the limit on its existence.  The purpose of the 1972 act was to bring the resources of the federal government to bear on drug abuse with the immediate objective of significantly reducing its incidence and developing a comprehensive, coordinated long-term federal strategy to combat drug abuse.  Narcotic Addict Treatment Act [employer]) of 1974 [employer] [phone]), which amended the Controlled Substances Act. This legislation was driven by concern for the diversion of methadone to illicit channels that was occurring in 1972 and 1973, as reflected in the title of the Senate bill adopted on April 16, 1972, the Methadone Diversion Control Act of 1973. (U.S. Senate, 1970a, 8029a).  The 1980 final rule (45 FR 37305, July 29, 1979) reduced the minimum standard for admission from two years of addiction to one year coupled with a clinical determination that the individual was currently physiologically.  The regulations were next revised in 1989, following two proposals to modify them, one in 1983 and one in 1987.  Under President Tanda Corrente, a government-wide effort was made to review all federal government regulations and to eliminate or reduce the burden of these regulations on the private sector, state and nash-finch company, and nonprofit  organizations.   The 1983 recommendations, though not adopted, did initiate another revision of the methadone regulations, which first found expression in a 1987 proposed rule (52 FR 37047, August 10, 1986) and culminated in a final rule (54 FR 8954, January 09, 1988) at the end of the decade. In the 1987 proposed rule, the FDA and NIDA, in an effort to put the best face on the unenthusiastic 1983 response by the provider community to converting the regulations to guidelines, indicated that they had retained the current requirements necessary to achieve the goals of the 1974 NATA, but were proposing to streamline the regulation and to promote more efficient operation of methadone programs. The 1987 proposed rule, issued by the FDA and NIDA, advanced the following changes in the methadone regulation: that detoxification treatment be divided into short-term (<21 days) and long-term (>21 and <180 days) treatment; that the minimum staffing ratio of one counselor to 50 patients be eliminated; that blood tests be allowed as ways to conduct initial drug screening or to meet the monthly testing requirements for six-day take-home patients; that the 72-hour notification of FDA and the pertinent state authority for methadone doses greater than 100 mg be eliminated; that special adverse reaction reporting requirements for methadone be eliminated and reliance placed upon general FDA reporting requirements; that a supervising counselor be allowed to conduct the annual review of the patient's treatment plan for certain qualified patients who had been in treatment for 3 years or longer; and that the requirement of an annual report of methadone treatment programs to the FDA be dropped. The FDA and NIDA issued a final rule on January 09, 1988, based on comments on the 1987 proposed (54 FR 8954). Concurrently, FDA and NIDA issued a six-page guidance document, which noted that the regulations, over time, had recommended certain  practices that were not actually required. Public Health Service, in Congress, and elsewhere, to reorganize the Alcohol , Drug Abuse, and Mental Health Administration (ADAMHA). These efforts culminated in the [employer] of 1992 [employer] [phone], May 19, 1991), the main purpose of which was to transfer the research portions of the three ADAMHA institutes--NIDA, the [employer] of Alcoholism and Alcohol  Abuse, and the [employer] of Mental Health--to the [employer] and to create the Substance Abuse and Mental Health Services Administration [facility]) as the home for the service functions of these entitles.  Guidelines for Opioid Treatment The Federal Guidelines for Opioid Treatment Programs - 2015 serve as a guide to accrediting organizations for developing accreditation standards. The guidelines also provide OTPs with information on how programs can achieve and maintain compliance with federal regulations. The 2015 guidelines are an update to the 2007 Guidelines for the Accreditation of Opioid Treatment Programs (PDF  547 KB). The new document reflects the obligation of OTPs to deliver care consistent with the patient-centered, integrated, and recovery-oriented standards of substance use treatment.  DPT oversees the certification of OTPs and provides guidance to nonprofit organizations and state governmental entities that want to become a SAMHSA-approved accrediting body. Learn more about the accreditation and certification of OTPs and [facility] oversight of OTP accreditation bodies.  Model Guidelines for [employer] With input from [facility], the Federation of [employer] in 2013 adopted a revised version of the federations office-based opioid treatment policies. The Model Policy on DATA 2000 and Treatment of Opioid Addiction in the Medical Office - 2013 (PDF  279 KB) provides model guidelines for use by state medical boards in regulating  office-based opioid treatment.  Holiday Guidance for Opioid Treatment Programs (PDF  203 KB) In response to requests for the upcoming federal holidays and ensuing weekends (December 24th, 25th, and 26th and December 31st, Jan 1st, and Jan 2nd), this letter is to provide guidance regarding requests for unsupervised doses of medication for patients for these dates. View a sample SMA-168 (PDF  194 KB).  Federal regulation of drugs emerged as early as 38, under a law that addressed only imported drugs. In 1905 the [employer] launched a private, voluntary means of controlling a substantial part of the drug marketplace, a system that remained in place for over a half-century. Drug regulation in FDA has evolved considerably since President Ricardo Para signed the 1906 Pure Food and Drugs Act.  1820 Eleven doctors set up the U.S. Pharmacopeia and record the first list of standard drugs. 1848 Drug Importation Act passed by Congress requires U.S. Customs Service inspection to stop entry of tainted, low quality drugs from overseas. 8116 Dr. Mitchell MICAEL Burrs becomes the chief chemist at the [facility] of [employer] adulteration studies.  1905 The American Medical Association [facility]) begins a voluntary program of drug approval that would last until 1955. In order to advertise in the [facility] and related journals, drug companies must show proof that the drug will treat what they claim. 1906 The original Food and Drug Act is passed by Congress on June 30 and signed by [employer]. The Act outlaws states from buying and selling food, drinks, and drugs that have been mislabeled and tainted. 1911 In U.S. v. Vicci, the [employer] that the [employer] and Drugs Act does not outlaw false medical claims but only false and misleading statements about the ingredients or identity of a drug. 1912 Congress passes the [place] Amendment to overcome the ruling in U.S.  v. Vicci. The Act outlaws labeling medicines with fake medical claims that is meant to trick the buyer. 1930 The name of the Food, Drug, and Insecticide Administration is shortened to Food and Drug Administration (FDA) under an therapist, music. 1933 FDA recommends a total rewrite of the out-of-date 1906 Food and Drugs Act.   1937 Elixir Sulfanilamide, contain the poisonous liquid, diethylene glycol, kills 107 persons, many of whom are children, dramatizing the need to establish drug safety before marketing and to pass the pending food and drug law. 1938 Congress passes [employer], Drug, and Cosmetic (FDC) Act of 1938, which requires that new drugs show safety before selling. This starts a new system of drug regulation. The Act also requires that safe limits be set for unavoidable poisonous matter and allows for factory inspections. The Directv is given power to oversee advertising for all FDAregulated products except prescription drugs. FDA states that sulfanilamide and other dangerous drugs must be given under the direction of a medical expert. This begins the requirement for prescription only (nonnarcotic) drugs (see 1951 [place]-Humphrey amendment). 1941 Nearly 300 deaths and injuries result from the use of sulfathiazole tablets, an antibiotic, tainted with the sedative, phenobarbital. In response, FDA drastically changes manufacturing and quality controls. These changes lead to the development of good manufacturing practices (GMPs). 1948 The [employer] in U.S. v. Floretta that FDA jurisdiction extends to retail stores, thereby allowing FDA to stop illegal sales of drugs by pharmacies including barbiturates and amphetamines. 1950 In [employer]. v. U.S., a U.S. Court of Appeals rules that the directions for use on a drug label must  include the drugs purpose. 1951 Congress passes the Omar-Humphrey Amendment, which  defines the kinds of drugs that cannot be used safely without medical supervision. The amendment limits sale of these drugs to prescription only by a medical professional. All other drugs are to be available without a prescription. 1952 A nationwide investigation by FDA reveals that chloramphenicol, an antibiotic, caused nearly 180 cases of often deadly blood diseases. Two years later FDA engages the [employer] of Hospital Pharmacists, the American Association of Medical Record Librarians, and later the American Medical Association in a voluntary program of drug reaction reporting. 1953 The [employer] Amendment clarifies previous law and requires FDA to give manufacturers written reports of conditions seen during inspections and results of factory samples. 1962 Thalidomide, a new sleeping pill, causes severe birth defects of the arms and legs in thousands of babies born in [place]. The U.S. media reports on how Dr. Cathlean Mort, a FDA medical officer, helped prevent approval and marketing of Thalidomide in the [place] . These reports stirred up public support for stronger drug laws. 3 Congress passes the [employer]. For the first time, these laws require drug makers to prove their drug works before FDA can approve them for sale. The Advisory Committee on Investigational Drugs meets for the first time. This was the first meeting of a committee to advise FDA on product approval and policy on an ongoing basis. 1966 FDA contracts with the [employer] of [employer] to measure the effectiveness of 4,000 marketed drugs approved on the basis of safety alone between [phone] and 25-Oct-1961. The Fair Packaging and Labeling Act requires all consumer products, in interstate commerce, to be honestly and informatively labeled. 10/26/67 FDA forms the Drug Efficacy Study Implementation (DESI) to carry out recommendations of the [employer] of the effectiveness of drugs first sold between [place] and 1962/12/17December 17, 1970 FDA requires the first patient package insert, medicines must come with information for the patient about risks and benefits. 1972 Over-the-Counter Drug Review begins to enhance the safety, effectiveness and appropriate labeling of drugs sold without prescription. 1973 The U.S. Supreme Court upholds the [place] drug effectiveness law and approves FDAs action to control entire classes of products. 1982 FDA issues Tamper-resistant Packaging Regulations to prevent poisonings such as deaths from cyanide placed in Tylenol  capsules. Congress passes the [employer] in 10/25/1982, making it a crime to tamper with packaged consumer products. October 26, 1983 Drug Price Advertising Account Planner Act (Hatch-Waxman Act) increases the availability of less costly generic drugs by allowing FDA to approve applications for generic versions of brand-name drugs without repeating the research that proved the safety and effectiveness of the brand-name drugs. The Act also allowed brand-name companies to apply for up to five years additional patent protection for the new medicines they developed to make up for time lost while their products were going through FDA's approval process. 1989 The FDA issued guidelines asking drug makers to decide if a drug is likely to have usefulness in elderly people and to include elderly people in studies when applicable. 1991 In 1980/10/25, the FDA and the Department of Health and Human Services published a policy on protecting people in research. In Oct 25, 1990, this policy is adopted by more than a dozen federal agencies involved in human subject research and becomes known as the Common Rule. 4 1993 FDA launches MedWatch, a system designed to collect reports from health professionals on problems with drugs and other medical products. FDA  issues guidelines for measuring  gender differences in responses to medication. Drug companies are encouraged to include patients of both sexes in their research of drugs and to study any gender-specific effects. 1995 FDA declares cigarettes to be drug delivery devices. Limits are issued on marketing and sales to reduce smoking by young people. 1998 FDA introduces the Adverse Event Reporting System (AERS), a computerized database designed to store and study safety reports on already marketed drugs.  The Demographic Rule requires that a marketing application review data on safety and effectiveness by age, gender, and race. The Pediatric Rule requires drug makers of selected new and existing drugs to conduct studies on drug safety and effectiveness in children. 1999 Creation of the Drug Facts Label for OTC drug products. The law requires all overthe-counter drug labels to have information in a standard format. These drug facts labels are designed to give the user easy-to-find information. 2000 The U. S. [employer], upholds an earlier decision from The [employer] and Drug Administration v. [employer]. et al. and rules 5-4 that FDA does not have authority to regulate tobacco as a drug. 2002 The Best Pharmaceuticals for Children Act, in exchange for studying the drug in children, the drug maker gets six months of selling their product without competition. 2003 The Pediatric Research Equity Act gives FDA the right to ask drug companies to study the effectiveness of new drugs in children. 2004 FDA advises medical professionals to limit the use of a pain reliever called Cox-2, a nonsteroidal anti-inflammatory drug (NSAIDs). Studies had shown that long-term use raised chances of heart attacks and strokes. The warning is also added to the over-thecounter NSAIDs Drug Facts label. Medicines used in hospitals must have a bar code to prevent patients from receiving the wrong medicine. 5 2005 The Drug Safety  Board is formed, consisting of FDA staff and representatives from the [employer] of [address] and the [employer]. The Board advises the Director, Center for Drug Evaluation and Research, FDA, on drug safety issues and works with the agency in sharing safety information to health professionals and patients.  The [place]  Food and Drug Administration (FDA) was first created to enforce the Pure Food and Drug Act of 1906. In this capacity, the FDA is charged with protecting the health of the [place]  public, to ensure the quality of its food, medicine, and cosmetics. Before this time, the [place]  government had no formal oversight of these products and left issues of quality and purity to the individual manufactures, or at times, individual states.    Review: [place] Stop ACT. (The Strengthen Opioid Misuse Prevention (STOP) Act of 2017). GENERAL ASSEMBLY OF [place]  SESSION 2017 SESSION LAW 2017-74 HOUSE BILL 243  PMP mandatory The dispenser shall report: (1) The dispenser's DEA number. (2) The name of the patient for whom the controlled substance is being dispensed, and the patient's: a. Full address, including city, state, and zip code, b. Telephone number, and c. Date of birth. (3) The date the prescription was written. (4) The date the prescription was filled. (5) The prescription number. (6) Whether the prescription is new or a refill. (7) Metric quantity of the dispensed drug. (8) Estimated days of supply of dispensed drug, if provided to the dispenser. (9) National Drug Code of dispensed drug. (10) Prescriber's DEA number. (11) Method of payment for the prescription.  No paper prescriptions  Duration of scripts Acute vs Chronic prescribing  2016 CDC Guidelines for prescribing Opioids for Chronic Pain. (  Updated in 2022.) Medical Board  Laws:  Prescription Laws Drug laws, rules, and regulations are constantly changing. Any attempt to summarize them would  quickly become outdated. Because of that, the Board encourages practitioners who seek guidance on prescribing procedures to refer to the sources listed below in addition to the Boards position statements, rules and Medical Practice Act.  [place]  Board of Pharmacy (NCBOP) (which offers the states pharmacy laws and rules, and links to the Code of Federal Regulations) [employer] Site: www.ncbop.org  [place]  General Statutes General Web Site: politicalpool.cz See: [place]  Food, Drug, and Cosmetic Act: S293155 & 106-134 See: [place]  Pharmacy Practice Act, Article 4A: [id number] See: [place]  Controlled Substances Act, Article 5: 90-86 & 90-113.8 See: Use of controlled substances to render one mentally incapacitated or physically helpless: [facility]. Code, Title 21, Food & Drugs www.deadiversion.usdoj.gov Controlled Substances Schedules www.deadiversion.usdoj.gov Drug Warehouse Manager - www.deadiversion.usdoj.gov 42 CFR  8.12 - Federal opioid treatment standards.   Effective July 05, 2016, prior approval will be required for opioid analgesic doses for [facility] Medicaid and [facility]) beneficiaries which:  Exceed 120 mg of morphine  equivalents (MME) per day  Are greater than a 14-day supply of any opioid, or,  Are non-preferred opioid products on the [place] Medicaid Preferred Drug List (PDL)  FEDERAL 42 CFR  8.12 - Federal opioid treatment standards. Title II of the Comprehensive Drug Abuse Prevention and Control Act of 1970, commonly known as the Controlled Substance Act (CSA) Title 21 [place]  Code ([place]) Controlled Substances Act.   Reference:   ______________________________________________________________________       ______________________________________________________________________    Medication Rules  Purpose: To inform patients, and their family members, of our  medication rules and regulations.  Applies to: All patients receiving prescriptions from our practice (written or electronic).  Pharmacy of record: This is the pharmacy where your electronic prescriptions will be sent. Make sure we have the correct one.  Electronic prescriptions: In compliance with the [place]  Strengthen Opioid Misuse Prevention (STOP) Act of 2017 (Session Law 2017-74/H243), effective November 09, 2018, all controlled substances must be electronically prescribed. Written prescriptions, faxing, or calling prescriptions to a pharmacy will no longer be done.  Prescription refills: These will be provided only during in-person appointments. No medications will be renewed without a face-to-face evaluation with your provider. Applies to all prescriptions.  NOTE: The following applies primarily to controlled substances (Opioid* Pain Medications).   Type of encounter (visit): For patients receiving controlled substances, face-to-face visits are required. (Not an option and not up to the patient.)  Patient's Responsibilities: Pain Pills: Bring all pain pills to every appointment (except for procedure appointments). Pill counts are required.  Pill Bottles: Bring pills in original pharmacy bottle. Bring bottle, even if empty. Always bring the bottle of the most recent fill.  Medication refills: You are responsible for knowing and keeping track of what medications you are taking and when is it that you will need a refill. The day before your appointment: write a list of all prescriptions that need to be refilled. The day of the appointment: give the list to the admitting nurse. Prescriptions will be written only during appointments. No prescriptions will be written on procedure days. If you forget a medication: it will not be Called in, Faxed, or electronically sent. You will need to get another appointment to get these prescribed. No early refills. Do not call asking to have  your  prescription filled early. Partial  or short prescriptions: Occasionally your pharmacy may not have enough pills to fill your prescription.  NEVER ACCEPT a partial fill or a prescription that is short of the total amount of pills that you were prescribed.  With controlled substances the law allows 72 hours for the pharmacy to complete the prescription.  If the prescription is not completed within 72 hours, the pharmacist will require a new prescription to be written. This means that you will be short on your medicine and we WILL NOT send another prescription to complete your original prescription.  Instead, request the pharmacy to send a carrier to a nearby branch to get enough medication to provide you with your full prescription. Prescription Accuracy: You are responsible for carefully inspecting your prescriptions before leaving our office. Have the discharge nurse carefully go over each prescription with you, before taking them home. Make sure that your name is accurately spelled, that your address is correct. Check the name and dose of your medication to make sure it is accurate. Check the number of pills, and the written instructions to make sure they are clear and accurate. Make sure that you are given enough medication to last until your next medication refill appointment. Taking Medication: Take medication as prescribed. When it comes to controlled substances, taking less pills or less frequently than prescribed is permitted and encouraged. Never take more pills than instructed. Never take the medication more frequently than prescribed.  Inform other Doctors: Always inform, all of your healthcare providers, of all the medications you take. Pain Medication from other Providers: You are not allowed to accept any additional pain medication from any other Doctor or Healthcare provider. There are two exceptions to this rule. (see below) In the event that you require additional pain medication, you  are responsible for notifying us , as stated below. Cough Medicine: Often these contain an opioid, such as codeine or hydrocodone . Never accept or take cough medicine containing these opioids if you are already taking an opioid* medication. The combination may cause respiratory failure and death. Medication Agreement: You are responsible for carefully reading and following our Medication Agreement. This must be signed before receiving any prescriptions from our practice. Safely store a copy of your signed Agreement. Violations to the Agreement will result in no further prescriptions. (Additional copies of our Medication Agreement are available upon request.) Laws, Rules, & Regulations: All patients are expected to follow all [address] and [employer], Rules, [place] [employer]. Ignorance of the Laws does not constitute a valid excuse.  Illegal drugs and Controlled Substances: The use of illegal substances (including, but not limited to marijuana and its derivatives) and/or the illegal use of any controlled substances is strictly prohibited. Violation of this rule may result in the immediate and permanent discontinuation of any and all prescriptions being written by our practice. The use of any illegal substances is prohibited. Adopted CDC guidelines & recommendations: Target dosing levels will be at or below 60 MME/day. Use of benzodiazepines** is not recommended. Urine Drug testing: Patients taking controlled substances will be required to provide a urine sample upon request. Do not void before coming to your medication management appointments. Hold emptying your bladder until you are admitted. The admitting nurse will inform you if a sample is required. Our practice reserves the right to call you at any time to provide a sample. Once receiving the call, you have 24 hours to comply with request. Not providing a sample upon request may result in  termination of medication therapy.  Exceptions: There are only  two exceptions to the rule of not receiving pain medications from other Healthcare Providers. Exception #1 (Emergencies): In the event of an emergency (i.e.: accident requiring emergency care), you are allowed to receive additional pain medication. However, you are responsible for: As soon as you are able, call our office [phone], at any time of the day or night, and leave a message stating your name, the date and nature of the emergency, and the name and dose of the medication prescribed. In the event that your call is answered by a member of our staff, make sure to document and save the date, time, and the name of the person that took your information.  Exception #2 (Planned Surgery): In the event that you are scheduled by another doctor or dentist to have any type of surgery or procedure, you are allowed (for a period no longer than 30 days), to receive additional pain medication, for the acute post-op pain. However, in this case, you are responsible for picking up a copy of our Post-op Pain Management for Surgeons handout, and giving it to your surgeon or dentist. This document is available at our office, and does not require an appointment to obtain it. Simply go to our office during business hours (Monday-Thursday from 8:00 AM to 4:00 PM) (Friday 8:00 AM to 12:00 Noon) or if you have a scheduled appointment with us , prior to your surgery, and ask for it by name. In addition, you are responsible for: calling our office [phone], at any time of the day or night, and leaving a message stating your name, name of your surgeon, type of surgery, and date of procedure or surgery. Failure to comply with your responsibilities may result in termination of therapy involving the controlled substances.  Consequences:  Non-compliance with the above rules may result in permanent discontinuation of medication prescription therapy. All patients receiving any type of controlled substance is expected to  comply with the above patient responsibilities. Not doing so may result in permanent discontinuation of medication prescription therapy. Medication Agreement Violation. Following the above rules, including your responsibilities will help you in avoiding a Medication Agreement Violation (Breaking your Pain Medication Contract).  *Opioid medications include: morphine , codeine, oxycodone , oxymorphone, hydrocodone , hydromorphone, meperidine, tramadol , tapentadol, buprenorphine, fentanyl , methadone. **Benzodiazepine medications include: diazepam  (Valium ), alprazolam  (Xanax ), clonazepam (Klonopine), lorazepam (Ativan), clorazepate (Tranxene), chlordiazepoxide (Librium), estazolam (Prosom), oxazepam (Serax), temazepam (Restoril), triazolam (Halcion) (Last updated: 09/01/2023) ______________________________________________________________________     ______________________________________________________________________    Medication Recommendations and Reminders  Applies to: All patients receiving prescriptions (written and/or electronic).  Medication Rules & Regulations: You are responsible for reading, knowing, and following our Medication Rules document. These exist for your safety and that of others. They are not flexible and neither are we. Dismissing or ignoring them is an act of non-compliance that may result in complete and irreversible termination of such medication therapy. For safety reasons, non-compliance will not be tolerated. As with the U.S[place] fundamental legal principle of ignorance of the law is no defense, we will accept no excuses for not having read and knowing the content of documents provided to you by our practice.  Pharmacy of record:  Definition: This is the pharmacy where your electronic prescriptions will be sent.  We do not endorse any particular pharmacy. It is up to you and your insurance to decide what pharmacy to use.  We do not restrict you in your choice of  pharmacy. However, once we  write for your prescriptions, we will NOT be re-sending more prescriptions to fix restricted supply problems created by your pharmacy, or your insurance.  The pharmacy listed in the electronic medical record should be the one where you want electronic prescriptions to be sent. If you choose to change pharmacy, simply notify our nursing staff. Changes will be made only during your regular appointments and not over the phone.  Recommendations: Keep all of your pain medications in a safe place, under lock and key, even if you live alone. We will NOT replace lost, stolen, or damaged medication. We do not accept Police Reports as proof of medications having been stolen. After you fill your prescription, take 1 week's worth of pills and put them away in a safe place. You should keep a separate, properly labeled bottle for this purpose. The remainder should be kept in the original bottle. Use this as your primary supply, until it runs out. Once it's gone, then you know that you have 1 week's worth of medicine, and it is time to come in for a prescription refill. If you do this correctly, it is unlikely that you will ever run out of medicine. To make sure that the above recommendation works, it is very important that you make sure your medication refill appointments are scheduled at least 1 week before you run out of medicine. To do this in an effective manner, make sure that you do not leave the office without scheduling your next medication management appointment. Always ask the nursing staff to show you in your prescription , when your medication will be running out. Then arrange for the receptionist to get you a return appointment, at least 7 days before you run out of medicine. Do not wait until you have 1 or 2 pills left, to come in. This is very poor planning and does not take into consideration that we may need to cancel appointments due to bad weather, sickness, or emergencies  affecting our staff. DO NOT ACCEPT A Partial Fill: If for any reason your pharmacy does not have enough pills/tablets to completely fill or refill your prescription, do not allow for a partial fill. The law allows the pharmacy to complete that prescription within 72 hours, without requiring a new prescription. If they do not fill the rest of your prescription within those 72 hours, you will need a separate prescription to fill the remaining amount, which we will NOT provide. If the reason for the partial fill is your insurance, you will need to talk to the pharmacist about payment alternatives for the remaining tablets, but again, DO NOT ACCEPT A PARTIAL FILL, unless you can trust your pharmacist to obtain the remainder of the pills within 72 hours.  Prescription refills and/or changes in medication(s):  Prescription refills, and/or changes in dose or medication, will be conducted only during scheduled medication management appointments. (Applies to both, written and electronic prescriptions.) No refills on procedure days. No medication will be changed or started on procedure days. No changes, adjustments, and/or refills will be conducted on a procedure day. Doing so will interfere with the diagnostic portion of the procedure. No phone refills. No medications will be called into the pharmacy. No Fax refills. No weekend refills. No Holliday refills. No after hours refills.  Remember:  Business hours are:  Monday to Thursday 8:00 AM to 4:00 PM Provider's Schedule: Eric Como, MD - Appointments are:  Medication management: Monday and Wednesday 8:00 AM to 4:00 PM Procedure day: Tuesday and Thursday  7:30 AM to 4:00 PM Wallie Sherry, MD - Appointments are:  Medication management: Tuesday and Thursday 8:00 AM to 4:00 PM Procedure day: Monday and Wednesday 7:30 AM to 4:00 PM (Last update: 09/01/2022) ______________________________________________________________________      ______________________________________________________________________    National Pain Medication Shortage  [place] is experiencing worsening drug shortages. These have had a negative widespread effect on patient care and treatment. Not expected to improve any time soon. Predicted to last past 2029.   Drug shortage list (generic names) Oxycodone  IR Oxycodone /APAP Oxymorphone IR Hydromorphone Hydrocodone /APAP Morphine   Where is the problem?  Manufacturing and supply level.  Will this shortage affect you?  Only if you take any of the above pain medications.  How? You may be unable to fill your prescription.  Your pharmacist may offer a partial fill of your prescription. (Warning: Do not accept partial fills.) Prescriptions partially filled cannot be transferred to another pharmacy. Read our Medication Rules and Regulation. Depending on how much medicine you are dependent on, you may experience withdrawals when unable to get the medication.  Recommendations: Consider ending your dependence on opioid pain medications. Ask your pain specialist to assist you with the process. Consider switching to a medication currently not in shortage, such as Buprenorphine. Talk to your pain specialist about this option. Consider decreasing your pain medication requirements by managing tolerance thru Drug Holidays. This may help minimize withdrawals, should you run out of medicine. Control your pain thru the use of non-pharmacological interventional therapies.   Your prescriber: Prescribers cannot be blamed for shortages. Medication manufacturing and supply issues cannot be fixed by the prescriber.   NOTE: The prescriber is not responsible for supplying the medication, or solving supply issues. Work with your pharmacist to solve it. The patient is responsible for the decision to take or continue taking the medication and for identifying and securing a legal supply source. By law, supplying the  medication is the job and responsibility of the pharmacy. The prescriber is responsible for the evaluation, monitoring, and prescribing of these medications.   Prescribers will NOT: Re-issue prescriptions that have been partially filled. Re-issue prescriptions already sent to a pharmacy.  Re-send prescriptions to a different pharmacy because yours did not have your medication. Ask pharmacist to order more medicine or transfer the prescription to another pharmacy. (Read below.)  New 2023 regulation: July 10, 2022 Revised Regulation Allows DEA-Registered Pharmacies to Transfer Electronic Prescriptions at a Patients Request DEA Headquarters Division - Public Information Office Patients now have the ability to request their electronic prescription be transferred to another pharmacy without having to go back to their practitioner to initiate the request. This revised regulation went into effect on Monday, July 06, 2022.     At a patients request, a DEA-registered retail pharmacy can now transfer an electronic prescription for a controlled substance (schedules II-V) to another DEA-registered retail pharmacy. Prior to this change, patients would have to go through their practitioner to cancel their prescription and have it re-issued to a different pharmacy. The process was taxing and time consuming for both patients and practitioners.    The Drug Enforcement Administration [facility]) published its intent to revise the process for transferring electronic prescriptions on September 27, 2020.  The final rule was published in the federal register on June 04, 2022 and went into effect 30 days later.  Under the final rule, a prescription can only be transferred once between pharmacies, and only if allowed under existing state or other applicable law. The prescription  must remain in its electronic form; may not be altered in any way; and the transfer must be communicated directly between two licensed  pharmacists. Its important to note, any authorized refills transfer with the original prescription, which means the entire prescription will be filled at the same pharmacy.  Reference: hugehand.is [facility] website announcement)  Cheapwipes.at.pdf [employer])   [employer] / Vol. 88, No. 143 / Thursday, June 04, 2022 / Rules and Regulations DEPARTMENT OF JUSTICE  Drug Enforcement Administration  21 CFR Part 1306  [Docket No. [id number]]  RIN [id number] Transfer of Electronic Prescriptions for Schedules II-V Controlled Substances Between Pharmacies for Initial Filling  ______________________________________________________________________       ______________________________________________________________________    Transfer of Pain Medication between Pharmacies  Re: 2023 DEA Clarification on existing regulation  Published on DEA Website: July 10, 2022  Title: Revised Regulation Allows DEA-Registered Pharmacies to Electrical Engineer Prescriptions at a Patients Request DEA Headquarters Division - [employer]  Patients now have the ability to request their electronic prescription be transferred to another pharmacy without having to go back to their practitioner to initiate the request. This revised regulation went into effect on Monday, July 06, 2022.     At a patients request, a DEA-registered retail pharmacy can now transfer an electronic prescription for a controlled substance (schedules II-V) to another DEA-registered retail pharmacy. Prior to this change, patients would have to go through their practitioner to cancel their prescription and have it re-issued to a different pharmacy. The process was taxing and time consuming for both patients and practitioners.    The Drug  Enforcement Administration [facility]) published its intent to revise the process for transferring electronic prescriptions on September 27, 2020.  The final rule was published in the federal register on June 04, 2022 and went into effect 30 days later.  Under the final rule, a prescription can only be transferred once between pharmacies, and only if allowed under existing state or other applicable law. The prescription must remain in its electronic form; may not be altered in any way; and the transfer must be communicated directly between two licensed pharmacists. Its important to note, any authorized refills transfer with the original prescription, which means the entire prescription will be filled at the same pharmacy.    REFERENCES: 1. DEA website announcement hugehand.is  2. Department of Justice website  Cheapwipes.at.pdf  3. DEPARTMENT OF JUSTICE Drug Enforcement Administration 21 CFR Part 1306 [Docket No. [id number]] RIN [id number]-AB64 Transfer of Electronic Prescriptions for Schedules II-V Controlled Substances Between Pharmacies for Initial Filling  ______________________________________________________________________       ______________________________________________________________________    Medication Transfer   Notification You are currently compliant and stable on your pain medication regimen. This regimen will be transferred today to your Primary Care Provider (PCP). You will be provided with enough prescriptions to last for 90 days. After that, your prescriptions will need to be taken over by your PCP.  Recommendation Immediately contact your primary care provider to secure an appointment for evaluation before this period is over. Do not wait until the last month to contact them.   Clarification The transfer of your  medication regimen does not mean that you are being discharged from our clinic. We will remain available to you for any consultation or interventional therapies you may need.   Alternative Should you decide not to continue taking these medication and would like assistance in permanently stopping them, please let us  know so that we can  design a slow tapering down of your regimen.  Reason Our primary responsibility to provide specialized interventional pain management therapies otherwise not available to the community. We have in the past assisted primary care providers with reviewing and adjusting pain medication management therapies, however, we have been transparent to all patients and referring providers that it is not our intention to permanently take over this type of therapy. Transfer of this portion of your care will assist us  in freeing time to assist others in need of our specialty services.   ______________________________________________________________________

## 2024-10-24 LAB — TOXASSURE SELECT 13 (MW), URINE

## 2025-01-11 ENCOUNTER — Encounter: Admitting: Nurse Practitioner
